# Patient Record
Sex: Male | Born: 1955 | Race: White | Hispanic: No | Marital: Married | Smoking: Never smoker
Health system: Southern US, Community
[De-identification: ages and names within clinical notes are randomized; demographics above are authoritative.]

## PROBLEM LIST (undated history)

## (undated) DIAGNOSIS — I77810 Thoracic aortic ectasia: Secondary | ICD-10-CM

## (undated) DIAGNOSIS — I7121 Aneurysm of the ascending aorta, without rupture: Secondary | ICD-10-CM

## (undated) DIAGNOSIS — H18519 Endothelial corneal dystrophy, unspecified eye: Secondary | ICD-10-CM

## (undated) DIAGNOSIS — F0390 Unspecified dementia without behavioral disturbance: Secondary | ICD-10-CM

## (undated) DIAGNOSIS — K759 Inflammatory liver disease, unspecified: Secondary | ICD-10-CM

## (undated) DIAGNOSIS — K219 Gastro-esophageal reflux disease without esophagitis: Secondary | ICD-10-CM

## (undated) DIAGNOSIS — Z8619 Personal history of other infectious and parasitic diseases: Secondary | ICD-10-CM

## (undated) DIAGNOSIS — B019 Varicella without complication: Secondary | ICD-10-CM

## (undated) DIAGNOSIS — H1851 Endothelial corneal dystrophy: Secondary | ICD-10-CM

## (undated) HISTORY — PX: NASAL SEPTUM SURGERY: SHX37

## (undated) HISTORY — DX: Thoracic aortic ectasia: I77.810

## (undated) HISTORY — DX: Gastro-esophageal reflux disease without esophagitis: K21.9

## (undated) HISTORY — DX: Personal history of other infectious and parasitic diseases: Z86.19

## (undated) HISTORY — DX: Inflammatory liver disease, unspecified: K75.9

## (undated) HISTORY — DX: Varicella without complication: B01.9

## (undated) HISTORY — PX: COLONOSCOPY: SHX174

## (undated) HISTORY — DX: Aneurysm of the ascending aorta, without rupture: I71.21

## (undated) HISTORY — DX: Endothelial corneal dystrophy, unspecified eye: H18.519

## (undated) HISTORY — DX: Endothelial corneal dystrophy: H18.51

---

## 1999-11-27 ENCOUNTER — Ambulatory Visit: Admission: RE | Admit: 1999-11-27 | Discharge: 1999-11-27 | Payer: Self-pay | Admitting: Otolaryngology

## 1999-11-27 ENCOUNTER — Encounter: Payer: Self-pay | Admitting: Pulmonary Disease

## 2000-07-02 ENCOUNTER — Encounter: Payer: Self-pay | Admitting: Pulmonary Disease

## 2004-12-10 ENCOUNTER — Ambulatory Visit: Payer: Self-pay | Admitting: Internal Medicine

## 2005-04-12 ENCOUNTER — Ambulatory Visit: Payer: Self-pay | Admitting: Internal Medicine

## 2005-04-24 ENCOUNTER — Ambulatory Visit: Payer: Self-pay | Admitting: Internal Medicine

## 2007-12-18 ENCOUNTER — Telehealth: Payer: Self-pay | Admitting: Internal Medicine

## 2008-01-09 ENCOUNTER — Encounter: Payer: Self-pay | Admitting: *Deleted

## 2008-01-09 DIAGNOSIS — G4733 Obstructive sleep apnea (adult) (pediatric): Secondary | ICD-10-CM | POA: Insufficient documentation

## 2008-01-09 DIAGNOSIS — K644 Residual hemorrhoidal skin tags: Secondary | ICD-10-CM | POA: Insufficient documentation

## 2008-01-09 DIAGNOSIS — J309 Allergic rhinitis, unspecified: Secondary | ICD-10-CM | POA: Insufficient documentation

## 2008-01-09 DIAGNOSIS — K219 Gastro-esophageal reflux disease without esophagitis: Secondary | ICD-10-CM | POA: Insufficient documentation

## 2009-02-09 ENCOUNTER — Ambulatory Visit: Payer: Self-pay | Admitting: Internal Medicine

## 2009-02-09 LAB — CONVERTED CEMR LAB
Basophils Absolute: 0 10*3/uL (ref 0.0–0.1)
CO2: 28 meq/L (ref 19–32)
Calcium: 9.4 mg/dL (ref 8.4–10.5)
Cholesterol: 185 mg/dL (ref 0–200)
Creatinine, Ser: 0.9 mg/dL (ref 0.4–1.5)
Eosinophils Absolute: 0.1 10*3/uL (ref 0.0–0.7)
HCT: 45.2 % (ref 39.0–52.0)
HDL: 53.1 mg/dL (ref >39.00)
Hemoglobin, Urine: NEGATIVE
Hemoglobin: 15.5 g/dL (ref 13.0–17.0)
Ketones, ur: NEGATIVE mg/dL
Lymphs Abs: 1.6 10*3/uL (ref 0.7–4.0)
MCHC: 34.3 g/dL (ref 30.0–36.0)
MCV: 91.6 fL (ref 78.0–100.0)
Monocytes Absolute: 0.6 10*3/uL (ref 0.1–1.0)
Neutro Abs: 5.1 10*3/uL (ref 1.4–7.7)
PSA: 0.35 ng/mL (ref 0.10–4.00)
Platelets: 252 10*3/uL (ref 150.0–400.0)
RDW: 12.5 % (ref 11.5–14.6)
Sodium: 140 meq/L (ref 135–145)
TSH: 0.64 microintl units/mL (ref 0.35–5.50)
Total CHOL/HDL Ratio: 3
Total Protein, Urine: NEGATIVE mg/dL
Triglycerides: 137 mg/dL (ref 0.0–149.0)
Urine Glucose: NEGATIVE mg/dL
Urobilinogen, UA: 0.2 (ref 0.0–1.0)

## 2009-02-11 ENCOUNTER — Encounter: Payer: Self-pay | Admitting: Internal Medicine

## 2010-06-04 ENCOUNTER — Telehealth (INDEPENDENT_AMBULATORY_CARE_PROVIDER_SITE_OTHER): Payer: Self-pay | Admitting: *Deleted

## 2010-07-10 ENCOUNTER — Ambulatory Visit: Payer: Self-pay | Admitting: Pulmonary Disease

## 2010-08-06 ENCOUNTER — Encounter: Payer: Self-pay | Admitting: Pulmonary Disease

## 2010-08-15 ENCOUNTER — Ambulatory Visit: Payer: Self-pay | Admitting: Pulmonary Disease

## 2010-08-21 ENCOUNTER — Telehealth: Payer: Self-pay | Admitting: Pulmonary Disease

## 2010-11-13 NOTE — Assessment & Plan Note (Signed)
Summary: home sleep study shows AHI of only 6/hr   Primary Provider/Referring Provider:  Illene Regulus MD    History of Present Illness: The pt underwent an unattended home sleep study with a type 3 device, including monitoring of airflow, effort, and pulse oximetry.  He had a flow and saturation evaluation period of at least 6hrs and .  His data and tracings have been reviewed with the folllowing findings:  1) the pt was found to have 8 obstructive apneas and 35 obstructive hypopneas, for an AHI of 6/hr 2) Low sat during the night of 85%, and only spent during the entire night less than or equal to 88%   Allergies: No Known Drug Allergies   Impression & Recommendations:  Problem # 1:  SLEEP APNEA, OBSTRUCTIVE (ICD-327.23)  the pt has minimal osa by his most recent study after a significant weight loss.  This does not represent a significant health risk to the pt, and would only treat aggressively if the pt is being impacted from a QOL standpoint.  Will call and discuss with him further.    Other Orders: Sleep Std Airflow/Heartrate and O2 SAT unattended (16109)  Appended Document: home sleep study shows AHI of only 6/hr discussed with wife (have been playing phone tag with pt).  She will discuss results with pt and let us know what he decides....Marland Kitchenno cpap, cpap, or dental appliance for snoring.  He has minimal sleep apnea since his weight loss.

## 2010-11-13 NOTE — Progress Notes (Signed)
Summary: dental apliance - rec to speak to [facility]  Phone Note Call from Patient Call back at [phone]   Caller: Patient Call For: [facility] Summary of Call: PT CALLING FOR SLEEP STUDY RESULTS. Initial call taken by: Rickard Patience,  August 21, 2010 9:55 AM  Follow-up for Phone Call        LMTCBx1, results given to pt wife by Dr. Shelle Iron. Carron Curie CMA  August 21, 2010 10:44 AM  Pt states that he is interested in trying dental appliance. I advised I will forward message to [facility] to make him aware and to advise on next step.Carron Curie CMA  August 21, 2010 11:03 AM    Additional Follow-up for Phone Call Additional follow up Details #1::        if patient wants to try dental appliance, can refer to Dr .Myrtis Ser here in town.  Just let me know if he wants to be referred. Additional Follow-up by: Barbaraann Share MD,  August 22, 2010 1:56 PM    Additional Follow-up for Phone Call Additional follow up Details #2::    Called, spoke with pt.  He states he is interested in pursuing the dental appliance but wouldl ike to learn more about it first.  He would like to speak to Dr. Shelle Iron personally re: questions about dental appliance and findings from sleep study.  Will forward message to [facility] .   Gweneth Dimitri RN  August 22, 2010 2:07 PM   Additional Follow-up for Phone Call Additional follow up Details #3:: Details for Additional Follow-up Action Taken: discussed with pt...would like to consider dental referral.  will send to dr Myrtis Ser for evaluation. Additional Follow-up by: Barbaraann Share MD,  August 23, 2010 7:15 PM

## 2010-11-13 NOTE — Progress Notes (Signed)
Summary: mask for cpap -needs ov  Phone Note Call from Patient Call back at [phone]   Caller: Patient Call For: Brandon Nielsen Summary of Call: Wants to get a mask for his cpap machine, pls advise. Initial call taken by: Darletta Moll,  June 04, 2010 2:48 PM  Follow-up for Phone Call        It has been greater than 3 yrs since pt has been seen by [facility]. LMOMTCB to inform pt he needs consult with [facility] to for supplies.  Gweneth Dimitri RN  June 04, 2010 3:15 PM  [facility] Gweneth Dimitri RN  June 04, 2010 4:28 PM  [facility] Gweneth Dimitri RN  June 05, 2010 3:52 PM   Additional Follow-up for Phone Call Additional follow up Details #1::        [facility] pt NA- LMOM advising pt that he needs to sched new consult with [facility] since we have not seen in over 3 yrs. Will sign msg since this is the 4th attempt. Additional Follow-up by: Vernie Murders,  June 06, 2010 8:59 AM

## 2010-11-13 NOTE — Assessment & Plan Note (Signed)
Summary: consult of management of osa   Primary Provider/Referring Provider:  Illene Regulus MD   CC:  pt was last seen 04/2000. Pt states ordered supplies offline. Pt states he needs a new mask. Pt states his machiine is doing okay. Marland Kitchen  History of Present Illness: the pt comes in today for evaluation of osa.  He was diagnosed with osa in 2001, and was started on cpap therapy.  He wore cpap compliantly, but really did not like the device.  Currently, he is using cpap about 40% of the time.  Over the last 2 years, he has lost about 30 pounds, and feels that he is sleeping much better.  He is still snoring when he doesn't wear his cpap, but his wife states that it is very much less than it has been since he has lost weight.  He typically goes to bed around 11pm, and arises at 7am to start his day.  He feels rested even when he doesn't wear cpap.  He does not feel that he is tired or sleepy during the day, and is satisfied with his alertness level.  He doesn't see a big difference whether he wears his machine or not.  He denies any sleepiness with driving.  His epworth score today is low at 3.  His current cpap machine is functioning well, but he is in need of a new mask.  Preventive Screening-Counseling & Management  Alcohol-Tobacco     Smoking Status: never  Current Medications (verified): 1)  None  Allergies (verified): No Known Drug Allergies  Past History:  Past Medical History: SLEEP APNEA, OBSTRUCTIVE (ICD-327.23)--AHI 40/hr in 2001 VIRAL MENINGITIS, HX OF 1989 (ICD-V12.49) ALLERGIC RHINITIS (ICD-477.9) HEMORRHOIDS, EXTERNAL (ICD-455.3) GASTROESOPHAGEAL REFLUX DISEASE (ICD-530.81)    Past Surgical History: Reviewed history from 01/09/2008 and no changes required. * Hx of UVULECTOMY AND SEPTOPLASTY  Family History: Reviewed history and no changes required. Mother--clotting disorders--anticardiolipid syndrome  Social History: Reviewed history and no changes required. married  loves with wife Patient never smoked.  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    Vital Signs:  Patient profile:   55 year old male Height:      74 inches Weight:      218.13 pounds BMI:     28.11 O2 Sat:      97 % on Room air Temp:     98.9 degrees F oral Pulse rate:   88 / minute BP sitting:   146 / 82  (left arm) Cuff size:   regular  Vitals Entered By: Carver Fila (July 10, 2010 9:27 AM)  O2 Flow:  Room air CC: pt was last seen 04/2000. Pt states ordered supplies offline. Pt states he needs a new mask. Pt states his machiine is doing okay.  Comments meds and allergies updated Phone number updated Carver Fila  July 10, 2010 9:27 AM    Physical Exam  General:  wd male in nad Eyes:  PERRLA and EOMI.   Nose:  deviated septum to left, but patent. Mouth:  mildly trimmed palate and uvula Neck:  no  jvd, tmg,LN Lungs:  clear to auscultation Heart:  rrr, no mrg Abdomen:  soft and nontender, bs+ Extremities:  no edema or cyanosis, pulses intact distally Neurologic:  alert and oriented, moves all 4.   Impression & Recommendations:  Problem # 1:  SLEEP APNEA, OBSTRUCTIVE (ICD-327.23)  the pt has a h/o moderate to severe osa dating back to 2001, but has lost significant weight since that time.  He is wearing cpap off and on, and does not like the device.  He feels that he sleeps well and has adequate daytime alertness even when he  doesn't wear the device.  He is still snoring, but that does not mean he still has sleep apnea.  At this point, I think he needs to have a repeat sleep study for 2 reasons.  One to make sure he still has sleep apnea since his weight loss, and second to get some idea as to its severity if still present.  If his AHI is low enough, he may just want to keep working on losing weight.  If it is still moderate or less, he may want to consider a dental appliance in light of his dislike of cpap.  Other Orders: New Patient Level IV (16109) Misc. Referral (Misc. Ref)  Patient Instructions: 1)  will do a home sleep study to see if you still have sleep apnea after your weight loss. 2)  consider dental appliance...do research at home. 3)  will call you with results of the repeat study.

## 2010-11-13 NOTE — Consult Note (Signed)
Summary: [facility]   Imported By: Sherian Rein 07/23/2010 13:11:30  _____________________________________________________________________  External Attachment:    Type:   Image     Comment:   External Document

## 2012-10-14 HISTORY — PX: CORNEAL TRANSPLANT: SHX108

## 2012-11-05 ENCOUNTER — Other Ambulatory Visit (INDEPENDENT_AMBULATORY_CARE_PROVIDER_SITE_OTHER): Payer: BC Managed Care – PPO

## 2012-11-05 ENCOUNTER — Encounter: Payer: Self-pay | Admitting: Internal Medicine

## 2012-11-05 ENCOUNTER — Ambulatory Visit (INDEPENDENT_AMBULATORY_CARE_PROVIDER_SITE_OTHER): Payer: BC Managed Care – PPO | Admitting: Internal Medicine

## 2012-11-05 VITALS — BP 118/78 | HR 82 | Temp 100.5°F | Resp 10 | Ht 75.0 in | Wt 232.0 lb

## 2012-11-05 DIAGNOSIS — Z125 Encounter for screening for malignant neoplasm of prostate: Secondary | ICD-10-CM

## 2012-11-05 DIAGNOSIS — Z23 Encounter for immunization: Secondary | ICD-10-CM

## 2012-11-05 DIAGNOSIS — G4733 Obstructive sleep apnea (adult) (pediatric): Secondary | ICD-10-CM

## 2012-11-05 DIAGNOSIS — Z0000 Encounter for general adult medical examination without abnormal findings: Secondary | ICD-10-CM

## 2012-11-05 DIAGNOSIS — K219 Gastro-esophageal reflux disease without esophagitis: Secondary | ICD-10-CM

## 2012-11-05 DIAGNOSIS — J111 Influenza due to unidentified influenza virus with other respiratory manifestations: Secondary | ICD-10-CM

## 2012-11-05 LAB — LIPID PANEL
HDL: 54.7 mg/dL (ref >39.00)
Total CHOL/HDL Ratio: 3
Triglycerides: 115 mg/dL (ref 0.0–149.0)
VLDL: 23 mg/dL (ref 0.0–40.0)

## 2012-11-05 LAB — COMPREHENSIVE METABOLIC PANEL
ALT: 49 U/L (ref 0–53)
AST: 35 U/L (ref 0–37)
Alkaline Phosphatase: 67 U/L (ref 39–117)
BUN: 10 mg/dL (ref 6–23)
Chloride: 104 mEq/L (ref 96–112)
Creatinine, Ser: 1 mg/dL (ref 0.4–1.5)
Total Bilirubin: 0.6 mg/dL (ref 0.3–1.2)

## 2012-11-05 MED ORDER — PROMETHAZINE-CODEINE 6.25-10 MG/5ML PO SYRP: 5.0000 mL | ORAL_SOLUTION | ORAL | Status: DC | PRN

## 2012-11-05 MED ORDER — OSELTAMIVIR PHOSPHATE 75 MG PO CAPS: 75.0000 mg | ORAL_CAPSULE | Freq: Two times a day (BID) | ORAL | Status: DC

## 2012-11-05 NOTE — Progress Notes (Signed)
Subjective:    Patient ID: Brandon Nielsen, male    DOB: Jun 08, 1956, 56 y.o.   MRN: [id number]  HPI Brandon Nielsen presents to reestablish for general medical care.  He reports the onset Tuesday of fever, aches, headache, cough - nonproductive. No N/V/D, mild stomach gurgling. No SOB. He did take a flu shot.  He has been well but had not seen for several years. He does report nocturia x 2, intermittent symptoms of hesitancy, decreased force of stream, post-void dribble.  Past Medical History  Diagnosis Date  . Chicken pox     as a child  . Hepatitis   . GERD (gastroesophageal reflux disease)     off meds - after loosing weight  . H/O infectious mononucleosis     in college   Past Surgical History  Procedure Date  . Nasal septum surgery     at 9 yrs of age   Family History  Problem Relation Age of Onset  . Cancer Mother     cervical cancer/non-hodgikins lymphoma  . Hypertension Mother   . Heart disease Father   . Hypertension Father    History   Social History  . Marital Status: Married    Spouse Name: N/A    Number of Children: 3  . Years of Education: 60   Occupational History  . lawyer   .     Social History Main Topics  . Smoking status: Never Smoker   . Smokeless tobacco: Never Used  . Alcohol Use: 2.0 oz/week    4 drink(s) per week  . Drug Use: No  . Sexually Active: Yes -- Male partner(s)   Other Topics Concern  . Not on file   Social History Narrative   HS[employer], 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 Married '77- 13/divorced. Married '95.  3 adult children - '82, '85, '86 (step daughter). Work - TEFL teacher.     No current outpatient prescriptions on file prior to visit.    Review of Systems Constitutional:  Negative for fever, chills, activity change and unexpected weight change. Except for current illness HEENT:  Negative for hearing loss, ear pain, congestion, neck stiffness and postnasal drip. Negative for sore  throat or swallowing problems. Negative for dental complaints.   Eyes: Negative for vision loss or change in visual acuity. Fuch's endothelial dystrophy Respiratory: Negative for chest tightness and wheezing. Negative for DOE.   Cardiovascular: Negative for chest pain or palpitations. No decreased exercise tolerance Gastrointestinal: No change in bowel habit. No bloating or gas. No reflux or indigestion Genitourinary: Negative for urgency, frequency, flank pain and difficulty urinating. prostatism - mild with nocturia and intermittent symptoms Musculoskeletal: Negative for myalgias, back pain, arthralgias and gait problem.  Neurological: Negative for dizziness, tremors, weakness and headaches.  Hematological: Negative for adenopathy.  Psychiatric/Behavioral: Negative for behavioral problems and dysphoric mood.       Objective:   Physical Exam Filed Vitals:   11/05/12 0950  BP: 118/78  Pulse: 82  Temp: 100.5 F (38.1 C)  Resp: 10   Wt Readings from Last 3 Encounters:  11/05/12 232 lb (105.235 kg)  07/10/10 218 lb 2.1 oz (98.944 kg)  04/12/05 232 lb (105.235 kg)   Gen'l- WNWD white man in no distress HEENT- no sinus tenderness, TMs normal Neck supple, no thyrogmegaly Nodes - no adenopathy Cor- 2+ radial pulse, RRR Pulm - CTAP Neuro - A&O x 3, CN II-XII normal, normal gait.  Lab Results  Component  Value Date   WBC 7.4 02/09/2009   HGB 15.5 02/09/2009   HCT 45.2 02/09/2009   PLT 252.0 02/09/2009   GLUCOSE 99 11/05/2012   CHOL 167 11/05/2012   TRIG 115.0 11/05/2012   HDL 54.70 11/05/2012   LDLCALC 89 11/05/2012   ALT 49 11/05/2012   AST 35 11/05/2012   NA 140 11/05/2012   K 4.7 11/05/2012   CL 104 11/05/2012   CREATININE 1.0 11/05/2012   BUN 10 11/05/2012   CO2 27 11/05/2012   TSH 0.64 02/09/2009   PSA 0.38 11/05/2012        Assessment & Plan:  Influenza-like illness - classic symptoms of fever, myalgia, mild cough, malaise  Plan Tamiflu 75 mg bid x 7. Tamiflu 75 mg daily x 10  for spouse  Supportive care.

## 2012-11-05 NOTE — Patient Instructions (Addendum)
Welcome Back.  Influenza like illness:  Tamiflu twice a day for 7 days  Phenergan with codeine for cough every 4 hours as needed  Tylenol 1,000 mg three times a day on schedule until feeling a lot better  Hydrate  Self-isolate - return to work when fever free for at least 24 hours//  Gen'l - Health:  Lab today - results will be in my report and may be accessed vi My Chart  Tdap today, may return for shingles when not ill.   Should have colonoscopy this year  Exercise please - at least 3 times a week.  Return as needed or in 18-24 months.   Influenza Facts Flu (influenza) is a contagious respiratory illness caused by the influenza viruses. It can cause mild to severe illness. While most healthy people recover from the flu without specific treatment and without complications, older people, young children, and people with certain health conditions are at higher risk for serious complications from the flu, including death. CAUSES    The flu virus is spread from person to person by respiratory droplets from coughing and sneezing.   A person can also become infected by touching an object or surface with a virus on it and then touching their mouth, eye or nose.   Adults may be able to infect others from 1 day before symptoms occur and up to 7 days after getting sick. So it is possible to give someone the flu even before you know you are sick and continue to infect others while you are sick.  SYMPTOMS    Fever (usually high).   Headache.   Tiredness (can be extreme).   Cough.   Sore throat.   Runny or stuffy nose.   Body aches.   Diarrhea and vomiting may also occur, particularly in children.   These symptoms are referred to as "flu-like symptoms". A lot of different illnesses, including the common cold, can have similar symptoms.  DIAGNOSIS    There are tests that can determine if you have the flu as long you are tested within the first 2 or 3 days of illness.   A doctor's  exam and additional tests may be needed to identify if you have a disease that is a complicating the flu.  RISKS AND COMPLICATIONS   Some of the complications caused by the flu include:  Bacterial pneumonia or progressive pneumonia caused by the flu virus.   Loss of body fluids (dehydration).   Worsening of chronic medical conditions, such as heart failure, asthma, or diabetes.   Sinus problems and ear infections.  HOME CARE INSTRUCTIONS    Seek medical care early on.   If you are at high risk from complications of the flu, consult your health-care provider as soon as you develop flu-like symptoms. Those at high risk for complications include:   People 65 years or older.   People with chronic medical conditions, including diabetes.   Pregnant women.   Young children.   Your caregiver may recommend use of an antiviral medication to help treat the flu.   If you get the flu, get plenty of rest, drink a lot of liquids, and avoid using alcohol and tobacco.   You can take over-the-counter medications to relieve the symptoms of the flu if your caregiver approves. (Never give aspirin to children or teenagers who have flu-like symptoms, particularly fever).  PREVENTION   The single best way to prevent the flu is to get a flu vaccine each fall. Other  measures that can help protect against the flu are:  Antiviral Medications   A number of antiviral drugs are approved for use in preventing the flu. These are prescription medications, and a doctor should be consulted before they are used.   Habits for Good Health   Cover your nose and mouth with a tissue when you cough or sneeze, throw the tissue away after you use it.   Wash your hands often with soap and water, especially after you cough or sneeze. If you are not near water, use an alcohol-based hand cleaner.   Avoid people who are sick.   If you get the flu, stay home from work or school. Avoid contact with other people so that you  do not make them sick, too.   Try not to touch your eyes, nose, or mouth as germs ore often spread this way.  IN CHILDREN, EMERGENCY WARNING SIGNS THAT NEED URGENT MEDICAL ATTENTION:  Fast breathing or trouble breathing.   Bluish skin color.   Not drinking enough fluids.   Not waking up or not interacting.   Being so irritable that the child does not want to be held.   Flu-like symptoms improve but then return with fever and worse cough.   Fever with a rash.  IN ADULTS, EMERGENCY WARNING SIGNS THAT NEED URGENT MEDICAL ATTENTION:  Difficulty breathing or shortness of breath.   Pain or pressure in the chest or abdomen.   Sudden dizziness.   Confusion.   Severe or persistent vomiting.  SEEK IMMEDIATE MEDICAL CARE IF:   You or someone you know is experiencing any of the symptoms above. When you arrive at the emergency center,report that you think you have the flu. You may be asked to wear a mask and/or sit in a secluded area to protect others from getting sick. MAKE SURE YOU:    Understand these instructions.   Monitor your condition.   Seek medical care if you are getting worse, or not improving.  Document Released: 10/03/2003 Document Revised: 12/23/2011 Document Reviewed: 06/29/2009 [facility] Patient Information 2013 [place], [place].

## 2012-11-08 DIAGNOSIS — Z0000 Encounter for general adult medical examination without abnormal findings: Secondary | ICD-10-CM | POA: Insufficient documentation

## 2012-11-08 NOTE — Assessment & Plan Note (Signed)
History of sleep apnea rated as moderate in '01. Use CPAP intermittently but in Nov '11 changed to dental appliance which is working well. He last saw Dr. Shelle Iron in Nov '11.  Plan Continue to use appliance. No indication for repeat sleep study.

## 2012-11-08 NOTE — Assessment & Plan Note (Signed)
Stable with no use of medication on a regular basis.

## 2012-11-08 NOTE — Assessment & Plan Note (Signed)
Interval history since last seen - OSA moderate-mild now treated with dental appliacnce otherwise no major illness, surgery or injury. Physical exam c/w ILI but otherwise normal. Lab results reviewed - normal lipids, normal glucose. He is due this spring for colonoscopy. Discussed pros and cons of prostate cancer screening (USPHCTF recommendations reviewed and ACU April '13 recommendations) and he requests evaluation at this time - PSA is low normal. Recommend repeat testing in 3 years. Immunizations - Tdap today; will return for Zostavax when recovered from ILI.  In summary - a very pleasant and generally healthy man. He is encouraged to develop a regular exercise program. He will return in 18-24 months, sooner as needed.

## 2012-11-28 ENCOUNTER — Other Ambulatory Visit: Payer: Self-pay

## 2013-08-19 ENCOUNTER — Other Ambulatory Visit: Payer: Self-pay

## 2014-07-01 ENCOUNTER — Telehealth: Payer: Self-pay | Admitting: Internal Medicine

## 2014-07-01 NOTE — Telephone Encounter (Signed)
Left message for patient to call back  

## 2014-07-04 NOTE — Telephone Encounter (Signed)
Patient is due for colonoscopy.  Last was 06/29/2003.  He will come in for a pre-visit on 08/16/14 4:30 and colon on 08/25/14 2:00

## 2014-07-29 ENCOUNTER — Other Ambulatory Visit: Payer: Self-pay

## 2014-08-16 ENCOUNTER — Ambulatory Visit (AMBULATORY_SURGERY_CENTER): Payer: Self-pay

## 2014-08-16 VITALS — Ht 75.0 in | Wt 235.8 lb

## 2014-08-16 DIAGNOSIS — Z83719 Family history of colon polyps, unspecified: Secondary | ICD-10-CM

## 2014-08-16 DIAGNOSIS — Z8371 Family history of colonic polyps: Secondary | ICD-10-CM

## 2014-08-16 NOTE — Progress Notes (Signed)
Per pt, no allergies to soy or egg products.Pt not taking any weight loss meds or using  O2 at home. 

## 2014-08-18 ENCOUNTER — Ambulatory Visit (INDEPENDENT_AMBULATORY_CARE_PROVIDER_SITE_OTHER): Payer: 59 | Admitting: Internal Medicine

## 2014-08-18 ENCOUNTER — Encounter: Payer: Self-pay | Admitting: Internal Medicine

## 2014-08-18 ENCOUNTER — Ambulatory Visit (INDEPENDENT_AMBULATORY_CARE_PROVIDER_SITE_OTHER): Payer: 59 | Admitting: Geriatric Medicine

## 2014-08-18 ENCOUNTER — Other Ambulatory Visit (INDEPENDENT_AMBULATORY_CARE_PROVIDER_SITE_OTHER): Payer: 59

## 2014-08-18 VITALS — BP 132/82 | HR 77 | Temp 99.2°F | Resp 14 | Ht 75.0 in | Wt 233.4 lb

## 2014-08-18 DIAGNOSIS — N400 Enlarged prostate without lower urinary tract symptoms: Secondary | ICD-10-CM

## 2014-08-18 DIAGNOSIS — Z23 Encounter for immunization: Secondary | ICD-10-CM

## 2014-08-18 DIAGNOSIS — G4733 Obstructive sleep apnea (adult) (pediatric): Secondary | ICD-10-CM

## 2014-08-18 DIAGNOSIS — K219 Gastro-esophageal reflux disease without esophagitis: Secondary | ICD-10-CM

## 2014-08-18 DIAGNOSIS — Z0000 Encounter for general adult medical examination without abnormal findings: Secondary | ICD-10-CM

## 2014-08-18 LAB — BASIC METABOLIC PANEL
BUN: 14 mg/dL (ref 6–23)
CALCIUM: 9.3 mg/dL (ref 8.4–10.5)
CHLORIDE: 105 meq/L (ref 96–112)
CO2: 24 mEq/L (ref 19–32)
CREATININE: 0.9 mg/dL (ref 0.4–1.5)
GFR: 88.13 mL/min (ref >60.00)
GLUCOSE: 91 mg/dL (ref 70–99)
Potassium: 4.8 mEq/L (ref 3.5–5.1)
Sodium: 139 mEq/L (ref 135–145)

## 2014-08-18 LAB — PSA: PSA: 0.41 ng/mL (ref 0.10–4.00)

## 2014-08-18 NOTE — Progress Notes (Signed)
Pre visit review using our clinic review tool, if applicable. No additional management support is needed unless otherwise documented below in the visit note. 

## 2014-08-18 NOTE — Patient Instructions (Signed)
We will watch for the results of your colonoscopy and we will check your blood work today. We will call you back with those results.  If you notice that your urinary symptoms are getting worse you can either come in for a visit or call our office and there are medications we can use to make the symptoms better.  Come back in about a year if everything is going well and you don't have any problems, please feel free to call our office sooner.   Benign Prostatic Hypertrophy The prostate gland is part of the reproductive system of men. A normal prostate is about the size and shape of a walnut. The prostate gland produces a fluid that is mixed with sperm to make semen. This gland surrounds the urethra and is located in front of the rectum and just below the bladder. The bladder is where urine is stored. The urethra is the tube through which urine passes from the bladder to get out of the body. The prostate grows as a man ages. An enlarged prostate not caused by cancer is called benign prostatic hypertrophy (BPH). An enlarged prostate can press on the urethra. This can make it harder to pass urine. In the early stages of enlargement, the bladder can get by with a narrowed urethra by forcing the urine through. If the problem gets worse, medical or surgical treatment may be required.  This condition should be followed by your health care provider. The accumulation of urine in the bladder can cause infection. Back pressure and infection can progress to bladder damage and kidney (renal) failure. If needed, your health care provider may refer you to a specialist in kidney and prostate disease (urologist). CAUSES  BPH is a common health problem in men older than 50 years. This condition is a normal part of aging. However, not all men will develop problems from this condition. If the enlargement grows away from the urethra, then there will not be any compression of the urethra and resistance to urine flow.If the  growth is toward the urethra and compresses it, you will experience difficulty urinating.  SYMPTOMS   Not able to completely empty your bladder.  Getting up often during the night to urinate.  Need to urinate frequently during the day.  Difficultly starting urine flow.  Decrease in size and strength of your urine stream.  Dribbling after urination.  Pain on urination (more common with infection).  Inability to pass urine. This needs immediate treatment.  The development of a urinary tract infection. DIAGNOSIS  These tests will help your health care provider understand your problem:  A thorough history and physical examination.  A urination history, with the number of times you urinate, the amounts of urine, the strength of the urine stream, and the feeling of emptiness or fullness after urinating.  A postvoid bladder scan that measures any amount of urine that may remain in your bladder after you finish urinating.  Digital rectal exam. In a rectal exam, your health care provider checks your prostate by putting a gloved, lubricated finger into your rectum to feel the back of your prostate gland. This exam detects the size of your gland and abnormal lumps or growths.  Exam of your urine (urinalysis).  Prostate specific antigen (PSA) screening. This is a blood test used to screen for prostate cancer.  Rectal ultrasonography. This test uses sound waves to electronically produce a picture of your prostate gland. TREATMENT  Once symptoms begin, your health care provider will monitor  your condition. Of the men with this condition, one third will have symptoms that stabilize, one third will have symptoms that improve, and one third will have symptoms that progress in the first year. Mild symptoms may not need treatment. Simple observation and yearly exams may be all that is required. Medicines and surgery are options for more severe problems. Your health care provider can help you make  an informed decision for what is best. Two classes of medicines are available for relief of prostate symptoms:  Medicines that shrink the prostate. This helps relieve symptoms. These medicines take time to work, and it may be months before any improvement is seen.  Uncommon side effects include problems with sexual function.  Medicines to relax the muscle of the prostate. This also relieves the obstruction by reducing any compression on the urethra.This group of medicines work much faster than those that reduce the size of the prostate gland. Usually, one can experience improvement in days to weeks..  Side effects can include dizziness, fatigue, lightheadedness, and retrograde ejaculation (diminished volume of ejaculate). Several types of surgical treatments are available for relief of prostate symptoms:  Transurethral resection of the prostate (TURP)--In this treatment, an instrument is inserted through opening at the tip of the penis. It is used to cut away pieces of the inner core of the prostate. The pieces are removed through the same opening of the penis. This removes the obstruction and helps get rid of the symptoms.  Transurethral incision (TUIP)--In this procedure, small cuts are made in the prostate. This lessens the prostates pressure on the urethra.  Transurethral microwave thermotherapy (TUMT)--This procedure uses microwaves to create heat. The heat destroys and removes a small amount of prostate tissue.  Transurethral needle ablation (TUNA)--This is a procedure that uses radio frequencies to do the same as TUMT.  Interstitial laser coagulation (ILC)--This is a procedure that uses a laser to do the same as TUMT and TUNA.  Transurethral electrovaporization (TUVP)--This is a procedure that uses electrodes to do the same as the procedures listed above. SEEK MEDICAL CARE IF:   You develop a fever.  There is unexplained back pain.  Symptoms are not helped by medicines  prescribed.  You develop side effects from the medicine you are taking.  Your urine becomes very dark or has a bad smell.  Your lower abdomen becomes distended and you have difficulty passing your urine. SEEK IMMEDIATE MEDICAL CARE IF:   You are suddenly unable to urinate. This is an emergency. You should be seen immediately.  There are large amounts of blood or clots in the urine.  Your urinary problems become unmanageable.  You develop lightheadedness, severe dizziness, or you feel faint.  You develop moderate to severe low back or flank pain.  You develop chills or fever. Document Released: 09/30/2005 Document Revised: 10/05/2013 Document Reviewed: 04/15/2013 [facility] Patient Information 2015 [place] [place]. This information is not intended to replace advice given to you by your health care provider. Make sure you discuss any questions you have with your health care provider.

## 2014-08-18 NOTE — Assessment & Plan Note (Signed)
Getting repeat colonoscopy next week, flu shot given today. He'll return for the shingles shot in 2-3 weeks. Checking PSA, BMP at today's visit. Encouraged exercise and a little bit of weight loss.

## 2014-08-18 NOTE — Assessment & Plan Note (Signed)
Still using dental appliance, advised that weight loss may help with his sleep apnea.

## 2014-08-18 NOTE — Assessment & Plan Note (Signed)
Takes Zantac when necessary.

## 2014-08-18 NOTE — Progress Notes (Signed)
   Subjective:    Patient ID: Brandon Nielsen, male    DOB: [birth year], 58 y.o.   MRN: [id number]  HPI The patient is a 58 year old man who comes in today to establish care. He has past medical history of mild GERD, moderate OSA, obesity. He does have repeat screening colonoscopy scheduled for next week. He feels like he is in fairly good health but just wants to make sure there is nothing else he could be doing. He does want his PSA checked. He denies chest pains, shortness of breath, abdominal pain, constipation, diarrhea, joint pain or swelling. He does occasionally have urinary symptoms of nocturia, decreased flow. These do not bother him to the point that he wishes to take medication for them.he is not exercising much lately and has gained about 5 pounds per year due to inactivity.  Review of Systems  Constitutional: Negative for fever, activity change, appetite change, fatigue and unexpected weight change.  HENT: Negative.   Eyes: Negative.   Respiratory: Negative for cough, chest tightness, shortness of breath and wheezing.   Cardiovascular: Negative for chest pain, palpitations and leg swelling.  Gastrointestinal: Negative for abdominal pain, diarrhea, constipation, blood in stool and abdominal distention.  Endocrine: Negative.   Genitourinary: Positive for enuresis.  Musculoskeletal: Negative.   Skin: Negative.   Neurological: Negative.   Psychiatric/Behavioral: Negative.       Objective:   Physical Exam  Constitutional: He is oriented to person, place, and time. He appears well-developed and well-nourished.  HENT:  Head: Normocephalic and atraumatic.  Eyes: EOM are normal.  Neck: Normal range of motion.  Cardiovascular: Normal rate and regular rhythm.   Pulmonary/Chest: Effort normal and breath sounds normal. No respiratory distress. He has no wheezes. He has no rales.  Abdominal: Soft. Bowel sounds are normal. He exhibits no distension. There is no tenderness. There is no  rebound.  Musculoskeletal: He exhibits no edema.  Neurological: He is alert and oriented to person, place, and time. Coordination normal.  Skin: Skin is warm and dry.   Filed Vitals:   08/18/14 1104  BP: 132/82  Pulse: 77  Temp: 99.2 F (37.3 C)  TempSrc: Oral  Resp: 14  Height: 6\' 3"  (1.905 m)  Weight: 233 lb 6.4 oz (105.87 kg)  SpO2: 97%      Assessment & Plan:  Patient got flu shot at today's visit.

## 2014-08-25 ENCOUNTER — Encounter: Payer: Self-pay | Admitting: Internal Medicine

## 2014-08-25 ENCOUNTER — Ambulatory Visit (AMBULATORY_SURGERY_CENTER): Payer: 59 | Admitting: Internal Medicine

## 2014-08-25 VITALS — BP 132/96 | HR 65 | Temp 96.7°F | Resp 14 | Ht 75.0 in | Wt 235.0 lb

## 2014-08-25 DIAGNOSIS — Z1211 Encounter for screening for malignant neoplasm of colon: Secondary | ICD-10-CM

## 2014-08-25 MED ORDER — SODIUM CHLORIDE 0.9 % IV SOLN: 500.0000 mL | INTRAVENOUS | Status: DC

## 2014-08-25 NOTE — Patient Instructions (Addendum)
The colonoscopy was normal!  Next routine colonoscopy in 10 years - 2025  I appreciate the opportunity to care for you. Carl E. Gessner, MD, FACG   YOU HAD AN ENDOSCOPIC PROCEDURE TODAY AT THE [facility]: Refer to the procedure report that was given to you for any specific questions about what was found during the examination.  If the procedure report does not answer your questions, please call your gastroenterologist to clarify.  If you requested that your care partner not be given the details of your procedure findings, then the procedure report has been included in a sealed envelope for you to review at your convenience later.  YOU SHOULD EXPECT: Some feelings of bloating in the abdomen. Passage of more gas than usual.  Walking can help get rid of the air that was put into your GI tract during the procedure and reduce the bloating. If you had a lower endoscopy (such as a colonoscopy or flexible sigmoidoscopy) you may notice spotting of blood in your stool or on the toilet paper. If you underwent a bowel prep for your procedure, then you may not have a normal bowel movement for a few days.  DIET: Your first meal following the procedure should be a light meal and then it is ok to progress to your normal diet.  A half-sandwich or bowl of soup is an example of a good first meal.  Heavy or fried foods are harder to digest and may make you feel nauseous or bloated.  Likewise meals heavy in dairy and vegetables can cause extra gas to form and this can also increase the bloating.  Drink plenty of fluids but you should avoid alcoholic beverages for 24 hours.  ACTIVITY: Your care partner should take you home directly after the procedure.  You should plan to take it easy, moving slowly for the rest of the day.  You can resume normal activity the day after the procedure however you should NOT DRIVE or use heavy machinery for 24 hours (because of the sedation medicines used during the test).     SYMPTOMS TO REPORT IMMEDIATELY: A gastroenterologist can be reached at any hour.  During normal business hours, 8:30 AM to 5:00 PM Monday through Friday, call [phone].  After hours and on weekends, please call the GI answering service at [phone] who will take a message and have the physician on call contact you.   Following lower endoscopy (colonoscopy or flexible sigmoidoscopy):  Excessive amounts of blood in the stool  Significant tenderness or worsening of abdominal pains  Swelling of the abdomen that is new, acute  Fever of 100F or higher  FOLLOW UP: If any biopsies were taken you will be contacted by phone or by letter within the next 1-3 weeks.  Call your gastroenterologist if you have not heard about the biopsies in 3 weeks.  Our staff will call the home number listed on your records the next business day following your procedure to check on you and address any questions or concerns that you may have at that time regarding the information given to you following your procedure. This is a courtesy call and so if there is no answer at the home number and we have not heard from you through the emergency physician on call, we will assume that you have returned to your regular daily activities without incident.  SIGNATURES/CONFIDENTIALITY: You and/or your care partner have signed paperwork which will be entered into your electronic medical record.  These   signatures attest to the fact that that the information above on your After Visit Summary has been reviewed and is understood.  Full responsibility of the confidentiality of this discharge information lies with you and/or your care-partner. 

## 2014-08-25 NOTE — Op Note (Signed)
[facility] [address] [place], [id number]   COLONOSCOPY PROCEDURE REPORT  PATIENT: Brandon CombesBorum, Hartwell L  MR#: [id number] BIRTHDATE: [birth year] , 57  yrs. old GENDER: male ENDOSCOPIST: Iva Booparl E Sascha Baugher, MD, [facility]AC[facility] PROCEDURE DATE:  08/25/2014 PROCEDURE:   Colonoscopy, screening First Screening Colonoscopy - Avg.  risk and is 50 yrs.  old or older - No.  Prior Negative Screening - Now for repeat screening. 10 or more years since last screening  History of Adenoma - Now for follow-up colonoscopy & has been > or = to 3 yrs.  N/A  Polyps Removed Today? No.  Polyps Removed Today? No.  Recommend repeat exam, <10 yrs? Polyps Removed Today? No.  Recommend repeat exam, <10 yrs? No. ASA CLASS:   Class II INDICATIONS:average risk for colorectal cancer. MEDICATIONS: Propofol 240 mg IV and Monitored anesthesia care  DESCRIPTION OF PROCEDURE:   After the risks benefits and alternatives of the procedure were thoroughly explained, informed consent was obtained.  The digital rectal exam revealed no abnormalities of the rectum, revealed the prostate was not enlarged, and revealed no prostatic nodules.   The LB [id number]  endoscope was introduced through the anus and advanced to the cecum, which was identified by both the appendix and ileocecal valve. No adverse events experienced.   The quality of the prep was good, using MiraLax  The instrument was then slowly withdrawn as the colon was fully examined.      COLON FINDINGS: A normal appearing cecum, ileocecal valve, and appendiceal orifice were identified.  The ascending, transverse, descending, sigmoid colon, and rectum appeared unremarkable. Retroflexed right colon and rectal views revealed no abnormalities. The time to cecum=1 minutes 48 seconds.  Withdrawal time=7 minutes 34 seconds.  The scope was withdrawn and the procedure completed. COMPLICATIONS: There were no immediate complications.  ENDOSCOPIC  IMPRESSION: Normal colonoscopy - good prep  RECOMMENDATIONS: Repeat colonoscopy 10 years - 2025  eSigned:  Iva Booparl E Voncille Simm, MD, [facility]ACG 08/25/2014 2:16 PM   cc: The Patient

## 2014-08-25 NOTE — Progress Notes (Signed)
Report to PACU, RN, vss, BBS= Clear.  

## 2014-08-26 ENCOUNTER — Telehealth: Payer: Self-pay

## 2014-08-26 NOTE — Telephone Encounter (Signed)
No answer, left vm 

## 2015-10-10 ENCOUNTER — Ambulatory Visit (INDEPENDENT_AMBULATORY_CARE_PROVIDER_SITE_OTHER): Payer: 59

## 2015-10-10 DIAGNOSIS — Z23 Encounter for immunization: Secondary | ICD-10-CM | POA: Diagnosis not present

## 2015-11-03 ENCOUNTER — Encounter: Payer: Self-pay | Admitting: Nurse Practitioner

## 2015-11-03 ENCOUNTER — Ambulatory Visit (INDEPENDENT_AMBULATORY_CARE_PROVIDER_SITE_OTHER): Payer: 59 | Admitting: Nurse Practitioner

## 2015-11-03 VITALS — BP 168/84 | HR 68 | Temp 99.1°F | Resp 16 | Ht 75.0 in | Wt 236.0 lb

## 2015-11-03 DIAGNOSIS — J069 Acute upper respiratory infection, unspecified: Secondary | ICD-10-CM

## 2015-11-03 MED ORDER — HYDROCOD POLST-CPM POLST ER 10-8 MG/5ML PO SUER: 5.0000 mL | Freq: Every evening | ORAL | Status: DC | PRN

## 2015-11-03 MED ORDER — AZITHROMYCIN 250 MG PO TABS: ORAL_TABLET | ORAL | Status: DC

## 2015-11-03 NOTE — Progress Notes (Signed)
Patient ID: Brandon Nielsen, male    DOB: 1956-06-22  Age: 59 y.o. MRN: [id number]  CC: Cough   HPI Brandon Nielsen presents for Cough, sore throat, congestion x 1 week.   1) More productive in the morning Maxillary sinus pressure   ST is intermittent  Sick contacts- Wife, mother has pneumonia   Treatment to date:  Saline nasal spray Delsym  Tylenol  Cough Drops  Humidifier   History Brandon Nielsen has a past medical history of Chicken pox; Hepatitis; GERD (gastroesophageal reflux disease); H/O infectious mononucleosis; and Fuch's endothelial dystrophy.   He has past surgical history that includes Nasal septum surgery; Corneal transplant (2014); and Colonoscopy.   His family history includes Cancer in his mother; Colon polyps in his mother; Heart disease in his father; Hypertension in his father and mother.He reports that he has never smoked. He has never used smokeless tobacco. He reports that he drinks about 4.8 - 6.0 oz of alcohol per week. He reports that he does not use illicit drugs.  Outpatient Prescriptions Prior to Visit  Medication Sig Dispense Refill  . prednisoLONE acetate (PRED MILD) 0.12 % ophthalmic suspension Place 1 drop into both eyes daily.    . ranitidine (ZANTAC) 150 MG capsule Take 150 mg by mouth as needed for heartburn.     No facility-administered medications prior to visit.    ROS Review of Systems  Constitutional: Negative for fever, chills, diaphoresis and fatigue.  HENT: Positive for congestion, postnasal drip, rhinorrhea, sinus pressure, sneezing and sore throat. Negative for ear pain.   Eyes: Negative for pain, discharge, itching and visual disturbance.  Respiratory: Positive for cough. Negative for chest tightness, shortness of breath and wheezing.   Cardiovascular: Negative for chest pain, palpitations and leg swelling.  Gastrointestinal: Negative for nausea, vomiting and diarrhea.  Skin: Negative for rash.  Neurological: Negative for dizziness and  headaches.    Objective:  BP 168/84 mmHg  Pulse 68  Temp(Src) 99.1 F (37.3 C) (Oral)  Resp 16  Ht  (1.905 m)  Wt 236 lb (107.049 kg)  BMI 29.50 kg/m2  SpO2 93%  Physical Exam  Constitutional: He is oriented to person, place, and time. He appears well-developed and well-nourished. No distress.  HENT:  Head: Normocephalic and atraumatic.  Right Ear: External ear normal.  Left Ear: External ear normal.  Mouth/Throat: Oropharynx is clear and moist. No oropharyngeal exudate.  TM's clear bilaterally  Eyes: EOM are normal. Pupils are equal, round, and reactive to light. Right eye exhibits no discharge. Left eye exhibits no discharge. No scleral icterus.  Neck: Normal range of motion. Neck supple.  Cardiovascular: Normal rate, regular rhythm and normal heart sounds.  Exam reveals no gallop and no friction rub.   No murmur heard. Pulmonary/Chest: Effort normal and breath sounds normal. No respiratory distress. He has no wheezes. He has no rales. He exhibits no tenderness.  Lymphadenopathy:    He has no cervical adenopathy.  Neurological: He is alert and oriented to person, place, and time.  Skin: Skin is warm and dry. No rash noted. He is not diaphoretic.  Psychiatric: He has a normal mood and affect. His behavior is normal. Judgment and thought content normal.   Assessment & Plan:   Brandon Nielsen was seen today for cough.  Diagnoses and all orders for this visit:  Acute URI  Other orders -     azithromycin (ZITHROMAX) 250 MG tablet; Take 2 tablets by mouth on day 1, take 1 tablet by  mouth each day after for 4 days. -     chlorpheniramine-HYDROcodone (TUSSIONEX PENNKINETIC ER) 10-8 MG/5ML SUER; Take 5 mLs by mouth at bedtime as needed for cough.   I am having Brandon Nielsen start on azithromycin and chlorpheniramine-HYDROcodone. I am also having him maintain his ranitidine and prednisoLONE acetate.  Meds ordered this encounter  Medications  . azithromycin (ZITHROMAX) 250 MG tablet     Sig: Take 2 tablets by mouth on day 1, take 1 tablet by mouth each day after for 4 days.    Dispense:  6 each    Refill:  0    Order Specific Question:  Supervising Provider    Answer:  Duncan Dull L [[id number]]  . chlorpheniramine-HYDROcodone (TUSSIONEX PENNKINETIC ER) 10-8 MG/5ML SUER    Sig: Take 5 mLs by mouth at bedtime as needed for cough.    Dispense:  115 mL    Refill:  0    Order Specific Question:  Supervising Provider    Answer:  Sherlene Shams [[id number]]     Follow-up: Return if symptoms worsen or fail to improve.

## 2015-11-03 NOTE — Progress Notes (Signed)
Pre visit review using our clinic review tool, if applicable. No additional management support is needed unless otherwise documented below in the visit note. 

## 2015-11-03 NOTE — Patient Instructions (Signed)
5 mL (1 teaspoon) of cough syrup. Don't drive or make important decisions while taking this....just sleep and rest.   Z-pack as needed

## 2015-11-03 NOTE — Assessment & Plan Note (Addendum)
New onset Will treat conservatively due to probable viral nature Tussionex Printed and signed, handed to pt to take to eRx Instructions for taking done verbally and on AVS- caution for drowsy effects Mucinex plain and benadryl at night encouraged  Antibiotic prescription sent to pharmacy in case of no improvement or fever within 48 hours. Pt verbalized understanding of need to hold.  FU prn worsening/failure to improve.

## 2016-04-09 ENCOUNTER — Telehealth: Payer: Self-pay | Admitting: Internal Medicine

## 2016-04-09 NOTE — Telephone Encounter (Signed)
Patient is requesting shingles vac.  Please advise if ok to give?  Patient will verify with insurance on the coverage part.

## 2016-04-10 NOTE — Telephone Encounter (Signed)
Has not been seen since 2015, would recommend he can get it at his physical

## 2016-04-10 NOTE — Telephone Encounter (Signed)
Got scheduled  °

## 2016-04-22 ENCOUNTER — Other Ambulatory Visit (INDEPENDENT_AMBULATORY_CARE_PROVIDER_SITE_OTHER): Payer: BLUE CROSS/BLUE SHIELD

## 2016-04-22 ENCOUNTER — Encounter: Payer: Self-pay | Admitting: Internal Medicine

## 2016-04-22 ENCOUNTER — Ambulatory Visit (INDEPENDENT_AMBULATORY_CARE_PROVIDER_SITE_OTHER): Payer: BLUE CROSS/BLUE SHIELD | Admitting: Internal Medicine

## 2016-04-22 VITALS — BP 152/104 | HR 80 | Temp 98.8°F | Resp 12 | Ht 75.0 in | Wt 233.0 lb

## 2016-04-22 DIAGNOSIS — Z23 Encounter for immunization: Secondary | ICD-10-CM | POA: Diagnosis not present

## 2016-04-22 DIAGNOSIS — K219 Gastro-esophageal reflux disease without esophagitis: Secondary | ICD-10-CM | POA: Diagnosis not present

## 2016-04-22 DIAGNOSIS — Z0000 Encounter for general adult medical examination without abnormal findings: Secondary | ICD-10-CM

## 2016-04-22 DIAGNOSIS — Z418 Encounter for other procedures for purposes other than remedying health state: Secondary | ICD-10-CM | POA: Diagnosis not present

## 2016-04-22 DIAGNOSIS — Z299 Encounter for prophylactic measures, unspecified: Secondary | ICD-10-CM

## 2016-04-22 LAB — COMPREHENSIVE METABOLIC PANEL
ALK PHOS: 73 U/L (ref 39–117)
ALT: 18 U/L (ref 0–53)
AST: 18 U/L (ref 0–37)
Albumin: 4.1 g/dL (ref 3.5–5.2)
BILIRUBIN TOTAL: 0.6 mg/dL (ref 0.2–1.2)
BUN: 21 mg/dL (ref 6–23)
CALCIUM: 9.8 mg/dL (ref 8.4–10.5)
CO2: 29 meq/L (ref 19–32)
CREATININE: 0.96 mg/dL (ref 0.40–1.50)
Chloride: 104 mEq/L (ref 96–112)
GFR: 84.48 mL/min (ref >60.00)
GLUCOSE: 107 mg/dL — AB (ref 70–99)
Potassium: 5.1 mEq/L (ref 3.5–5.1)
Sodium: 137 mEq/L (ref 135–145)
TOTAL PROTEIN: 6.6 g/dL (ref 6.0–8.3)

## 2016-04-22 LAB — CBC
HCT: 43.8 % (ref 39.0–52.0)
Hemoglobin: 15.1 g/dL (ref 13.0–17.0)
MCHC: 34.6 g/dL (ref 30.0–36.0)
MCV: 88.5 fl (ref 78.0–100.0)
Platelets: 266 10*3/uL (ref 150.0–400.0)
RBC: 4.95 Mil/uL (ref 4.22–5.81)
RDW: 13.8 % (ref 11.5–15.5)
WBC: 8.3 10*3/uL (ref 4.0–10.5)

## 2016-04-22 LAB — LIPID PANEL
Cholesterol: 194 mg/dL (ref 0–200)
HDL: 60.8 mg/dL (ref >39.00)
LDL Cholesterol: 108 mg/dL — ABNORMAL HIGH (ref 0–99)
NONHDL: 133.68
Total CHOL/HDL Ratio: 3
Triglycerides: 126 mg/dL (ref 0.0–149.0)
VLDL: 25.2 mg/dL (ref 0.0–40.0)

## 2016-04-22 LAB — PSA: PSA: 0.58 ng/mL (ref 0.10–4.00)

## 2016-04-22 LAB — HEPATITIS C ANTIBODY: HCV AB: NEGATIVE

## 2016-04-22 NOTE — Assessment & Plan Note (Signed)
Uses zantac prn and avoids trigger foods mostly.

## 2016-04-22 NOTE — Progress Notes (Signed)
   Subjective:    Patient ID: Brandon Nielsen, male    DOB: July 21, 1956, 60 y.o.   MRN: [id number]  HPI The patient is a 60 YO man coming in for wellness. No new concerns. Walks his dog but not exercising. 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 Non-smoker.   PMH, [facility], social history reviewed and updated.   Review of Systems  Constitutional: Negative for fever, activity change, appetite change, fatigue and unexpected weight change.  HENT: Negative.   Eyes: Negative.   Respiratory: Negative for cough, chest tightness, shortness of breath and wheezing.   Cardiovascular: Negative for chest pain, palpitations and leg swelling.  Gastrointestinal: Negative for abdominal pain, diarrhea, constipation, blood in stool and abdominal distention.  Endocrine: Negative.   Musculoskeletal: Positive for arthralgias. Negative for myalgias, back pain, joint swelling and gait problem.  Skin: Negative.   Neurological: Negative.   Psychiatric/Behavioral: Negative.       Objective:   Physical Exam  Constitutional: He is oriented to person, place, and time. He appears well-developed and well-nourished.  HENT:  Head: Normocephalic and atraumatic.  Eyes: EOM are normal.  Neck: Normal range of motion.  Cardiovascular: Normal rate and regular rhythm.   Pulmonary/Chest: Effort normal and breath sounds normal. No respiratory distress. He has no wheezes. He has no rales.  Abdominal: Soft. Bowel sounds are normal. He exhibits no distension. There is no tenderness. There is no rebound.  Musculoskeletal: He exhibits no edema.  Neurological: He is alert and oriented to person, place, and time. Coordination normal.  Skin: Skin is warm and dry.  Psychiatric: He has a normal mood and affect.   Filed Vitals:   04/22/16 1052 04/22/16 1132  BP: 158/100 152/104  Pulse: 80   Temp: 98.8 F (37.1 C)   TempSrc: Oral   Resp: 12   Height: 6\' 3"  (1.905 m)   Weight: 233 lb  (105.688 kg)   SpO2: 97%    EKG: Rate 82, sinus, axis normal, intervals normal, no st or t wave changes, no old to compare    Assessment & Plan:  Shingles vaccine given at visit.

## 2016-04-22 NOTE — Assessment & Plan Note (Signed)
BP elevated mildly and increased NSAID usage for knee pain which he will stop. Needs recheck at visit and needs to increase exercise. Checking labs. EKG normal. Colonoscopy up to date. Shingles given at visit. Immunizations up to date. Counseled on not smoking and skin safety as well as the dangers of distracted driving. Given screening recommendations.

## 2016-04-22 NOTE — Patient Instructions (Signed)
We have given you the shingles shot today.  The EKG looks normal as expected.   We will check the blood work and send the [facility].   Remember to get active. Exercising 30 minutes a day 4 times per week is enough to keep your heart and lungs healthy.   Health Maintenance, Male A healthy lifestyle and preventative care can promote health and wellness.  Maintain regular health, dental, and eye exams.  Eat a healthy diet. Foods like vegetables, fruits, whole grains, low-fat dairy products, and lean protein foods contain the nutrients you need and are low in calories. Decrease your intake of foods high in solid fats, added sugars, and salt. Get information about a proper diet from your health care provider, if necessary.  Regular physical exercise is one of the most important things you can do for your health. Most adults should get at least 150 minutes of moderate-intensity exercise (any activity that increases your heart rate and causes you to sweat) each week. In addition, most adults need muscle-strengthening exercises on 2 or more days a week.   Maintain a healthy weight. The body mass index (BMI) is a screening tool to identify possible weight problems. It provides an estimate of body fat based on height and weight. Your health care provider can find your BMI and can help you achieve or maintain a healthy weight. For males 20 years and older:  A BMI below 18.5 is considered underweight.  A BMI of 18.5 to 24.9 is normal.  A BMI of 25 to 29.9 is considered overweight.  A BMI of 30 and above is considered obese.  Maintain normal blood lipids and cholesterol by exercising and minimizing your intake of saturated fat. Eat a balanced diet with plenty of fruits and vegetables. Blood tests for lipids and cholesterol should begin at age 62 and be repeated every 5 years. If your lipid or cholesterol levels are high, you are over age 62, or you are at high risk for heart disease, you may  need your cholesterol levels checked more frequently.Ongoing high lipid and cholesterol levels should be treated with medicines if diet and exercise are not working.  If you smoke, find out from your health care provider how to quit. If you do not use tobacco, do not start.  Lung cancer screening is recommended for adults aged 55-80 years who are at high risk for developing lung cancer because of a history of smoking. A yearly low-dose CT scan of the lungs is recommended for people who have at least a 30-pack-year history of smoking and are current smokers or have quit within the past 15 years. A pack year of smoking is smoking an average of 1 pack of cigarettes a day for 1 year (for example, a 30-pack-year history of smoking could mean smoking 1 pack a day for 30 years or 2 packs a day for 15 years). Yearly screening should continue until the smoker has stopped smoking for at least 15 years. Yearly screening should be stopped for people who develop a health problem that would prevent them from having lung cancer treatment.  If you choose to drink alcohol, do not have more than 2 drinks per day. One drink is considered to be 12 oz (360 mL) of beer, 5 oz (150 mL) of wine, or 1.5 oz (45 mL) of liquor.  Avoid the use of street drugs. Do not share needles with anyone. Ask for help if you need support or instructions about stopping the  use of drugs.  High blood pressure causes heart disease and increases the risk of stroke. High blood pressure is more likely to develop in:  People who have blood pressure in the end of the normal range (100-139/85-89 mm Hg).  People who are overweight or obese.  People who are African American.  If you are 26-46 years of age, have your blood pressure checked every 3-5 years. If you are 54 years of age or older, have your blood pressure checked every year. You should have your blood pressure measured twice--once when you are at a hospital or clinic, and once when you are  not at a hospital or clinic. Record the average of the two measurements. To check your blood pressure when you are not at a hospital or clinic, you can use:  An automated blood pressure machine at a pharmacy.  A home blood pressure monitor.  If you are 63-93 years old, ask your health care provider if you should take aspirin to prevent heart disease.  Diabetes screening involves taking a blood sample to check your fasting blood sugar level. This should be done once every 3 years after age 3 if you are at a normal weight and without risk factors for diabetes. Testing should be considered at a younger age or be carried out more frequently if you are overweight and have at least 1 risk factor for diabetes.  Colorectal cancer can be detected and often prevented. Most routine colorectal cancer screening begins at the age of 77 and continues through age 34. However, your health care provider may recommend screening at an earlier age if you have risk factors for colon cancer. On a yearly basis, your health care provider may provide home test kits to check for hidden blood in the stool. A small camera at the end of a tube may be used to directly examine the colon (sigmoidoscopy or colonoscopy) to detect the earliest forms of colorectal cancer. Talk to your health care provider about this at age 5 when routine screening begins. A direct exam of the colon should be repeated every 5-10 years through age 21, unless early forms of precancerous polyps or small growths are found.  People who are at an increased risk for hepatitis B should be screened for this virus. You are considered at high risk for hepatitis B if:  You were born in a country where hepatitis B occurs often. Talk with your health care provider about which countries are considered high risk.  Your parents were born in a high-risk country and you have not received a shot to protect against hepatitis B (hepatitis B vaccine).  You have HIV or  AIDS.  You use needles to inject street drugs.  You live with, or have sex with, someone who has hepatitis B.  You are a man who has sex with other men (MSM).  You get hemodialysis treatment.  You take certain medicines for conditions like cancer, organ transplantation, and autoimmune conditions.  Hepatitis C blood testing is recommended for all people born from [birth year] through 1965 and any individual with known risk factors for hepatitis C.  Healthy men should no longer receive prostate-specific antigen (PSA) blood tests as part of routine cancer screening. Talk to your health care provider about prostate cancer screening.  Testicular cancer screening is not recommended for adolescents or adult males who have no symptoms. Screening includes self-exam, a health care provider exam, and other screening tests. Consult with your health care provider about any symptoms  you have or any concerns you have about testicular cancer.  Practice safe sex. Use condoms and avoid high-risk sexual practices to reduce the spread of sexually transmitted infections (STIs).  You should be screened for STIs, including gonorrhea and chlamydia if:  You are sexually active and are younger than 24 years.  You are older than 24 years, and your health care provider tells you that you are at risk for this type of infection.  Your sexual activity has changed since you were last screened, and you are at an increased risk for chlamydia or gonorrhea. Ask your health care provider if you are at risk.  If you are at risk of being infected with HIV, it is recommended that you take a prescription medicine daily to prevent HIV infection. This is called pre-exposure prophylaxis (PrEP). You are considered at risk if:  You are a man who has sex with other men (MSM).  You are a heterosexual man who is sexually active with multiple partners.  You take drugs by injection.  You are sexually active with a partner who has  HIV.  Talk with your health care provider about whether you are at high risk of being infected with HIV. If you choose to begin PrEP, you should first be tested for HIV. You should then be tested every 3 months for as long as you are taking PrEP.  Use sunscreen. Apply sunscreen liberally and repeatedly throughout the day. You should seek shade when your shadow is shorter than you. Protect yourself by wearing long sleeves, pants, a wide-brimmed hat, and sunglasses year round whenever you are outdoors.  Tell your health care provider of new moles or changes in moles, especially if there is a change in shape or color. Also, tell your health care provider if a mole is larger than the size of a pencil eraser.  A one-time screening for abdominal aortic aneurysm (AAA) and surgical repair of large AAAs by ultrasound is recommended for men aged 61-75 years who are current or former smokers.  Stay current with your vaccines (immunizations).   This information is not intended to replace advice given to you by your health care provider. Make sure you discuss any questions you have with your health care provider.   Document Released: 03/28/2008 Document Revised: 10/21/2014 Document Reviewed: 02/25/2011 Elsevier Interactive Patient Education [employer].

## 2016-04-22 NOTE — Progress Notes (Signed)
Pre visit review using our clinic review tool, if applicable. No additional management support is needed unless otherwise documented below in the visit note. 

## 2016-04-22 NOTE — Addendum Note (Signed)
Addended by: Conception ChancyOBERSON, Terran Klinke R on: 04/22/2016 01:46 PM   Modules accepted: Orders

## 2016-08-01 DIAGNOSIS — S83241A Other tear of medial meniscus, current injury, right knee, initial encounter: Secondary | ICD-10-CM | POA: Diagnosis not present

## 2016-08-03 DIAGNOSIS — M25561 Pain in right knee: Secondary | ICD-10-CM | POA: Diagnosis not present

## 2016-09-04 ENCOUNTER — Ambulatory Visit (INDEPENDENT_AMBULATORY_CARE_PROVIDER_SITE_OTHER): Payer: BLUE CROSS/BLUE SHIELD

## 2016-09-04 DIAGNOSIS — Z23 Encounter for immunization: Secondary | ICD-10-CM

## 2016-09-04 DIAGNOSIS — M25561 Pain in right knee: Secondary | ICD-10-CM | POA: Diagnosis not present

## 2016-10-16 DIAGNOSIS — M2241 Chondromalacia patellae, right knee: Secondary | ICD-10-CM | POA: Diagnosis not present

## 2016-10-16 DIAGNOSIS — G8918 Other acute postprocedural pain: Secondary | ICD-10-CM | POA: Diagnosis not present

## 2016-10-16 DIAGNOSIS — S83281A Other tear of lateral meniscus, current injury, right knee, initial encounter: Secondary | ICD-10-CM | POA: Diagnosis not present

## 2016-10-16 DIAGNOSIS — Y999 Unspecified external cause status: Secondary | ICD-10-CM | POA: Diagnosis not present

## 2016-10-16 DIAGNOSIS — S83241A Other tear of medial meniscus, current injury, right knee, initial encounter: Secondary | ICD-10-CM | POA: Diagnosis not present

## 2016-10-22 DIAGNOSIS — S83241D Other tear of medial meniscus, current injury, right knee, subsequent encounter: Secondary | ICD-10-CM | POA: Diagnosis not present

## 2016-10-22 DIAGNOSIS — S83281D Other tear of lateral meniscus, current injury, right knee, subsequent encounter: Secondary | ICD-10-CM | POA: Diagnosis not present

## 2016-12-12 DIAGNOSIS — S83281D Other tear of lateral meniscus, current injury, right knee, subsequent encounter: Secondary | ICD-10-CM | POA: Diagnosis not present

## 2016-12-12 DIAGNOSIS — M6281 Muscle weakness (generalized): Secondary | ICD-10-CM | POA: Diagnosis not present

## 2016-12-12 DIAGNOSIS — S83241D Other tear of medial meniscus, current injury, right knee, subsequent encounter: Secondary | ICD-10-CM | POA: Diagnosis not present

## 2016-12-12 DIAGNOSIS — M25611 Stiffness of right shoulder, not elsewhere classified: Secondary | ICD-10-CM | POA: Diagnosis not present

## 2016-12-25 DIAGNOSIS — M25661 Stiffness of right knee, not elsewhere classified: Secondary | ICD-10-CM | POA: Diagnosis not present

## 2016-12-25 DIAGNOSIS — M6281 Muscle weakness (generalized): Secondary | ICD-10-CM | POA: Diagnosis not present

## 2016-12-25 DIAGNOSIS — S83241D Other tear of medial meniscus, current injury, right knee, subsequent encounter: Secondary | ICD-10-CM | POA: Diagnosis not present

## 2016-12-25 DIAGNOSIS — S83281D Other tear of lateral meniscus, current injury, right knee, subsequent encounter: Secondary | ICD-10-CM | POA: Diagnosis not present

## 2017-01-15 DIAGNOSIS — H1013 Acute atopic conjunctivitis, bilateral: Secondary | ICD-10-CM | POA: Diagnosis not present

## 2017-05-14 DIAGNOSIS — D225 Melanocytic nevi of trunk: Secondary | ICD-10-CM | POA: Diagnosis not present

## 2017-05-14 DIAGNOSIS — B880 Other acariasis: Secondary | ICD-10-CM | POA: Diagnosis not present

## 2017-05-14 DIAGNOSIS — D485 Neoplasm of uncertain behavior of skin: Secondary | ICD-10-CM | POA: Diagnosis not present

## 2017-05-14 DIAGNOSIS — Z1283 Encounter for screening for malignant neoplasm of skin: Secondary | ICD-10-CM | POA: Diagnosis not present

## 2017-05-14 DIAGNOSIS — L821 Other seborrheic keratosis: Secondary | ICD-10-CM | POA: Diagnosis not present

## 2017-07-08 ENCOUNTER — Encounter: Payer: Self-pay | Admitting: Internal Medicine

## 2017-07-08 ENCOUNTER — Ambulatory Visit (INDEPENDENT_AMBULATORY_CARE_PROVIDER_SITE_OTHER): Payer: BLUE CROSS/BLUE SHIELD | Admitting: Internal Medicine

## 2017-07-08 ENCOUNTER — Other Ambulatory Visit (INDEPENDENT_AMBULATORY_CARE_PROVIDER_SITE_OTHER): Payer: BLUE CROSS/BLUE SHIELD

## 2017-07-08 VITALS — BP 122/80 | HR 90 | Temp 99.3°F | Ht 75.0 in | Wt 230.0 lb

## 2017-07-08 DIAGNOSIS — K219 Gastro-esophageal reflux disease without esophagitis: Secondary | ICD-10-CM

## 2017-07-08 DIAGNOSIS — Z23 Encounter for immunization: Secondary | ICD-10-CM | POA: Diagnosis not present

## 2017-07-08 MED ORDER — IPRATROPIUM BROMIDE 0.06 % NA SOLN: 2.0000 | Freq: Four times a day (QID) | NASAL | 12 refills | Status: DC

## 2017-07-08 MED ORDER — RABEPRAZOLE SODIUM 20 MG PO TBEC: 20.0000 mg | DELAYED_RELEASE_TABLET | Freq: Every day | ORAL | 3 refills | Status: DC

## 2017-07-08 NOTE — Progress Notes (Signed)
   Subjective:    Patient ID: Brandon Nielsen, male    DOB: August 17, 1956, 61 y.o.   MRN: [id number]  HPI The patient is a 61 YO man coming in for worsening GERD, he has had in the past before. Has been off medications for some time now. He is taking zantac BID over the counter which is not helping well. He has gained some weight since he stopped having symptoms. Denies significant change in diet. He denies fevers or chills. No diarrhea or constipation. No blood in stool. Long ago took aciphex which helped.   Review of Systems  Constitutional: Negative.   Respiratory: Negative for cough, chest tightness and shortness of breath.   Cardiovascular: Negative for chest pain, palpitations and leg swelling.  Gastrointestinal: Negative for abdominal distention, abdominal pain, constipation, diarrhea, nausea and vomiting.       GERD  Musculoskeletal: Negative.   Skin: Negative.   Neurological: Negative.       Objective:   Physical Exam  Constitutional: He is oriented to person, place, and time. He appears well-developed and well-nourished.  HENT:  Head: Normocephalic and atraumatic.  Eyes: EOM are normal.  Neck: Normal range of motion.  Cardiovascular: Normal rate and regular rhythm.   Pulmonary/Chest: Effort normal and breath sounds normal. No respiratory distress. He has no wheezes. He has no rales.  Abdominal: Soft. Bowel sounds are normal. He exhibits no distension. There is no tenderness. There is no rebound.  Musculoskeletal: He exhibits no edema.  Neurological: He is alert and oriented to person, place, and time. Coordination normal.  Skin: Skin is warm and dry.  Psychiatric: He has a normal mood and affect.   Vitals:   07/08/17 1621  BP: 122/80  Pulse: 90  Temp: 99.3 F (37.4 C)  TempSrc: Oral  SpO2: 98%  Weight: 230 lb (104.3 kg)  Height:  (1.905 m)      Assessment & Plan:  Flu shot given at visit.

## 2017-07-08 NOTE — Patient Instructions (Signed)
We have sent in the aciphex to take before breakfast daily. Give it two weeks or so and then call us back if you are not better.   We have also sent in the nose spray to use as needed for dripping nose.

## 2017-07-09 LAB — H. PYLORI ANTIBODY, IGG: H Pylori IgG: NEGATIVE

## 2017-07-10 NOTE — Assessment & Plan Note (Signed)
Rx for aciphex for short term. Advised that the flare could be related to recent weight gain. Checking for H pylori and treat as appropriate.

## 2017-09-15 ENCOUNTER — Other Ambulatory Visit: Payer: Self-pay | Admitting: Emergency Medicine

## 2017-09-15 MED ORDER — ZOSTER VAC RECOMB ADJUVANTED 50 MCG/0.5ML IM SUSR: 0.5000 mL | Freq: Once | INTRAMUSCULAR | 1 refills | Status: AC

## 2017-09-15 MED FILL — SHINGRIX VIAL KIT: 50 | 30 days supply | Qty: 1 | Fill #0

## 2017-10-16 DIAGNOSIS — H0011 Chalazion right upper eyelid: Secondary | ICD-10-CM | POA: Diagnosis not present

## 2017-10-17 DIAGNOSIS — L719 Rosacea, unspecified: Secondary | ICD-10-CM | POA: Diagnosis not present

## 2017-10-21 DIAGNOSIS — H0011 Chalazion right upper eyelid: Secondary | ICD-10-CM | POA: Diagnosis not present

## 2017-10-22 DIAGNOSIS — Z1283 Encounter for screening for malignant neoplasm of skin: Secondary | ICD-10-CM | POA: Diagnosis not present

## 2017-10-22 DIAGNOSIS — D225 Melanocytic nevi of trunk: Secondary | ICD-10-CM | POA: Diagnosis not present

## 2017-12-03 MED FILL — SHINGRIX VIAL KIT: 50 | 30 days supply | Qty: 1 | Fill #1

## 2018-05-05 DIAGNOSIS — M25661 Stiffness of right knee, not elsewhere classified: Secondary | ICD-10-CM | POA: Diagnosis not present

## 2018-08-05 ENCOUNTER — Other Ambulatory Visit: Payer: Self-pay | Admitting: Internal Medicine

## 2018-08-05 DIAGNOSIS — H1013 Acute atopic conjunctivitis, bilateral: Secondary | ICD-10-CM | POA: Diagnosis not present

## 2018-08-06 ENCOUNTER — Ambulatory Visit (INDEPENDENT_AMBULATORY_CARE_PROVIDER_SITE_OTHER): Payer: BLUE CROSS/BLUE SHIELD | Admitting: Internal Medicine

## 2018-08-06 VITALS — BP 128/80 | Temp 98.3°F

## 2018-08-06 DIAGNOSIS — Z23 Encounter for immunization: Secondary | ICD-10-CM

## 2018-08-06 NOTE — Patient Instructions (Signed)
Flu vaccine given.

## 2018-08-06 NOTE — Progress Notes (Signed)
Flu vaccine given by CMA 

## 2018-08-07 ENCOUNTER — Other Ambulatory Visit: Payer: Self-pay | Admitting: Internal Medicine

## 2018-08-07 MED ORDER — RABEPRAZOLE SODIUM 20 MG PO TBEC: 20.0000 mg | DELAYED_RELEASE_TABLET | Freq: Every day | ORAL | 0 refills | Status: DC

## 2018-08-07 NOTE — Telephone Encounter (Signed)
Requested medication (s) are due for refill today: yes  Requested medication (s) are on the active medication list: yes  EXPIRED  Last refill: 07/08/17   #90 3refills  Future visit scheduled: 08/19/18  Notes to clinic:  Expired    Requested Prescriptions  Pending Prescriptions Disp Refills   RABEprazole (ACIPHEX) 20 MG tablet 90 tablet 3    Sig: Take 1 tablet (20 mg total) by mouth daily.     Gastroenterology: Proton Pump Inhibitors Failed - 08/07/2018 12:09 PM      Failed - Valid encounter within last 12 months    Recent Outpatient Visits          1 year ago Gastroesophageal reflux disease, esophagitis presence not specified   [facility] -Willis Modena, MD   2 years ago Routine general medical examination at a health care facility   [facility] -Willis Modena, MD   2 years ago Acute URI   [facility] -Doneta Public, RN   3 years ago BPH (benign prostatic hyperplasia)   [facility] -Willis Modena, MD   5 years ago Routine general medical examination at a health care facility   [facility] -Lynnell Grain, Rosalyn Gess, MD      Future Appointments            In 1 week Okey Dupre Austin Miles, MD [facility], [facility]

## 2018-08-07 NOTE — Telephone Encounter (Signed)
Per office policy sent 30 day to local pharmacy until appt.../lmb  

## 2018-08-07 NOTE — Telephone Encounter (Signed)
Copied from CRM [id number]. Topic: Quick Communication - Rx Refill/Question >> Aug 07, 2018 11:00 AM Herby Abraham C wrote: Medication: RABEprazole (ACIPHEX) 20 MG tablet -- pt is scheduled to come in on 08/19/18 for a medication follow up. Pt says that he is going out of the [place] for a week and is scheduled to follow up with PCP when he return. Pt is completely out of his medication and would like to know if PCP could provide him with a Rx until his apt? Pt would like to be advised once Rx is at pharmacy if able.   Has the patient contacted their pharmacy? Yes  (Agent: If no, request that the patient contact the pharmacy for the refill.) (Agent: If yes, when and what did the pharmacy advise?)  Preferred Pharmacy (with phone number or street name): [facility] - [place], [place] - [address] [phone] (Phone) [phone] (Fax)    Agent: Please be advised that RX refills may take up to 3 business days. We ask that you follow-up with your pharmacy.

## 2018-08-19 ENCOUNTER — Ambulatory Visit: Payer: BLUE CROSS/BLUE SHIELD | Admitting: Internal Medicine

## 2018-08-19 ENCOUNTER — Encounter: Payer: Self-pay | Admitting: Internal Medicine

## 2018-08-19 ENCOUNTER — Other Ambulatory Visit (INDEPENDENT_AMBULATORY_CARE_PROVIDER_SITE_OTHER): Payer: BLUE CROSS/BLUE SHIELD

## 2018-08-19 VITALS — BP 130/82 | HR 87 | Temp 98.2°F | Ht 75.0 in | Wt 227.0 lb

## 2018-08-19 DIAGNOSIS — Z0000 Encounter for general adult medical examination without abnormal findings: Secondary | ICD-10-CM

## 2018-08-19 DIAGNOSIS — G4733 Obstructive sleep apnea (adult) (pediatric): Secondary | ICD-10-CM | POA: Diagnosis not present

## 2018-08-19 DIAGNOSIS — K219 Gastro-esophageal reflux disease without esophagitis: Secondary | ICD-10-CM

## 2018-08-19 LAB — CBC
HEMATOCRIT: 46.7 % (ref 39.0–52.0)
Hemoglobin: 16.1 g/dL (ref 13.0–17.0)
MCHC: 34.5 g/dL (ref 30.0–36.0)
MCV: 89.9 fl (ref 78.0–100.0)
PLATELETS: 236 10*3/uL (ref 150.0–400.0)
RBC: 5.2 Mil/uL (ref 4.22–5.81)
RDW: 13.8 % (ref 11.5–15.5)
WBC: 7.8 10*3/uL (ref 4.0–10.5)

## 2018-08-19 LAB — COMPREHENSIVE METABOLIC PANEL
ALBUMIN: 4.2 g/dL (ref 3.5–5.2)
ALT: 73 U/L — ABNORMAL HIGH (ref 0–53)
AST: 39 U/L — AB (ref 0–37)
Alkaline Phosphatase: 82 U/L (ref 39–117)
BUN: 18 mg/dL (ref 6–23)
CALCIUM: 10.1 mg/dL (ref 8.4–10.5)
CHLORIDE: 103 meq/L (ref 96–112)
CO2: 28 mEq/L (ref 19–32)
CREATININE: 0.96 mg/dL (ref 0.40–1.50)
GFR: 83.85 mL/min (ref >60.00)
Glucose, Bld: 103 mg/dL — ABNORMAL HIGH (ref 70–99)
POTASSIUM: 4.9 meq/L (ref 3.5–5.1)
Sodium: 139 mEq/L (ref 135–145)
Total Bilirubin: 0.9 mg/dL (ref 0.2–1.2)
Total Protein: 6.8 g/dL (ref 6.0–8.3)

## 2018-08-19 LAB — PSA: PSA: 0.65 ng/mL (ref 0.10–4.00)

## 2018-08-19 LAB — LIPID PANEL
CHOLESTEROL: 194 mg/dL (ref 0–200)
HDL: 60.7 mg/dL (ref >39.00)
LDL CALC: 114 mg/dL — AB (ref 0–99)
NonHDL: 133.62
TRIGLYCERIDES: 96 mg/dL (ref 0.0–149.0)
Total CHOL/HDL Ratio: 3
VLDL: 19.2 mg/dL (ref 0.0–40.0)

## 2018-08-19 MED ORDER — RABEPRAZOLE SODIUM 20 MG PO TBEC: 20.0000 mg | DELAYED_RELEASE_TABLET | Freq: Every day | ORAL | 3 refills | Status: DC

## 2018-08-19 MED ORDER — IPRATROPIUM BROMIDE 0.06 % NA SOLN: 2.0000 | Freq: Four times a day (QID) | NASAL | 12 refills | Status: DC

## 2018-08-19 NOTE — Patient Instructions (Addendum)
We are checking the labs today. We have sent in the refills.   Health Maintenance, Male A healthy lifestyle and preventive care is important for your health and wellness. Ask your health care provider about what schedule of regular examinations is right for you. What should I know about weight and diet? Eat a Healthy Diet  Eat plenty of vegetables, fruits, whole grains, low-fat dairy products, and lean protein.  Do not eat a lot of foods high in solid fats, added sugars, or salt.  Maintain a Healthy Weight Regular exercise can help you achieve or maintain a healthy weight. You should:  Do at least 150 minutes of exercise each week. The exercise should increase your heart rate and make you sweat (moderate-intensity exercise).  Do strength-training exercises at least twice a week.  Watch Your Levels of Cholesterol and Blood Lipids  Have your blood tested for lipids and cholesterol every 5 years starting at 64 years of age. If you are at high risk for heart disease, you should start having your blood tested when you are 64 years old. You may need to have your cholesterol levels checked more often if: ? Your lipid or cholesterol levels are high. ? You are older than 64 years of age. ? You are at high risk for heart disease.  What should I know about cancer screening? Many types of cancers can be detected early and may often be prevented. Lung Cancer  You should be screened every year for lung cancer if: ? You are a current smoker who has smoked for at least 30 years. ? You are a former smoker who has quit within the past 15 years.  Talk to your health care provider about your screening options, when you should start screening, and how often you should be screened.  Colorectal Cancer  Routine colorectal cancer screening usually begins at 64 years of age and should be repeated every 5-10 years until you are 64 years old. You may need to be screened more often if early forms of  precancerous polyps or small growths are found. Your health care provider may recommend screening at an earlier age if you have risk factors for colon cancer.  Your health care provider may recommend using home test kits to check for hidden blood in the stool.  A small camera at the end of a tube can be used to examine your colon (sigmoidoscopy or colonoscopy). This checks for the earliest forms of colorectal cancer.  Prostate and Testicular Cancer  Depending on your age and overall health, your health care provider may do certain tests to screen for prostate and testicular cancer.  Talk to your health care provider about any symptoms or concerns you have about testicular or prostate cancer.  Skin Cancer  Check your skin from head to toe regularly.  Tell your health care provider about any new moles or changes in moles, especially if: ? There is a change in a mole's size, shape, or color. ? You have a mole that is larger than a pencil eraser.  Always use sunscreen. Apply sunscreen liberally and repeat throughout the day.  Protect yourself by wearing long sleeves, pants, a wide-brimmed hat, and sunglasses when outside.  What should I know about heart disease, diabetes, and high blood pressure?  If you are 46-43 years of age, have your blood pressure checked every 3-5 years. If you are 48 years of age or older, have your blood pressure checked every year. You should have your blood  pressure measured twice-once when you are at a hospital or clinic, and once when you are not at a hospital or clinic. Record the average of the two measurements. To check your blood pressure when you are not at a hospital or clinic, you can use: ? An automated blood pressure machine at a pharmacy. ? A home blood pressure monitor.  Talk to your health care provider about your target blood pressure.  If you are between 18-40 years old, ask your health care provider if you should take aspirin to prevent heart  disease.  Have regular diabetes screenings by checking your fasting blood sugar level. ? If you are at a normal weight and have a low risk for diabetes, have this test once every three years after the age of 40. ? If you are overweight and have a high risk for diabetes, consider being tested at a younger age or more often.  A one-time screening for abdominal aortic aneurysm (AAA) by ultrasound is recommended for men aged 19-75 years who are current or former smokers. What should I know about preventing infection? Hepatitis B If you have a higher risk for hepatitis B, you should be screened for this virus. Talk with your health care provider to find out if you are at risk for hepatitis B infection. Hepatitis C Blood testing is recommended for:  Everyone born from [birth year] through 1965.  Anyone with known risk factors for hepatitis C.  Sexually Transmitted Diseases (STDs)  You should be screened each year for STDs including gonorrhea and chlamydia if: ? You are sexually active and are younger than 64 years of age. ? You are older than 64 years of age and your health care provider tells you that you are at risk for this type of infection. ? Your sexual activity has changed since you were last screened and you are at an increased risk for chlamydia or gonorrhea. Ask your health care provider if you are at risk.  Talk with your health care provider about whether you are at high risk of being infected with HIV. Your health care provider may recommend a prescription medicine to help prevent HIV infection.  What else can I do?  Schedule regular health, dental, and eye exams.  Stay current with your vaccines (immunizations).  Do not use any tobacco products, such as cigarettes, chewing tobacco, and e-cigarettes. If you need help quitting, ask your health care provider.  Limit alcohol intake to no more than 2 drinks per day. One drink equals 12 ounces of beer, 5 ounces of wine, or 1 ounces of  hard liquor.  Do not use street drugs.  Do not share needles.  Ask your health care provider for help if you need support or information about quitting drugs.  Tell your health care provider if you often feel depressed.  Tell your health care provider if you have ever been abused or do not feel safe at home. This information is not intended to replace advice given to you by your health care provider. Make sure you discuss any questions you have with your health care provider. Document Released: 03/28/2008 Document Revised: 05/29/2016 Document Reviewed: 07/04/2015 Elsevier Interactive Patient Education  [employer].

## 2018-08-19 NOTE — Progress Notes (Signed)
   Subjective:    Patient ID: Brandon Nielsen, male    DOB: 1956/05/02, 62 y.o.   MRN: [id number]  HPI The patient is a 62 YO man coming in for physical.   PMH, FMH, social history reviewed and updated.   Review of Systems  Constitutional: Negative.   HENT: Negative.   Eyes: Negative.   Respiratory: Negative for cough, chest tightness and shortness of breath.   Cardiovascular: Negative for chest pain, palpitations and leg swelling.  Gastrointestinal: Negative for abdominal distention, abdominal pain, constipation, diarrhea, nausea and vomiting.  Musculoskeletal: Negative.   Skin: Negative.   Neurological: Negative.   Psychiatric/Behavioral: Negative.       Objective:   Physical Exam  Constitutional: He is oriented to person, place, and time. He appears well-developed and well-nourished.  HENT:  Head: Normocephalic and atraumatic.  Eyes: EOM are normal.  Neck: Normal range of motion.  Cardiovascular: Normal rate and regular rhythm.  Pulmonary/Chest: Effort normal and breath sounds normal. No respiratory distress. He has no wheezes. He has no rales.  Abdominal: Soft. Bowel sounds are normal. He exhibits no distension. There is no tenderness. There is no rebound.  Musculoskeletal: He exhibits no edema.  Neurological: He is alert and oriented to person, place, and time. Coordination normal.  Skin: Skin is warm and dry.  Psychiatric: He has a normal mood and affect.   Vitals:   08/19/18 0908  BP: 130/82  Pulse: 87  Temp: 98.2 F (36.8 C)  TempSrc: Oral  SpO2: 97%  Weight: 227 lb (103 kg)  Height: 6\' 3"  (1.905 m)      Assessment & Plan:

## 2018-08-21 ENCOUNTER — Other Ambulatory Visit: Payer: Self-pay | Admitting: Internal Medicine

## 2018-08-21 ENCOUNTER — Telehealth: Payer: Self-pay | Admitting: Internal Medicine

## 2018-08-21 DIAGNOSIS — R945 Abnormal results of liver function studies: Principal | ICD-10-CM

## 2018-08-21 DIAGNOSIS — R7989 Other specified abnormal findings of blood chemistry: Secondary | ICD-10-CM

## 2018-08-21 NOTE — Assessment & Plan Note (Signed)
Still using dental appliance.

## 2018-08-21 NOTE — Assessment & Plan Note (Signed)
Refilled aciphex, has been out several weeks and getting symptoms back again. Good control on the medicine.

## 2018-08-21 NOTE — Assessment & Plan Note (Signed)
Flu shot up to date. Shingrix complete. Tetanus up to date. Colonoscopy up to date. Counseled about sun safety and mole surveillance. Counseled about the dangers of distracted driving. Given 10 year screening recommendations.  

## 2018-08-21 NOTE — Telephone Encounter (Signed)
No, this medication does not change liver numbers.

## 2018-08-21 NOTE — Telephone Encounter (Signed)
LVM with MD response  

## 2018-08-21 NOTE — Telephone Encounter (Signed)
Patient called and asked if Diclofenac can cause his liver enzymes to go up. He says he has been taking this medication. I advised I will send this to Dr. Okey Dupre and someone from the office will call with the answer to his question.

## 2018-09-22 ENCOUNTER — Other Ambulatory Visit (INDEPENDENT_AMBULATORY_CARE_PROVIDER_SITE_OTHER): Payer: BLUE CROSS/BLUE SHIELD

## 2018-09-22 DIAGNOSIS — R945 Abnormal results of liver function studies: Secondary | ICD-10-CM | POA: Diagnosis not present

## 2018-09-22 DIAGNOSIS — R7989 Other specified abnormal findings of blood chemistry: Secondary | ICD-10-CM

## 2018-09-22 LAB — HEPATIC FUNCTION PANEL
ALT: 26 U/L (ref 0–53)
AST: 19 U/L (ref 0–37)
Albumin: 4.2 g/dL (ref 3.5–5.2)
Alkaline Phosphatase: 82 U/L (ref 39–117)
BILIRUBIN DIRECT: 0.2 mg/dL (ref 0.0–0.3)
Total Bilirubin: 0.9 mg/dL (ref 0.2–1.2)
Total Protein: 7 g/dL (ref 6.0–8.3)

## 2018-10-21 DIAGNOSIS — Z1283 Encounter for screening for malignant neoplasm of skin: Secondary | ICD-10-CM | POA: Diagnosis not present

## 2018-10-21 DIAGNOSIS — L821 Other seborrheic keratosis: Secondary | ICD-10-CM | POA: Diagnosis not present

## 2019-08-19 ENCOUNTER — Ambulatory Visit (INDEPENDENT_AMBULATORY_CARE_PROVIDER_SITE_OTHER): Payer: BC Managed Care – PPO

## 2019-08-19 ENCOUNTER — Other Ambulatory Visit: Payer: Self-pay

## 2019-08-19 DIAGNOSIS — Z23 Encounter for immunization: Secondary | ICD-10-CM

## 2019-08-31 ENCOUNTER — Other Ambulatory Visit: Payer: Self-pay | Admitting: Internal Medicine

## 2019-09-02 ENCOUNTER — Other Ambulatory Visit: Payer: Self-pay | Admitting: Internal Medicine

## 2019-10-14 ENCOUNTER — Telehealth: Payer: Self-pay | Admitting: Internal Medicine

## 2019-10-19 MED ORDER — RABEPRAZOLE SODIUM 20 MG PO TBEC: 20.0000 mg | DELAYED_RELEASE_TABLET | Freq: Every day | ORAL | 0 refills | Status: DC

## 2019-10-19 MED ORDER — IPRATROPIUM BROMIDE 0.06 % NA SOLN: NASAL | 0 refills | Status: DC

## 2019-10-19 NOTE — Addendum Note (Signed)
Addended by: Roney Mans on: 10/19/2019 11:21 AM   Modules accepted: Orders

## 2019-10-19 NOTE — Telephone Encounter (Signed)
Done

## 2019-10-19 NOTE — Telephone Encounter (Signed)
Pt called in to schedule medication follow up visit. Pt says that he is completely out of his medication. Pt would like to know if provider could fill until his apt?   Please advise.

## 2019-10-21 ENCOUNTER — Encounter: Payer: Self-pay | Admitting: Internal Medicine

## 2019-10-21 ENCOUNTER — Ambulatory Visit (INDEPENDENT_AMBULATORY_CARE_PROVIDER_SITE_OTHER): Payer: Medicare Other | Admitting: Internal Medicine

## 2019-10-21 ENCOUNTER — Other Ambulatory Visit: Payer: Self-pay

## 2019-10-21 DIAGNOSIS — K219 Gastro-esophageal reflux disease without esophagitis: Secondary | ICD-10-CM | POA: Diagnosis not present

## 2019-10-21 MED ORDER — RABEPRAZOLE SODIUM 20 MG PO TBEC: 20.0000 mg | DELAYED_RELEASE_TABLET | Freq: Every day | ORAL | 3 refills | Status: DC

## 2019-10-21 MED ORDER — IPRATROPIUM BROMIDE 0.06 % NA SOLN: NASAL | 11 refills | Status: DC

## 2019-10-21 NOTE — Assessment & Plan Note (Signed)
Doing well on aciphex and will continue.

## 2019-10-21 NOTE — Progress Notes (Signed)
Virtual Visit via Audio Note  I connected with Brandon Nielsen on 10/21/19 at  3:00 PM EST by an audio-only enabled telemedicine application and verified that I am speaking with the correct person using two identifiers.  The patient and the provider were at separate locations throughout the entire encounter.   I discussed the limitations of evaluation and management by telemedicine and the availability of in person appointments. The patient expressed understanding and agreed to proceed. The patient and the provider were the only parties present for the visit unless noted in HPI below.  History of Present Illness: The patient is a 64 y.o. man with visit for follow up general health. Doing well overall, being very cautious about covid-19. Denies illness. Denies chest pains or SOB. Denies blood in stool, diarrhea, constipation. Overall it is stable.   Observations/Objective: Voice strong, no coughing or dyspnea during visit, A and O times 3  Assessment and Plan: See problem oriented charting  Follow Up Instructions: refill aciphex and ipratropium nose spray  Visit time 11 minutes in non-face to face communication with patient and coordination of care.  I discussed the assessment and treatment plan with the patient. The patient was provided an opportunity to ask questions and all were answered. The patient agreed with the plan and demonstrated an understanding of the instructions.   The patient was advised to call back or seek an in-person evaluation if the symptoms worsen or if the condition fails to improve as anticipated.  Myrlene Broker, MD

## 2019-10-26 ENCOUNTER — Other Ambulatory Visit: Payer: Self-pay

## 2019-10-26 ENCOUNTER — Encounter: Payer: Self-pay | Admitting: Internal Medicine

## 2019-10-26 ENCOUNTER — Ambulatory Visit (INDEPENDENT_AMBULATORY_CARE_PROVIDER_SITE_OTHER): Payer: Medicare Other | Admitting: Internal Medicine

## 2019-10-26 DIAGNOSIS — Z20822 Contact with and (suspected) exposure to covid-19: Secondary | ICD-10-CM

## 2019-10-26 NOTE — Progress Notes (Signed)
Mr. Luna was exposed to Covid-19 through a housekeeper at his home. He has no symptoms currently. Testing accomplished outdoors in his car.

## 2019-10-26 NOTE — Addendum Note (Signed)
Addended by: Gregery Na on: 10/26/2019 10:13 AM   Modules accepted: Orders

## 2019-10-29 ENCOUNTER — Telehealth: Payer: Self-pay | Admitting: Internal Medicine

## 2019-10-29 LAB — SARS-COV-2 RNA,(COVID-19) QUALITATIVE NAAT: SARS CoV2 RNA: NOT DETECTED

## 2019-10-29 NOTE — Telephone Encounter (Signed)
Spoke with patient. Covid-19 test is negative.

## 2019-11-12 ENCOUNTER — Ambulatory Visit: Payer: Self-pay

## 2019-11-17 ENCOUNTER — Ambulatory Visit: Payer: Self-pay

## 2019-11-17 ENCOUNTER — Ambulatory Visit (INDEPENDENT_AMBULATORY_CARE_PROVIDER_SITE_OTHER): Payer: Medicare Other | Admitting: Internal Medicine

## 2019-11-17 ENCOUNTER — Encounter: Payer: Self-pay | Admitting: Internal Medicine

## 2019-11-17 ENCOUNTER — Other Ambulatory Visit: Payer: Self-pay

## 2019-11-17 VITALS — BP 124/78 | HR 100 | Temp 98.7°F | Ht 75.0 in | Wt 213.0 lb

## 2019-11-17 DIAGNOSIS — E559 Vitamin D deficiency, unspecified: Secondary | ICD-10-CM | POA: Diagnosis not present

## 2019-11-17 DIAGNOSIS — R413 Other amnesia: Secondary | ICD-10-CM | POA: Diagnosis not present

## 2019-11-17 DIAGNOSIS — Z13220 Encounter for screening for lipoid disorders: Secondary | ICD-10-CM | POA: Diagnosis not present

## 2019-11-17 LAB — TSH: TSH: 0.83 u[IU]/mL (ref 0.35–4.50)

## 2019-11-17 LAB — LIPID PANEL
Cholesterol: 177 mg/dL (ref 0–200)
HDL: 73.3 mg/dL (ref >39.00)
LDL Cholesterol: 92 mg/dL (ref 0–99)
NonHDL: 103.56
Total CHOL/HDL Ratio: 2
Triglycerides: 58 mg/dL (ref 0.0–149.0)
VLDL: 11.6 mg/dL (ref 0.0–40.0)

## 2019-11-17 LAB — COMPREHENSIVE METABOLIC PANEL
ALT: 21 U/L (ref 0–53)
AST: 23 U/L (ref 0–37)
Albumin: 4.2 g/dL (ref 3.5–5.2)
Alkaline Phosphatase: 83 U/L (ref 39–117)
BUN: 15 mg/dL (ref 6–23)
CO2: 29 mEq/L (ref 19–32)
Calcium: 9.8 mg/dL (ref 8.4–10.5)
Chloride: 102 mEq/L (ref 96–112)
Creatinine, Ser: 1.1 mg/dL (ref 0.40–1.50)
GFR: 67.16 mL/min (ref >60.00)
Glucose, Bld: 108 mg/dL — ABNORMAL HIGH (ref 70–99)
Potassium: 4.7 mEq/L (ref 3.5–5.1)
Sodium: 139 mEq/L (ref 135–145)
Total Bilirubin: 0.9 mg/dL (ref 0.2–1.2)
Total Protein: 7.2 g/dL (ref 6.0–8.3)

## 2019-11-17 LAB — VITAMIN B12: Vitamin B-12: 228 pg/mL (ref 211–911)

## 2019-11-17 LAB — VITAMIN D 25 HYDROXY (VIT D DEFICIENCY, FRACTURES): VITD: 16.1 ng/mL — ABNORMAL LOW (ref 30.00–100.00)

## 2019-11-17 LAB — CBC
HCT: 47.6 % (ref 39.0–52.0)
Hemoglobin: 16.2 g/dL (ref 13.0–17.0)
MCHC: 34.1 g/dL (ref 30.0–36.0)
MCV: 92 fl (ref 78.0–100.0)
Platelets: 252 10*3/uL (ref 150.0–400.0)
RBC: 5.17 Mil/uL (ref 4.22–5.81)
RDW: 13.9 % (ref 11.5–15.5)
WBC: 9.5 10*3/uL (ref 4.0–10.5)

## 2019-11-17 LAB — T4, FREE: Free T4: 1.05 ng/dL (ref 0.60–1.60)

## 2019-11-17 NOTE — Patient Instructions (Signed)
We will do the labs and the MRI.

## 2019-11-17 NOTE — Progress Notes (Signed)
   Subjective:   Patient ID: Brandon Nielsen, male    DOB: 1956/05/20, 64 y.o.   MRN: [id number]  HPI The patient is a 64 YO man coming in with his wife to discuss some concerns about memory. They have noticed some changes in the last several years. Retired from Social worker firm 2016 and around that time he did feel as though he was losing his edge a bit. 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 They have noticed some distraction more. He does feel like he is having a hard time focusing and at times getting overwhelmed while working on paperwork. His wife is handling bills and finances at this time. He denies any falls or injuries to the head in past or recent years. Has also been losing some weight with some decrease in appetite in the last year or so. Down about 20 pounds. Some losing of household items. Has not been leaving appliances on. No problems with getting lost driving but wife is concerned he may be running a few more stop signs or lights in recent years than previous. No accident or traffic tickets.   PMH, [facility], social history reviewed and updated.   Review of Systems  Constitutional: Negative.   HENT: Negative.   Eyes: Negative.   Respiratory: Negative for cough, chest tightness and shortness of breath.   Cardiovascular: Negative for chest pain, palpitations and leg swelling.  Gastrointestinal: Negative for abdominal distention, abdominal pain, constipation, diarrhea, nausea and vomiting.  Musculoskeletal: Negative.   Skin: Negative.   Neurological: Negative.        Memory changes  Psychiatric/Behavioral: Negative.     Objective:  Physical Exam Constitutional:      Appearance: He is well-developed.  HENT:     Head: Normocephalic and atraumatic.  Cardiovascular:     Rate and Rhythm: Normal rate and regular rhythm.  Pulmonary:     Effort: Pulmonary effort is normal. No respiratory distress.     Breath  sounds: Normal breath sounds. No wheezing or rales.  Abdominal:     General: Bowel sounds are normal. There is no distension.     Palpations: Abdomen is soft.     Tenderness: There is no abdominal tenderness. There is no rebound.  Musculoskeletal:     Cervical back: Normal range of motion.  Skin:    General: Skin is warm and dry.  Neurological:     Mental Status: He is alert and oriented to person, place, and time.     Coordination: Coordination normal.     Vitals:   11/17/19 1019  BP: 124/78  Pulse: 100  Temp: 98.7 F (37.1 C)  TempSrc: Oral  SpO2: 98%  Weight: 213 lb (96.6 kg)  Height: 6\' 3"  (1.905 m)    This visit occurred during the SARS-CoV-2 public health emergency.  Safety protocols were in place, including screening questions prior to the visit, additional usage of staff PPE, and extensive cleaning of exam room while observing appropriate contact time as indicated for disinfecting solutions.   Assessment & Plan:  Visit time 20 minutes in face to face communication with patient and coordination of care, additional 10 minutes spent in record review, coordination or care, ordering tests, communicating/referring to other healthcare professionals, documenting in medical records all on the same day of the visit for total time 30 minutes spent on the visit.

## 2019-11-18 DIAGNOSIS — R413 Other amnesia: Secondary | ICD-10-CM | POA: Insufficient documentation

## 2019-11-18 NOTE — Assessment & Plan Note (Signed)
Checking MRI, labs. Refer to neuropsych for evaluation and assessment. If significant delay can see neurology first. There is some component of depression/anxiety with change in job which could be causing some component of symptoms.

## 2019-11-20 ENCOUNTER — Ambulatory Visit: Payer: Self-pay

## 2019-11-22 ENCOUNTER — Ambulatory Visit: Payer: Medicare Other | Attending: Internal Medicine

## 2019-11-22 DIAGNOSIS — Z23 Encounter for immunization: Secondary | ICD-10-CM | POA: Insufficient documentation

## 2019-11-22 NOTE — Progress Notes (Signed)
   Covid-19 Vaccination Clinic  Name:  Brandon Nielsen    MRN: [id number] DOB: [birth year]  11/22/2019  Brandon Nielsen was observed post Covid-19 immunization for 15 minutes without incidence. He was provided with Vaccine Information Sheet and instruction to access the V-Safe system.   Brandon Nielsen was instructed to call 911 with any severe reactions post vaccine: Marland Kitchen Difficulty breathing  . Swelling of your face and throat  . A fast heartbeat  . A bad rash all over your body  . Dizziness and weakness    Immunizations Administered    Name Date Dose VIS Date Route   Pfizer COVID-19 Vaccine 11/22/2019  9:24 AM 0.3 mL 09/24/2019 Intramuscular   Manufacturer: [employer], [employer]   Lot: [id number]   NDC: [address]-3

## 2019-11-24 ENCOUNTER — Other Ambulatory Visit: Payer: Self-pay | Admitting: Internal Medicine

## 2019-11-24 MED ORDER — VITAMIN D (ERGOCALCIFEROL) 1.25 MG (50000 UNIT) PO CAPS: 50000.0000 [IU] | ORAL_CAPSULE | ORAL | 0 refills | Status: DC

## 2019-12-03 ENCOUNTER — Encounter: Payer: Self-pay | Admitting: Internal Medicine

## 2019-12-07 ENCOUNTER — Encounter: Payer: Self-pay | Admitting: Internal Medicine

## 2019-12-08 ENCOUNTER — Encounter: Payer: Self-pay | Admitting: Counselor

## 2019-12-16 ENCOUNTER — Ambulatory Visit
Admission: RE | Admit: 2019-12-16 | Discharge: 2019-12-16 | Disposition: A | Discharge status: Home / Self Care | Payer: Medicare Other | Source: Ambulatory Visit | Attending: Internal Medicine | Admitting: Internal Medicine

## 2019-12-16 ENCOUNTER — Ambulatory Visit: Payer: Medicare Other | Attending: Internal Medicine

## 2019-12-16 ENCOUNTER — Other Ambulatory Visit: Payer: Self-pay

## 2019-12-16 DIAGNOSIS — R413 Other amnesia: Secondary | ICD-10-CM

## 2019-12-16 DIAGNOSIS — Z23 Encounter for immunization: Secondary | ICD-10-CM | POA: Insufficient documentation

## 2019-12-16 NOTE — Progress Notes (Signed)
   Covid-19 Vaccination Clinic  Name:  Brandon Nielsen    MRN: [id number] DOB: 1956-09-07  12/16/2019  Brandon Nielsen was observed post Covid-19 immunization for 15 minutes without incident. He was provided with Vaccine Information Sheet and instruction to access the V-Safe system.   Brandon Nielsen was instructed to call 911 with any severe reactions post vaccine: Marland Kitchen Difficulty breathing  . Swelling of face and throat  . A fast heartbeat  . A bad rash all over body  . Dizziness and weakness   Immunizations Administered    Name Date Dose VIS Date Route   Pfizer COVID-19 Vaccine 12/16/2019  4:22 PM 0.3 mL 09/24/2019 Intramuscular   Manufacturer: [employer], [employer]   Lot: [id number]   NDC: [address]-7

## 2019-12-17 ENCOUNTER — Encounter: Payer: Self-pay | Admitting: Internal Medicine

## 2019-12-27 ENCOUNTER — Ambulatory Visit: Payer: Medicare Other

## 2019-12-27 ENCOUNTER — Encounter: Payer: Self-pay | Admitting: Counselor

## 2019-12-27 ENCOUNTER — Other Ambulatory Visit: Payer: Self-pay

## 2019-12-27 ENCOUNTER — Ambulatory Visit (INDEPENDENT_AMBULATORY_CARE_PROVIDER_SITE_OTHER): Payer: Medicare Other | Admitting: Counselor

## 2019-12-27 DIAGNOSIS — F09 Unspecified mental disorder due to known physiological condition: Secondary | ICD-10-CM

## 2019-12-27 NOTE — Progress Notes (Signed)
[facility]  Patient Name: Brandon Nielsen MRN: [id number] Date of Birth: 1956/03/05 Age: 64 y.o. Education: 19 years  Referral Circumstances and Background Information  Brandon Nielsen is a 64 y.o., left-hand dominant, married man with a history of mild OSA and thinking and memory problems over the past several years who was referred by his PCP for evaluation. 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   On interview, the patient stated that he is aware of his problems. The first change he noticed was difficulties assembling documents and difficulties handling money. His wife started noticing changes about 2 years ago, he was having a hard time "processing information," for instance he started taking a long time to assemble plates when he was making dinner and he would sometimes get the wrong things (he would get a plate of chicken only when there were many dishes and he didn't seem to notice). He noticed he was having a hard time counting out money at the ATM and started to make errors. About two years ago, he was assembling documents for taxes, his wife saw how difficult it was for him and took over the finances at that time. He has also has had changes in handwriting. His handwriting has "always been terrible," although it got to the point that his wife couldn't read the checkbook and he cannot read his notes. Interestingly, they stated that his memory is fairly good, they haven't noticed him repeating himself,  rapidly forgetting things, or forgetting to relay calls. He has a hard time with sequencing multi step tasks and with organizational things. His wife thinks that he over compensates, so he will photo copy things 3-4 times and then that just makes it more confusing. They haven't noticed issues with visuospatial functioning, such as problems shaving, getting disoriented driving, or problems reaching for things. They haven't noticed problems with language. They denied any personality changes, hallucinations, or delusions.   With respect to mood, the patient stated that he is "not bad" although he is personally very concerned about where he is right now and the cognitive changes. His wife stated that he is an "everything is fine" kind of person. He has diminished appetite, and has lost about 28 lbs the past several months. He stated that his sleep is good, and he usually gets about 8 or 9 hours a day. His energy is normal for him.   With respect to functioning, the patient is still working full time and reported that he is doing adequately, and said that no one has noticed his problems. He does feel like he is somewhat less efficient and it is more challenging. His wife, however, stated that some of the changes have been noticed (his sister has asked if he is ok, because he seems very confused at work and she has to spend a lot of time correcting things and helping him find things). He does have a harder time using the computer. The patient's wife stated that he is still driving and is getting to  the point where there is some concern on her part, although the patient thinks he is doing ok. He tends to drive in a local area. He occasionally runs red lights and goes through stop signs. He hasn't noticed any major changes in his hobbies, they play golf and he has been getting better. He used to cook a fair amount and no longer does that, his wife thinks that is due to a combination of difficulties with the cooking  process and lack of interest. He doesn't have any frank difficulties with orientation although he does have a hard time keeping track of their schedule.   Past Medical History and Review of Relevant Studies   Patient Active Problem List   Diagnosis Date Noted  . Memory loss 11/18/2019  . Routine health maintenance 11/08/2012  . Obstructive sleep apnea 01/09/2008  . GASTROESOPHAGEAL REFLUX DISEASE 01/09/2008    Review of Neuroimaging and Relevant Studies:  Brandon Nielsen has a recent MRI from 12/16/2019, which I was able to personally review. 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 His posterior atrophy appears somewhat more pronounced on the right than the left, whereas his frontal atrophy assumes the opposite laterality (L>R). 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 Likely enough to contribute to but not to cause a dementia level of function in my opinion.   Current Outpatient Medications  Medication Sig Dispense Refill  . ipratropium (ATROVENT) 0.06 % nasal spray PLACE 2 SPRAYS INTO BOTH NOSTRILS 4 TIMES A DAY. 15 mL 11  . prednisoLONE acetate (PRED FORTE) 1 % ophthalmic suspension     . prednisoLONE acetate (PRED MILD) 0.12 % ophthalmic suspension Place 1 drop into both eyes daily.    . RABEprazole (ACIPHEX) 20 MG tablet Take 1 tablet (20 mg total) by mouth daily. 90 tablet 3  . ranitidine (ZANTAC) 150 MG capsule Take 150 mg by mouth as needed for heartburn.    . Vitamin D, Ergocalciferol, (DRISDOL) 1.25 MG (50000 UNIT) CAPS capsule Take 1 capsule (50,000 Units total) by mouth every 7 (seven) days. 12 capsule 0   No current facility-administered medications for this visit.    Family History  Problem Relation Age of Onset  . Cancer Mother        cervical cancer/non-hodgikins lymphoma  . Hypertension Mother   . Colon polyps Mother   . Heart disease Father    . Hypertension Father     There is a family history of dementia. The patient's father developed the condition in his 76s and died around 90 years of age. His mother also developed the condition although she developed the condition much later in life. There is no  family history of psychiatric illness.  Psychosocial History  Developmental, Educational and Employment History: The patient grew up in [place]. He stated that he did very well in school, was never held back, and earned mainly A's. He did his undergrad in Radio broadcast assistant at [employer] and then went to [employer] where he earned his [employer]. 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   Psychiatric History: The patient denied any significant history of mental health issues, although he did take antidepressants for a time after a divorce, which he  took for only a couple of months.   Substance Use History: It sounds like the patient did start drinking a bit heavily after he retired, and it sounds like he still drinks quite a bit, sometimes 2 or 3 glasses a night other times a bottle of wine.   Relationship History and Living Cimcumstances: The patient and his wife have been married for 25 years, they have three children between them from previous marriages. It sounds like his children have noticed his changes and have asked if he is ok (two live in [place] one lives in town).   Mental Status and Behavioral Observations  Sensorium/Arousal: The patient's level of arousal was awake and alert.  Orientation: The patient was oriented to person, place, time, and date. He was well grounded in situation.  Appearance: Dressed in appropriate, casual clothing with good grooming and hygiene.  Behavior: Brandon Nielsen was pleasant and appropriate and participated actively in  the clinical encounter.  Speech/language: Normal  Gait/Posture: The patient's gait appeared normal on observation of ambulation in the clinic Movement: No tremors or other concerning movement symptoms evident on observation.  Social Comportment: The patient was appropriate and pleasant, presented as somewhat anxious about the testing and it was clearly difficult for him.  Mood: "Good, I am worried about what's going on."  Affect: The patient's affect was mainly neutral albeit with some anxiety in response to test procedures.  Thought process/content: Thought process was logical, linear, and goal-oriented although he often seemed to be at a loss as to certain history and deferred to his wife at times. Thought content was appropriate to the topics discussed.  Safety: No safety concerns (e.g., thoughts of harming self or others) were identified at today's encounter.  Insight: [facility] Cognitive Assessment  12/27/2019  Visuospatial/ Executive (0/5) 0  Naming (0/3) 3  Attention: Read list of digits (0/2) 2  Attention: Read list of letters (0/1) 1  Attention: Serial 7 subtraction starting at 100 (0/3) 1  Language: Repeat phrase (0/2) 2  Language : Fluency (0/1) 0  Abstraction (0/2) 1  Delayed Recall (0/5) 2  Orientation (0/6) 6  Total 18  Adjusted Score (based on education) 18   Test Procedures  Wide Range Achievement Test - 4             Word Reading Neuropsychological Assessment Battery  List Learning  Story Learning  Daily Living Memory  Naming  Digit Span Repeatable Battery for the Assessment of Neuropsychological Status (Form A)  Figure Copy  Judgment of Line Orientation  Coding  Figure Recall The Dot Counting Test A Random Letter Test Controlled Oral Word Association (F-A-S) Semantic Fluency (Animals) Trail Making Test A & B Complex Ideational Material Geriatric Depression Scale - Short Form Quick Dementia Rating System (completed by wife, Izora Gala)  Plan  Brandon Nielsen was seen for a psychiatric diagnostic evaluation and neuropsychological testing. Testing was completed. Full and complete note with impressions, recommendations, and interpretation of test data to follow.   Viviano Simas Nicole Kindred, PsyD, [place] Clinical Neuropsychologist  Informed Consent and Coding/Compliance  Risks and benefits of the evaluation were discussed with the patient as were the limits of confidentiality. I conducted a clinical interview and neuropsychological testing (more than two tests) with Brandon Nielsen and Lamar Benes, B.S. (Technician) assisted me in administering additional test procedures. The patient was able to tolerate the testing procedures and the patient (and/or family if applicable) is likely to benefit from further follow  up to receive the diagnosis and treatment recommendations, which will be rendered at the next encounter. Billing below reflects technician time, my direct face-to-face time with the patient, time spent in test administration, and time spent in professional activities including but not limited to: neuropsychological test interpretation, integration of neuropsychological test data with clinical history, report preparation, treatment planning, care coordination, and review of diagnostically pertinent medical history or studies.   Services associated with this encounter: Clinical Interview [phone]) plus 60 minutes (15183; Neuropsychological Evaluation by Professional)  155 minutes (43735; Neuropsychological Evaluation by Professional, Adl.) 30 minutes (78978; Test Administration by Professional) 30 minutes (47841; Neuropsychological Testing by Technician) 100 minutes (28208; Neuropsychological Testing by Technician, Adl.)

## 2019-12-27 NOTE — Progress Notes (Signed)
   Psychometrist Note   Cognitive testing was administered to Brandon Nielsen by Brandon Nielsen, B.S. (Technician) (psychometrist) under the supervision of Brandon Nielsen, Psy.D., ABN. Brandon Nielsen was able to tolerate all test procedures. Brandon Nielsen met with the patient as needed to manage any emotional reactions to the testing procedures (if applicable). Rest breaks were offered.    The battery of tests administered was selected by Brandon Nielsen with consideration to the patient's current level of functioning, the nature of his symptoms, emotional and behavioral responses during the interview, level of literacy, observed level of motivation/effort, and the nature of the referral question. This battery was communicated to the psychometrist. Communication between Brandon Nielsen and the psychometrist was ongoing throughout the evaluation and Brandon Nielsen was immediately accessible at all times. Brandon Nielsen provided supervision to the technician on the date of this service, to the extent necessary to assure the quality of all services provided.    Brandon Nielsen will return in approximately one week for an interactive feedback session with Brandon Nielsen, at which time male test performance, clinical impressions, and treatment recommendations will be reviewed in detail. The patient understands he can contact our office should he require our assistance before this time.   A total of 130 minutes of billable time were spent with Brandon Nielsen by the technician, including test administration and scoring time. Billing for these services is reflected in Brandon Nielsen note.   This note reflects time spent with the psychometrician and does not include test scores, clinical history, or any interpretations made by Brandon Nielsen. The full report will follow in a separate note.

## 2019-12-31 ENCOUNTER — Encounter: Payer: Self-pay | Admitting: Counselor

## 2019-12-31 NOTE — Progress Notes (Signed)
NEUROPSYCHOLOGICAL TEST SCORES [facility]  Patient Name: Brandon Nielsen MRN: [id number] Date of Birth: Dec 24, 1955 Age: 64 y.o. Education: 19 years  Measurement properties of test scores: IQ, Index, and Standard Scores (SS): Mean = 100; Standard Deviation = 15 Scaled Scores (Ss): Mean = 10; Standard Deviation = 3 Z scores (Z): Mean = 0; Standard Deviation = 1 T scores (T); Mean = 50; Standard Deviation = 10  TEST SCORES:    Note: This summary of test scores accompanies the interpretive report and should not be considered in isolation without reference to the appropriate sections in the text. Test scores are relative to age, gender, and educational history as available and appropriate.   Performance Validity     Raw Score Descriptor  A Random Letter Test 0 Within Expectation  The Dot Counting Test: 52 Below Expectation      Mental Status Screening     Total Score Descriptor  MoCA 18 Mild Dementia      Expected Functioning        Wide Range Achievement Test: Standard Score Percentile      Word Reading 104 61      Attention/Processing Speed        Neuropsychological Assessment Battery (Attention Module, Form 1): T-score Percentile      Digits Forward 58 79      Digits Backwards 25 1      Repeatable Battery for the Assessment of Neuropsychological Status (Form A): Scaled Score Percentile      Coding 1 <1      Language        Neuropsychological Assessment Battery (Language Module, Form 1): T-score Percentile      Naming 41 18      Verbal Fluency: T-score Percentile      Controlled Oral Word Association (F-A-S) 28 2      Semantic Fluency (Animals) 31 3      Memory:        Neuropsychological Assessment Battery (Memory Module, Form 1): T-score Percentile      List Learning           List A Immediate Recall   (3 - 3 - 5) 26 1         List B Immediate Recall   (4) 47 38         List A Short Delayed Recall   (4) 23 <1         List A Long Delayed Recall    (3) 29 2         List A Long Delayed Yes/No Recognition Hits   (7) 24 <1         List A Long Delayed Yes/No Recognition False Alarms   (4) 48 42         List A Recognition Discriminability Index 37 9     Story Learning           Immediate Recall   (18, 26) 34 5         Delayed Recall   (28) 41 18      Daily Living Memory            Immediate Recall   (16, 11) 29 2          Delayed Recall   (5, 1) 19 <1          Recognition Hits 36 8      Repeatable Battery for the Assessment of Neuropsychological Status (Form A): Scaled  Score Percentile         Figure Recall   (1) 1 <1      Visuospatial/Constructional Functioning        Repeatable Battery for the Assessment of Neuropsychological Status (Form A): Standard/Scaled Score Percentile     Visuospatial/Constructional Index 53 <1         Figure Copy 1 <1         Judgment of Line Orientation --- <2      [place]-Russ[place] Neuropsychological Battery T-score Percentile      [place] Drawing 12 1      Executive Functioning        Modified [place] Card Sorting Test (MWCST): Standard/T-Score Percentile      Number of Categories Correct 19 <1      Number of Perseverative Errors 31 3      Number of Total Errors 24 <1      Percent Perseverative Errors 44 27  Executive Function Composite 59 <1      Trail Making Test: T-Score Percentile      Part A 13 <1      Part B 10 <1      [place] Diagnostic Aphasia Exam: Raw Score Scaled Score      Complex Ideational Material 12 12      Clock Drawing Raw Score Descriptor      Command 4 Moderate Impairment      Rating Scales        Quick Dementia Rating System Raw Score Descriptor      Sum of Boxes 4 Very Mild Dementia      Total Score 6 Mild Dementia  Geriatric Depression Scale - Short Form 3 Within Normal Limits    Peter V. Nicole Kindred PsyD, [place] Clinical Neuropsychologist

## 2020-01-03 ENCOUNTER — Ambulatory Visit (INDEPENDENT_AMBULATORY_CARE_PROVIDER_SITE_OTHER): Payer: Medicare Other | Admitting: Counselor

## 2020-01-03 ENCOUNTER — Encounter: Payer: Self-pay | Admitting: Counselor

## 2020-01-03 ENCOUNTER — Other Ambulatory Visit: Payer: Self-pay

## 2020-01-03 DIAGNOSIS — F0390 Unspecified dementia without behavioral disturbance: Secondary | ICD-10-CM | POA: Diagnosis not present

## 2020-01-03 DIAGNOSIS — F03A0 Unspecified dementia, mild, without behavioral disturbance, psychotic disturbance, mood disturbance, and anxiety: Secondary | ICD-10-CM

## 2020-01-03 NOTE — Progress Notes (Signed)
NEUROPSYCHOLOGICAL EVALUATION [facility]  Patient Name: Brandon Nielsen MRN: [id number] Date of Birth: 1956/09/16 Age: 64 y.o. Education: 19 years  Clinical Impressions  Brandon Nielsen is a 64 y.o., right-hand dominant, married man with a history of memory and thinking problems over the past two years that have been worsening over time. His wife notices that he has a hard time "processing information," making decisions (such as when assembling plates for dinner), with numbers (e.g. counting money), and he has some changes in handwriting. His is not forgetting entire events or demonstrating rapid forgetting. There is some functional decline with minor changes in driving, difficulties at work, having a hard time using the computer, and she took over finances when he had a hard time doing taxes. His MRI shows a moderate and likely pathological degree of atrophy in a patchy distribution (R > L in the posterior portions of the brain and L > R in the frontal portions) with additional findings suggestive of CAA. He has not seen neurology.   On neuropsychological testing, Brandon Nielsen demonstrated profound and striking visuospatial impairment including extremely low scores on measures of perceptual functioning and marked constructional apraxia. He also had some difficulties on measures of executive functioning. Fundamental language abilities were good but he generated low scores on indicators of verbal fluency. Speed of processing could not be assessed related to extreme difficulty interacting with test forms visually. Interestingly, his memory is relatively spared. He did have encoding problems but is retaining information across time and did very well with structured verbal information, which may suggest an executive contribution. His wife rated him as functioning at a mild dementia level and he screened negative for the presence of depression.   The findings are highly suggestive of an  underlying dementia syndrome resembling posterior cortical atrophy, currently at a mild dementia level of progression. Corticobasal syndrome is within the broad differential and he would benefit from a visit with neurology to check for any signs of Parkinsonism or other movement issues. I would also like to see him for additional neurobehavioral testing to clarify any cortical motor/sensory problems. With findings suggestive of CAA in conjunction with his behavioral presentation, I am most worried his issues are due to Alzheimer's disease but a tauopathy is also possible. Alpha synucleinpathies felt to be less likely related to lack of supportive signs/symptoms (no prominent Parkinsonism, no visual hallucinations, no RBD). Given his level of progression the condition is considered early onset and early onset presentations of AD can often have atypical/posterior cortical features.   Diagnostic Impressions: Mild dementia  Possible Alzheimer's disease  Recommendations to be discussed with patient  Your performance and presentation on assessment were consistent with significant and notable impairment in visuospatial/constructional abilities, some degree of executive difficulty, and relatively better performance on measures of language and memory. I do think that you have reached a dementia level of function, which is simply a term to say that there are significant cognitive difficulties and that these difficulties are significantly severe so as to compromise your day-to-day functioning.   Dementia refers to a group of syndromes where multiple areas of ability are damaged in the brain, such as memory, thinking, judgment, and behavior, and most commonly refer to age related causes of dementia that cause worsening in these abilities over time. Alzheimer's disease is the most common form of dementia in people over the age of 33. Not all dementias are Alzheimer's disease, but all Alzheimer's disease is dementia.  When dementia  is due to an underlying condition affecting the brain, such as Alzheimer's disease, there is progression over time, which typically procedes gradually over many years.   This is an uncommon pattern of difficulties given that Alzheimer's disease, the most common cause of dementia in individuals over the age of 39, is mainly associated with language and memory problems. With that said, Alzheimer's disease and dementia can take many different forms. One of these forms is a so-called "visual variant," often called posterior cortical atrophy. Your clinical presentation most resembles posterior cortical atrophy but there are other dementia syndromes that can appear similar and I think it would be a good idea for you to be examined by neurology to check for signs/symptoms of movement problems that can sometimes go along with these other syndromes. We could also have another visit where I could test you for signs of cortical sensory loss and additional signs/symptoms that would allow me to further subtype your dementia.   It would be reasonable to consider a trial of a first-line antidementia medication, such as a cholinesterase inhibitor, which you could discuss with your PCP or with neurology pending your upcoming visit.   We can discuss ways to promote longevity, maximize cognitive functioning day-to-day, and information resources at your next appointment.   Test Findings  Test scores are summarized in additional documentation associated with this encounter. Test scores are relative to age, gender, and educational history as available and appropriate. There were no concerns about performance validity, as the patient appeared to be putting forth good effort and scored within normal limits on one validity measure. He performed markedly below expectation on another such measure although it is almost certainly due to his visuospatial difficulties as opposed to a validity problem, when considered in the  context of his other test data.    General Intellectual Functioning/Achievement:  Performance on single word reading was average, with average to high average presenting as a reasonable standard of comparison for Mr. Germond cognitive test data given his level of occupational and educational achievement.   Attention and Processing Efficiency: Performance on measures of attention and processing efficiency was mixed with the latter likely being undermined by Mr. Krinke difficulties interacting visually with test materials. Digit repetition forward was average whereas digit repetition backward was extremely low, suggesting problems with working memory. He also had notable difficulties with serial subtractions of 7 from 100, with 1/5 correct.   With respect to processing efficiency, there was no frank bradyphrenia qualitatively. Performance was extremely low on all standard measures, including simple numeric sequencing and timed number-symbol coding (though it was really nearly impossible for him to engage appropriately in those tasks related to the extent of visuospatial dysfunction).   Language: Performance was qualitatively better preserved on language measures, including intact performance on the fundamental ability of visual object confrontation naming. Performance on timed indicators was lower, however, and he generated an unusually low score for animal fluency and an extremely low score for phonemic fluency.   Visuospatial Function: Profound and significant impairment was demonstrated on all visuospatial measures with an index score at the lower most extreme possible on the visuospatial/constructional index of the RBANS. Judgment of angular line orientations was extremely low. On figure copy, his drawing had simultanagnostic qualities, with poor gestalt formation, repeated details, and very significant spatial distortions. [place] cross drawing was also distorted and fell at an extremely low level.     Learning and Memory: Performance on measures of learning and memory was relatively preserved compared  to other areas although he did demonstrate difficulties with encoding of unstructured verbal information and with delayed recall of visual information (the latter of which is likely due to perceptual and constructional problems).   In the verbal realm, immediate recall of a 12-item word list was 3, 3, and 5 words across three trials, which is extremely low. He did recall 4 of those words on short delayed recall and 3 words on long delayed recall. While still generating extremely low scores, that performance is a demonstration of some residual storage capacity. He also achieved a low average score on delayed recognition of the words contained amongst false choices. He did much better with immediate and delayed recall of a short story, with unusually low immediate recall and low average delayed recall. Daily living memory was extremely low for immediate recall and delayed recall but his recognition was unusually low and thus just a bit better.  In the visual realm, initial perceptual difficulties and constructional difficulties likely undermined his performance, which was extremely low with respect to delayed recall of a modestly complex figure stimulus.    Executive Functions: Performance on executive measures was below expectation for the most part although many of these measures do have a visual component and thus I question if they are not artificially reduced. There was no frank dysexecutive behavior such as stimulus bound behavior, stickiness, or perseverative behavior. Alternating sequencing of numbers and letters of the alphabet was extremely low. Performance was extremely low on the Modified [employer], a visually mediated rule-based categorization procedure involving cognitive flexibility and abstract thinking. Clock drawing was suggestive of "moderate impairment" with distortion  of the clock face, distortion of the numbers and missing numbers, and hands markedly out of course. Generation of words in response to the letters F-A-S was extremely low. He did better on a measure involving attending to and then reasoning with verbal information on which he achieved an errorless score.   Rating Scale(s): Mr. Shipes wife rated him as functioning somewhere from a very mild to mild dementia level on severity status staging. He screened negative for the presence of depression.   Viviano Simas Nicole Kindred PsyD, [place] Clinical Neuropsychologist

## 2020-01-03 NOTE — Patient Instructions (Addendum)
Your performance and presentation on assessment were consistent with significant and notable impairment in visuospatial/constructional abilities, some degree of executive difficulty, and relatively better performance on measures of language and memory. I do think that you have reached a dementia level of function, which is simply a term to say that there are significant cognitive difficulties and that these difficulties are significantly severe so as to compromise your day-to-day functioning.   Dementia refers to a group of syndromes where multiple areas of ability are damaged in the brain, such as memory, thinking, judgment, and behavior, and most commonly refer to age related causes of dementia that cause worsening in these abilities over time. Alzheimer's disease is the most common form of dementia in people over the age of 6. Not all dementias are Alzheimer's disease, but all Alzheimer's disease is dementia. When dementia is due to an underlying condition affecting the brain, such as Alzheimer's disease, there is progression over time, which typically procedes gradually over many years.   This is an uncommon pattern of difficulties given that Alzheimer's disease, the most common cause of dementia in individuals over the age of 23, is mainly associated with language and memory problems. With that said, Alzheimer's disease and dementia can take many different forms. One of these forms is a so-called "visual variant," often called posterior cortical atrophy. Your clinical presentation most resembles posterior cortical atrophy but there are other dementia syndromes that can appear similar and I think it would be a good idea for you to be examined by neurology to check for signs/symptoms of movement problems that can sometimes go along with these other syndromes. I will discuss a referral with Dr. Okey Dupre.   Our next visit will be April 1st at 2:30pm, at which point we can do additional neurobehavioral  status testing that will help clarify your diagnosis and I can provide further counseling, diagnostic, and prognostic information at that appointment.   We discussed ways of maintaining cognitive longevity, including diet and exercise.   Exercise is one of the best medicines for promoting health and maintaining cognitive fitness at all stages in life. Exercise probably has the largest documented effect on brain health and performance of any intervention. Studies have shown that even previously sedentary individuals who start exercising as late as age 25 show a significant survival benefit as compared to their non-exercising peers. In the [place], the current guidelines are for 30 minutes of moderate exercise per day, but increasing your activity level less than that may also be helpful. You do not have to get your 30 minutes of exercise in one shot and exercising for short periods of time spread throughout the day can be helpful. Go for several walks, learn to dance, or do something else you enjoy that gets your body moving. Of course, if you have an underlying medical condition or there is any question about whether it is safe for you to exercise, you should consult a medical treatment provider prior to beginning exercise.   There is now good quality evidence from at least one large scale study that a modified mediterranean diet may help slow cognitive decline. This is known as the "MIND" diet. The Mind diet is not so much a specific diet as it is a set of recommendations for things that you should and should not eat.   Foods that are ENCOURAGED on the MIND Diet:  Green, leafy vegetables: Aim for six or more servings per week. This includes kale, spinach, cooked greens and salads.  All other vegetables: Try to eat another vegetable in addition to the green leafy vegetables at least once a day. It is best to choose non-starchy vegetables because they have a lot of nutrients with a low number of  calories.  Berries: Eat berries at least twice a week. There is a plethora of research on strawberries, and other berries such as blueberries, raspberries and blackberries have also been found to have antioxidant and brain health benefits.  Nuts: Try to get five servings of nuts or more each week. The creators of the [place] don't specify what kind of nuts to consume, but it is probably best to vary the type of nuts you eat to obtain a variety of nutrients. Peanuts are a legume and do not fall into this category.  Olive oil: Use olive oil as your main cooking oil. There may be other heart-healthy alternatives such as algae oil, though there is not yet sufficient research upon which to base a formal recommendation.  Whole grains: Aim for at least three servings daily. Choose minimally processed grains like oatmeal, quinoa, brown rice, whole-wheat pasta and 100% whole-wheat bread.  Fish: Eat fish at least once a week. It is best to choose fatty fish like salmon, sardines, trout, tuna and mackerel for their high amounts of omega-3 fatty acids.  Beans: Include beans in at least four meals every week. This includes all beans, lentils and soybeans.  Poultry: Try to eat chicken or [place] at least twice a week. Note that fried chicken is not encouraged on the MIND diet.  Wine: Aim for no more than one glass of alcohol daily. Both red and white wine may benefit the brain. However, much research has focused on the red wine compound resveratrol, which may help protect against Alzheimer's disease.  Foods that are DISCOURAGED on the MIND Diet: Butter and margarine: Try to eat less than 1 tablespoon (about 14 grams) daily. Instead, try using olive oil as your primary cooking fat, and dipping your bread in olive oil with herbs.  Cheese: The MIND diet recommends limiting your cheese consumption to less than once per week.  Red meat: Aim for no more than three servings each week. This includes all beef, pork, lamb and  products made from these meats.  [place] food: The MIND diet highly discourages fried food, especially the kind from fast-food restaurants. Limit your consumption to less than once per week.  Pastries and sweets: This includes most of the processed junk food and desserts you can think of. Ice cream, cookies, brownies, snack cakes, donuts, candy and more. Try to limit these to no more than four times a week.

## 2020-01-03 NOTE — Progress Notes (Signed)
   [facility]  I met with Bernie Covey Conwell to review the findings resulting from his neuropsychological evaluation. Since the last appointment, things have been about the same. Mr. Schubert shared his experience of the testing, which he said was somewhat emotional for him and very challenging, and we processed that. Time was spent reviewing the impressions and recommendations that are detailed in the evaluation report. Areas of emphasis included impression of very mild to mild level dementia with posterior cortical features, albeit with a differential at this point. I would like to see him back for further testing of cortical motor/sensory functioning and think that he will benefit with a visit from neurology to establish care and rule out any movement issues. We discussed diet, exercise, and medications as best-practice treatment at this point. Interventions provided during this encounter included psychoeducation, and other topics as reflected in the patient instructions. I took time to explain the findings and answer all the patient's questions. I encouraged Mr. Mundt to contact me should he have any further questions or if further follow up is desired.   Current Medications and Medical History   Current Outpatient Medications  Medication Sig Dispense Refill  . ipratropium (ATROVENT) 0.06 % nasal spray PLACE 2 SPRAYS INTO BOTH NOSTRILS 4 TIMES A DAY. 15 mL 11  . prednisoLONE acetate (PRED FORTE) 1 % ophthalmic suspension     . prednisoLONE acetate (PRED MILD) 0.12 % ophthalmic suspension Place 1 drop into both eyes daily.    . RABEprazole (ACIPHEX) 20 MG tablet Take 1 tablet (20 mg total) by mouth daily. 90 tablet 3  . ranitidine (ZANTAC) 150 MG capsule Take 150 mg by mouth as needed for heartburn.    . Vitamin D, Ergocalciferol, (DRISDOL) 1.25 MG (50000 UNIT) CAPS capsule Take 1 capsule (50,000 Units total) by mouth every 7 (seven) days. 12 capsule 0   No  current facility-administered medications for this visit.    Patient Active Problem List   Diagnosis Date Noted  . Memory loss 11/18/2019  . Routine health maintenance 11/08/2012  . Obstructive sleep apnea 01/09/2008  . GASTROESOPHAGEAL REFLUX DISEASE 01/09/2008    Mental Status and Behavioral Observations  Mr. Mellinger was available on time for this telephonic appointment and was attended by his [name]. He was alert and generally oriented (orientation not formally assessed). His speech was normal in rate, rhythm, and volume. Mood was "life has been pretty good" and affect was mainly neutral. Mr. Adriano thought process was largely logical, linear, and goal-oriented. His thought content was appropriate to the topics discussed. No safety concerns were identified at today's visit.   Plan  Feedback provided regarding the patient's neuropsychological evaluation. I would like to see him back for further neurobehavioral testing to clarify his diagnostic status. I will also liaison with Dr. Sharlet Salina regarding a referral to neurology and medications. Bernie Covey Estill was encouraged to contact me if any questions arise or if further follow up is desired.   Viviano Simas Nicole Kindred, PsyD, [employer] Clinical Neuropsychologist  Service(s) Provided at This Encounter: 50 minutes [phone]; Conjoint therapy with patient present)

## 2020-01-13 ENCOUNTER — Ambulatory Visit (INDEPENDENT_AMBULATORY_CARE_PROVIDER_SITE_OTHER): Payer: Medicare Other | Admitting: Counselor

## 2020-01-13 ENCOUNTER — Encounter: Payer: Self-pay | Admitting: Internal Medicine

## 2020-01-13 ENCOUNTER — Encounter: Payer: Self-pay | Admitting: Counselor

## 2020-01-13 ENCOUNTER — Other Ambulatory Visit: Payer: Self-pay

## 2020-01-13 DIAGNOSIS — R481 Agnosia: Secondary | ICD-10-CM

## 2020-01-13 DIAGNOSIS — F0280 Dementia in other diseases classified elsewhere without behavioral disturbance: Secondary | ICD-10-CM | POA: Diagnosis not present

## 2020-01-13 DIAGNOSIS — R41842 Visuospatial deficit: Secondary | ICD-10-CM

## 2020-01-13 DIAGNOSIS — G300 Alzheimer's disease with early onset: Secondary | ICD-10-CM | POA: Diagnosis not present

## 2020-01-13 NOTE — Progress Notes (Signed)
[facility]  I met with Brandon Nielsen to conduct additional neurobehavioral status examination and provide further counseling regarding his diagnosis of mild dementia. On exam, he demonstrated difficulties with finger gnosis, profound dyscalculia, and borderline reading of degraded numbers. He is borderline for meeting formal Crutch criteria for PCA, with  acalculia, finger agnosia, and constructional dyspraxia. 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 He did well on tasks involving left/right discrimination. His atrophy pattern is also not classic for PCA with more frontal involvement than would be expected.   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 We re-reviewed brain health and longevity preservation strategies including diet, exercise, and the possible roll of medications. I provided him with handouts on PCA and local resources. He will consult with Dr. Delice Lesch for a neurological opinion and to discuss medications. I recommended that he begin to transition away from work. I also recommended that he monitor driving very carefully and consider stopping, although at this point I do not think that it warrants reporting, as he and his wife wish to monitor the situation closely on their own. Interventions included solution-focused therapy, psychoeducation, and care coordination.   Current Medications and Medical History   Current Outpatient Medications  Medication Sig Dispense Refill  . ipratropium (ATROVENT) 0.06 % nasal spray PLACE 2 SPRAYS INTO BOTH NOSTRILS 4 TIMES A DAY. 15 mL 11  . prednisoLONE acetate (PRED FORTE) 1 % ophthalmic suspension     . prednisoLONE acetate (PRED MILD) 0.12 % ophthalmic suspension Place 1 drop into both eyes daily.    . RABEprazole (ACIPHEX) 20 MG tablet Take 1 tablet (20 mg total) by mouth daily. 90 tablet 3  .  ranitidine (ZANTAC) 150 MG capsule Take 150 mg by mouth as needed for heartburn.    . Vitamin D, Ergocalciferol, (DRISDOL) 1.25 MG (50000 UNIT) CAPS capsule Take 1 capsule (50,000 Units total) by mouth every 7 (seven) days. 12 capsule 0   No current facility-administered medications for this visit.    Patient Active Problem List   Diagnosis Date Noted  . Memory loss 11/18/2019  . Routine health maintenance 11/08/2012  . Obstructive sleep apnea 01/09/2008  . GASTROESOPHAGEAL REFLUX DISEASE 01/09/2008    Neurobehavioral Status Examination and Mental Status  Brandon Nielsen arrived to the appointment on time and attended by his wife Brandon Nielsen. He was alert and generally oriented. His self-reported mood was "worried" and his affect was congruent, clearly dealing with the diagnosis is particularly challenging for him given his preserved insight. His thought process was logical, linear, and goal-directed and his thought content was appropriate.      Right Left Graphesthesia:  1/4 2/4 Praxis:   4/4 4/4 *Some minor limb position errors noted L>R but no frank ideomotor apraxia Finger Gnosis:  1/4 1/4  Brandon Nielsen had notable difficulties on the intermanual inbetween test for fine-grained finger gnosis on both hands. No difficulties with identifying left/right with interlocking fingers.   Mental Calculation: Mental Addition: 0/3  <10%ile Mental Subtraction: 0/3 <10%ile  Degraded Numbers (Addenbrooke's): 3/4 (Impaired)  He was able to describe the cookie theft picture in a reasonable amount of detail and did not demonstrate difficulties perceiving the scene as a whole.  Paragraph reading was borderline, he did seem to have some difficulties with visual scanning and made minor errors but was fairly fluent.    Plan & Impression  This is a very  pleasant 64 year old gentleman with a history of cognitive decline over the past two years that have been gradually worsening over time. He has problems with  numbers, counting money, some changes in hand writing, and relatively preserved memory function. His wife took over finances after he had a hard time doing the taxes, changes have been noticed at work, and he has a hard time using a computer but nonetheless is still working. He has an MRI that shows a moderate and likely pathological degree of atrophy in a patchy distribution (R>L posterio, L>R frontal) and some additional findings of microhemorrhage. 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 He is currently at a mild dementia level of progression. He will follow up with Dr. Delice Lesch for medications and a neurological opinion, which will also be helpful to see if there are any movement features that haven't been noticed.   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 Brandon Kindred, PsyD, ABN Clinical Neuropsychologist  Service(s) Provided at This Encounter: >30 minutes (48185; Neurobehavioral status examination) 35 minutes (63149; Conjoint therapy with patient present)

## 2020-01-17 ENCOUNTER — Encounter: Payer: Self-pay | Admitting: Internal Medicine

## 2020-01-17 NOTE — Telephone Encounter (Signed)
Notes from neuro suggested that they have gone over the results with patient. It sounded like neuro wanted pt to follow up with them if he had any questions.

## 2020-01-18 NOTE — Telephone Encounter (Signed)
Already a mychart message about this.

## 2020-02-18 ENCOUNTER — Ambulatory Visit: Payer: Medicare Other | Admitting: Neurology

## 2020-03-28 ENCOUNTER — Telehealth: Payer: Self-pay

## 2020-03-28 NOTE — Telephone Encounter (Signed)
Phone call to pt. In follow-up to fax received on 03/27/20, requesting documentation of COVID 19 vaccinations from Feb/ March 2021.  Left vm. For pt. To return call to [phone] to discuss options for pt. To receive records.

## 2020-03-29 NOTE — Telephone Encounter (Signed)
Pt. Returned call 03/28/20.  Stated he needed copy of COVID vaccines, as he will be traveling in about 2 mos., and will need proof of having rec'd the vaccines.  Stated he misplaced his card.  Confirmed that pt. was given Pfizer COVID vaccine on 11/22/19 and 12/16/19.  Pt requested to email this information to him.  Advised that it is [facility] policy, not to email any medical information, due to HIPPA guidelines.  Offered to pt. that this information can be mailed to him, since he will not be traveling for approx. 2 mos.  Pt. In agreement.    Per pt. request, mailed the documentation supporting pt's. COVID vaccines, given on 11/22/19 and 12/16/19, to his home address.

## 2020-04-18 ENCOUNTER — Ambulatory Visit: Payer: Medicare Other | Admitting: Neurology

## 2020-08-01 ENCOUNTER — Ambulatory Visit (INDEPENDENT_AMBULATORY_CARE_PROVIDER_SITE_OTHER): Payer: Medicare Other | Admitting: Internal Medicine

## 2020-08-01 ENCOUNTER — Encounter: Payer: Self-pay | Admitting: Internal Medicine

## 2020-08-01 VITALS — Temp 98.1°F

## 2020-08-01 DIAGNOSIS — Z20822 Contact with and (suspected) exposure to covid-19: Secondary | ICD-10-CM | POA: Diagnosis not present

## 2020-08-01 NOTE — Patient Instructions (Signed)
It was a pleasure to see you today.  COVID-19 PCR test obtained and results pending.  We will notify you as soon as we receive the results

## 2020-08-01 NOTE — Progress Notes (Signed)
   Subjective:    Patient ID: Brandon Nielsen, male    DOB: 1956-10-03, 64 y.o.   MRN: [id number]  HPI 64 year old Male with and has received third exposure on Friday October 15 during a meeting with a renal Therapist, occupational.  He and his sister were both exposed at the same time.  Patient has been vaccinated for COVID-19 in February and March of this year and has recently received third vaccine dose as well.  No nasal congestion or sore throat.  Patient is planning to travel to [place] on Friday, October 22 and would like to be tested for COVID-19 prior to his departure to [place].  Review of Systems no symptoms of dysgeusia, fever, chills, headache, nausea or vomiting.  No myalgias.    Objective:   Physical Exam Temperature 98.1 degrees.  He is seen today in no acute distress.  COVID-19 PCR test obtained by nasal swab.       Assessment & Plan:  COVID-19 exposure  Plan: COVID-19 PCR test obtained today by nasal swab.  Results are pending.  Patient is currently asymptomatic.

## 2020-08-02 LAB — SARS-COV-2 RNA,(COVID-19) QUALITATIVE NAAT: SARS CoV2 RNA: NOT DETECTED

## 2020-11-06 ENCOUNTER — Other Ambulatory Visit: Payer: Self-pay | Admitting: Internal Medicine

## 2020-11-21 ENCOUNTER — Other Ambulatory Visit: Payer: Self-pay | Admitting: Internal Medicine

## 2020-11-27 ENCOUNTER — Telehealth: Payer: Self-pay | Admitting: Internal Medicine

## 2020-11-27 DIAGNOSIS — F0280 Dementia in other diseases classified elsewhere without behavioral disturbance: Secondary | ICD-10-CM

## 2020-11-27 DIAGNOSIS — G300 Alzheimer's disease with early onset: Secondary | ICD-10-CM

## 2020-11-27 NOTE — Telephone Encounter (Signed)
Referral placed.

## 2020-11-27 NOTE — Telephone Encounter (Signed)
   Request new referral to Dr Patrcia Dolly Patient did not schedule last referral, new referral needed per spouse. She called office for appointment and referral was closed

## 2020-11-27 NOTE — Telephone Encounter (Signed)
See below

## 2020-12-04 ENCOUNTER — Telehealth: Payer: Self-pay | Admitting: Internal Medicine

## 2020-12-04 ENCOUNTER — Encounter: Payer: Self-pay | Admitting: Neurology

## 2020-12-04 NOTE — Telephone Encounter (Signed)
Patients wife wondering if we can order the patient an MRI for his alzheimers because she has seen a progression and she thinks it will help before they see Dr. Karel Jarvis in may

## 2020-12-05 ENCOUNTER — Encounter: Payer: Self-pay | Admitting: Internal Medicine

## 2020-12-05 DIAGNOSIS — F0280 Dementia in other diseases classified elsewhere without behavioral disturbance: Secondary | ICD-10-CM

## 2020-12-05 DIAGNOSIS — G300 Alzheimer's disease with early onset: Secondary | ICD-10-CM

## 2020-12-06 NOTE — Telephone Encounter (Signed)
Patient's wife sent a mychart message. Mychart message was forwarded to Dr. Okey Dupre.

## 2020-12-12 ENCOUNTER — Other Ambulatory Visit: Payer: Self-pay | Admitting: Internal Medicine

## 2020-12-26 ENCOUNTER — Ambulatory Visit
Admission: RE | Admit: 2020-12-26 | Discharge: 2020-12-26 | Disposition: A | Discharge status: Home / Self Care | Payer: Medicare Other | Source: Ambulatory Visit | Attending: Internal Medicine | Admitting: Internal Medicine

## 2020-12-26 ENCOUNTER — Other Ambulatory Visit: Payer: Self-pay

## 2020-12-26 DIAGNOSIS — F0280 Dementia in other diseases classified elsewhere without behavioral disturbance: Secondary | ICD-10-CM

## 2020-12-28 ENCOUNTER — Other Ambulatory Visit: Payer: Self-pay | Admitting: Internal Medicine

## 2021-01-09 ENCOUNTER — Other Ambulatory Visit: Payer: Self-pay | Admitting: Internal Medicine

## 2021-01-31 ENCOUNTER — Other Ambulatory Visit: Payer: Self-pay | Admitting: Internal Medicine

## 2021-02-14 ENCOUNTER — Other Ambulatory Visit: Payer: Self-pay | Admitting: Internal Medicine

## 2021-03-02 ENCOUNTER — Encounter: Payer: Self-pay | Admitting: Neurology

## 2021-03-02 ENCOUNTER — Other Ambulatory Visit: Payer: Self-pay

## 2021-03-02 ENCOUNTER — Ambulatory Visit (INDEPENDENT_AMBULATORY_CARE_PROVIDER_SITE_OTHER): Payer: Medicare Other | Admitting: Neurology

## 2021-03-02 VITALS — BP 148/99 | HR 62 | Ht 76.0 in | Wt 211.0 lb

## 2021-03-02 DIAGNOSIS — G300 Alzheimer's disease with early onset: Secondary | ICD-10-CM | POA: Diagnosis not present

## 2021-03-02 DIAGNOSIS — F419 Anxiety disorder, unspecified: Secondary | ICD-10-CM

## 2021-03-02 DIAGNOSIS — F0281 Dementia in other diseases classified elsewhere with behavioral disturbance: Secondary | ICD-10-CM

## 2021-03-02 DIAGNOSIS — F32A Depression, unspecified: Secondary | ICD-10-CM | POA: Diagnosis not present

## 2021-03-02 MED ORDER — ESCITALOPRAM OXALATE 10 MG PO TABS: 10.0000 mg | ORAL_TABLET | Freq: Every day | ORAL | 11 refills | Status: DC

## 2021-03-02 NOTE — Progress Notes (Signed)
NEUROLOGY CONSULTATION NOTE  Brandon Nielsen MRN: [id number] DOB: 07-27-1956  Referring provider: Dr. Hillard Nielsen Primary care provider: Dr. Hillard Nielsen  Reason for consult:  Early onset dementia  Dear Dr Brandon Nielsen:  Thank you for your kind referral of Brandon Nielsen [name] for consultation of the above symptoms. Although his history is well known to you, please allow me to reiterate it for the purpose of our medical record. The patient was accompanied to the clinic by his wife Brandon Nielsen who also provides collateral information. Records and images were personally reviewed where available.   HISTORY OF PRESENT ILLNESS: This is a pleasant 65 year old left-handed man with a history of GERD, presenting to establish care for early onset Alzheimer's disease. His wife Brandon Nielsen is present to provide additional information. She started noticing confusion around 7-8 years ago, worse in the past year. She reports he has changed so much in his ability to function. She took over finances 2-3 years ago because he was having a harder time. She helps with medications and he takes them by himself, rarely making a mistake. He stopped driving a year ago. He is able to bathe independently but would put on clothes inside out. He underwent Neuropsychological testing with Dr. Roseanne Nielsen in March 2021 with note of profound and striking visuospatial impairment including extremely low scores on measures of perceptual functioning and marked constructional apraxia. He also had difficulties with executive functioning. Speed of processing could not be assessed related to extreme difficulty interacting with test forms visually. It was also noted that memory was relatively spared. 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  I personally reviewed brain MRI without contrast done in 12/2019 and  12/2019 did not show interval change, there was moderate diffuse volume loss, slightly more pronounced in the bilateral parietal lobes, right greater than left. It was noted that atrophy pattern is not classic for PCA. He was evaluated by [facility] dementia specialist Dr. Alois Nielsen in 01/2020 and it was felt that most likely underlying pathological process is Alzheimer's versus frontotemporal lobar degeneration. He was started on Donepezil which caused nausea and diarrhea, switched to Rivastigmine. This caused worse vomiting, weakness, so he resumed Donepezil 10mg  daily. He was last seen at [facility] in 09/2020. They have decided to stay locally for now and wanted to establish local care.  His wife reports that cognitive issues are not noticeable with brief conversations. He is more confused at night. They have an aide coming once a week. His son has noticed he has not idea what time of the day it is without cues. She drives him to the office where his sister also works, he sits at his desk and does not do anything. 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 He says he works a 5-day work week. He notes mild depression but he is able to function and does not feel the need to take medication. His wife however states that he is pretty depressed and gets emotional. She shows a video where he has difficulty following instructions, trying to eat with his knife. He starts crying saying he is tired of pushing himself. Sleep is pretty good, no wandering behaviors. No paranoia or hallucinations.   Laboratory Data: Lab Results  Component Value Date  TSH 0.83 11/17/2019   Lab Results  Component Value Date   VITAMINB12 228 11/17/2019     PAST MEDICAL HISTORY: Past Medical History:  Diagnosis Date  . Chicken pox    as a child  . Fuch's endothelial dystrophy   . GERD (gastroesophageal reflux disease)    off meds -  after loosing weight  . H/O infectious mononucleosis    in college  . Hepatitis     PAST SURGICAL HISTORY: Past Surgical History:  Procedure Laterality Date  . COLONOSCOPY    . 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 NASAL SEPTUM SURGERY     at 1 yrs of age    MEDICATIONS: Current Outpatient Medications on File Prior to Visit  Medication Sig Dispense Refill  . ipratropium (ATROVENT) 0.06 % nasal spray PLACE TWO SPRAYS INTO BOTH NOSTRILS FOUR TIMES DAILY. 15 mL 0  . prednisoLONE acetate (PRED FORTE) 1 % ophthalmic suspension     . prednisoLONE acetate (PRED MILD) 0.12 % ophthalmic suspension Place 1 drop into both eyes daily.    . RABEprazole (ACIPHEX) 20 MG tablet TAKE 1 TABLET ONCE DAILY. 30 tablet 0  . ranitidine (ZANTAC) 150 MG capsule Take 150 mg by mouth as needed for heartburn.    . Vitamin D, Ergocalciferol, (DRISDOL) 1.25 MG (50000 UNIT) CAPS capsule Take 1 capsule (50,000 Units total) by mouth every 7 (seven) days. 12 capsule 0   No current facility-administered medications on file prior to visit.    ALLERGIES: No Known Allergies  FAMILY HISTORY: Family History  Problem Relation Age of Onset  . Cancer Mother        cervical cancer/non-hodgikins lymphoma  . Hypertension Mother   . Colon polyps Mother   . Heart disease Father   . Hypertension Father     SOCIAL HISTORY: Social History   Socioeconomic History  . Marital status: Married    Spouse name: Not on file  . Number of children: 3  . Years of education: 14  . Highest education level: Not on file  Occupational History  . Occupation: Scientist, research (medical): [employer] Brandon Nielsen  Tobacco Use  . Smoking status: Never Smoker  . Smokeless tobacco: Never Used  Substance and Sexual Activity  . Alcohol use: Yes    Alcohol/week: 8.0 - 10.0 standard drinks    Types: 8 - 10 Standard drinks or equivalent per week  . Drug use: No  . Sexual activity: Yes    Partners: Female  Other Topics Concern  . Not on file   Social History Narrative   HS[employer], 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 Married '77- 13/divorced. Married '95.  3 adult children - '82, '85, '86 (step daughter). Work - TEFL teacher.                   Social Determinants of Health   Financial Resource Strain: Not on file  Food Insecurity: Not on file  Transportation Needs: Not on file  Physical Activity: Not on file  Stress: Not on file  Social Connections: Not on file  Intimate Partner Violence: Not on file     PHYSICAL EXAM: Vitals:   03/02/21 1049  BP: (!) 148/99  Pulse: 62  SpO2: 97%   General: No acute distress Head:  Normocephalic/atraumatic Skin/Extremities: No rash, no edema Neurological Exam: Mental status: alert and oriented to person, place, and time, no dysarthria or aphasia, Fund of knowledge is appropriate.  Recent and remote memory are impaired. Attention and concentration are reduced. Able to name objects and repeat phrases. Difficulty with executive function/visuospatial tasks. MOCA 16/30 Montreal Cognitive Assessment  03/02/2021 12/27/2019  Visuospatial/ Executive (0/5) 0 0  Naming (0/3) 3 3  Attention: Read list of digits (0/2) 2 2  Attention: Read list of letters (0/1) 0 1  Attention: Serial 7 subtraction starting at 100 (0/3) 1 1  Language: Repeat phrase (0/2) 2 2  Language : Fluency (0/1) 0 0  Abstraction (0/2) 2 1  Delayed Recall (0/5) 0 2  Orientation (0/6) 6 6  Total 16 18  Adjusted Score (based on education) 16 18    Cranial nerves: CN I: not tested CN II: pupils equal, round and reactive to light, visual fields intact CN III, IV, VI:  full range of motion, no nystagmus, no ptosis CN V: facial sensation intact CN VII: upper and lower face symmetric CN VIII: hearing intact to conversation CN XI: sternocleidomastoid and trapezius muscles intact CN XII: tongue midline Bulk & Tone: normal, no fasciculations. Motor: 5/5 throughout with no pronator  drift. Sensation: intact to light touch, cold, pin, vibration sense.  No extinction to double simultaneous stimulation.  Romberg test negative Deep Tendon Reflexes: +2 throughout, no ankle clonus Plantar responses: downgoing bilaterally Cerebellar: no incoordination on finger to nose testing Gait: narrow-based and steady, able to tandem walk adequately. Tremor: none   IMPRESSION: This is a pleasant 65 year old left-handed man with a history of GERD, presenting to establish care for early onset Alzheimer's disease, possibly posterior cortical atrophy. MOCA score today 16/30. We discussed diagnosis, prognosis, management. Continue Donepezil 10mg  daily. His wife is concerned about depression, increased emotionality, he is agreeable to start Lexapro 10mg  daily. Side effects discussed, they will update our office in 3-4 weeks, we can uptitrate if needed. He is having more difficulties with visuospatial tasks and will be referred for Occupational Therapy, as well as Speech therapy for cognitive therapy. We discussed the importance of control of vascular risk factors, physical exercise, brain stimulation exercises, MIND diet for brain health. Follow-up in 6 months, they know to call for any changes.   Thank you for allowing me to participate in the care of this patient. Please do not hesitate to call for any questions or concerns.   , M.D.  CC: Dr. 

## 2021-03-02 NOTE — Patient Instructions (Addendum)
Good to meet you!  1. Continue Donepezil 10mg  daily  2. Start Lexapro (Escitalopram) 10mg  daily. Send me an update on how you are feeling in 3-4 weeks  3. Refer for Occupational therapy and speech therapy (cognitive therapy)  4. Increase physical activity, look into seeing a personal trainer/[employer]  5. There are some activities which have therapeutic value and can be useful in keeping you cognitively stimulated. You can try this website: https://www.barrowneuro.org/get-to-know-barrow/centers-programs/neurorehabilitation-center/neuro-rehab-apps-and-games/ which has options, categorized by level of difficulty.  6. Follow-up in 6 months, call for any changes    RECOMMENDATIONS FOR ALL PATIENTS WITH MEMORY PROBLEMS: 1. Continue to exercise (Recommend 30 minutes of walking everyday, or 3 hours every week) 2. Increase social interactions - continue going to [place] and enjoy social gatherings with friends and family 3. Eat healthy, avoid fried foods and eat more fruits and vegetables 4. Maintain adequate blood pressure, blood sugar, and blood cholesterol level. Reducing the risk of stroke and cardiovascular disease also helps promoting better memory. 5. Avoid stressful situations. Live a simple life and avoid aggravations. Organize your time and prepare for the next day in anticipation. 6. Sleep well, avoid any interruptions of sleep and avoid any distractions in the bedroom that may interfere with adequate sleep quality 7. Avoid sugar, avoid sweets as there is a strong link between excessive sugar intake, diabetes, and cognitive impairment We discussed the Mediterranean diet, which has been shown to help patients reduce the risk of progressive memory disorders and reduces cardiovascular risk. This includes eating fish, eat fruits and green leafy vegetables, nuts like almonds and hazelnuts, walnuts, and also use olive oil. Avoid fast foods and fried foods as much as possible. Avoid sweets and sugar as  sugar use has been linked to worsening of memory function.  There is always a concern of gradual progression of memory problems. If this is the case, then we may need to adjust level of care according to patient needs. Support, both to the patient and caregiver, should then be put into place.

## 2021-03-06 ENCOUNTER — Other Ambulatory Visit: Payer: Self-pay

## 2021-03-06 MED ORDER — DONEPEZIL HCL 10 MG PO TABS: 10.0000 mg | ORAL_TABLET | Freq: Every day | ORAL | 1 refills | Status: DC

## 2021-03-13 ENCOUNTER — Ambulatory Visit: Payer: Medicare Other | Admitting: Neurology

## 2021-03-14 ENCOUNTER — Other Ambulatory Visit: Payer: Self-pay

## 2021-03-14 DIAGNOSIS — F0390 Unspecified dementia without behavioral disturbance: Secondary | ICD-10-CM

## 2021-03-14 DIAGNOSIS — F03A0 Unspecified dementia, mild, without behavioral disturbance, psychotic disturbance, mood disturbance, and anxiety: Secondary | ICD-10-CM

## 2021-03-14 DIAGNOSIS — F0280 Dementia in other diseases classified elsewhere without behavioral disturbance: Secondary | ICD-10-CM

## 2021-03-14 DIAGNOSIS — F09 Unspecified mental disorder due to known physiological condition: Secondary | ICD-10-CM

## 2021-03-16 ENCOUNTER — Other Ambulatory Visit: Payer: Self-pay

## 2021-03-16 DIAGNOSIS — F03A0 Unspecified dementia, mild, without behavioral disturbance, psychotic disturbance, mood disturbance, and anxiety: Secondary | ICD-10-CM

## 2021-03-16 DIAGNOSIS — F0390 Unspecified dementia without behavioral disturbance: Secondary | ICD-10-CM

## 2021-03-16 DIAGNOSIS — R41842 Visuospatial deficit: Secondary | ICD-10-CM

## 2021-04-04 ENCOUNTER — Ambulatory Visit: Payer: Medicare Other | Attending: Neurology | Admitting: Occupational Therapy

## 2021-04-04 ENCOUNTER — Other Ambulatory Visit: Payer: Self-pay | Admitting: Internal Medicine

## 2021-04-04 ENCOUNTER — Encounter: Payer: Self-pay | Admitting: Speech Pathology

## 2021-04-04 ENCOUNTER — Encounter: Payer: Self-pay | Admitting: Occupational Therapy

## 2021-04-04 ENCOUNTER — Ambulatory Visit: Payer: Medicare Other | Admitting: Speech Pathology

## 2021-04-04 ENCOUNTER — Other Ambulatory Visit: Payer: Self-pay

## 2021-04-04 DIAGNOSIS — R41844 Frontal lobe and executive function deficit: Secondary | ICD-10-CM | POA: Diagnosis not present

## 2021-04-04 DIAGNOSIS — R41842 Visuospatial deficit: Secondary | ICD-10-CM

## 2021-04-04 DIAGNOSIS — R41841 Cognitive communication deficit: Secondary | ICD-10-CM

## 2021-04-04 DIAGNOSIS — R29818 Other symptoms and signs involving the nervous system: Secondary | ICD-10-CM | POA: Diagnosis present

## 2021-04-04 NOTE — Therapy (Signed)
[facility] [address] [place], [place], [address] Phone: [phone]   Fax:  [phone]  Speech Language Pathology Evaluation  Patient Details  Name: Brandon Nielsen MRN: [id number] Date of Birth: November 02, 1955 Referring Provider (SLP): Van Clines, MD   Encounter Date: 04/04/2021   End of Session - 04/04/21 1515     Visit Number 1    Number of Visits 8    Date for SLP Re-Evaluation 07/05/21    SLP Start Time 0930    SLP Stop Time  1020    SLP Time Calculation (min) 50 min    Activity Tolerance Patient tolerated treatment well             Past Medical History:  Diagnosis Date   Chicken pox    as a child   Fuch's endothelial dystrophy    GERD (gastroesophageal reflux disease)    off meds - after loosing weight   H/O infectious mononucleosis    in college   Hepatitis     Past Surgical History:  Procedure Laterality Date   COLONOSCOPY     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   Subjective Assessment - 04/04/21 1513     Subjective Pt was alert and cooperative throughout today's session. Accompanied by wife, [name].    Patient is accompained by: Family member    Currently in Pain? No/denies                SLP Evaluation [facility] - 04/04/21 0001       SLP Visit Information   SLP Received On 04/04/21    Referring Provider (SLP) Van Clines, MD    Onset Date dx 01/13/20    Medical Diagnosis Alzheimer's Dementia      Subjective   Patient/Family Stated Goal Communication Strategies      Pain Assessment   Currently in Pain? No/denies      General Information   HPI pt is a 65 yo male w/ dx of alz dementia.      Balance Screen   Has the patient fallen in the past 6 months No      Prior Functional Status   Type of Home House     Lives With Spouse    Available Support Family;Friend(s)    Vocation  Other (Comment)   Working     Cognition   Overall Cognitive Status Impaired/Different from baseline    Area of Impairment Attention;Memory;Following commands;Safety/judgement;Awareness;Problem solving      Auditory Comprehension   Overall Auditory Comprehension Impaired    Commands Impaired    Conversation Complex    Interfering Components Processing speed;Working Press photographer of Expression Verbal      Verbal Expression   Overall Verbal Expression Impaired    Other Verbal Expression Comments rare anomia noted in conversation; related to decreased processing      Oral Motor/Sensory Function   Overall Oral Motor/Sensory Function Appears within functional limits for tasks assessed    Overall Oral Motor/Sensory Function WFL      Standardized Assessments   Standardized Assessments  Other Assessment    Other Assessment SLUMS, CADL, Clinical Swallow Exam (CSE)   Swallowing noted to be [facility]  SLU Mental Status (SLUMS Examination)  Orientation: 3/3  Delayed Recall w/ Interference: 1/5  Numeric Calculation and Registration: 0/3  Immediate Recall w/ Interference (Generative naming): 2/3  Registration and Digit Span: 0/2  Visual Spatial/Exec Functioning: 2/6  Executive Functioning/Extrapolation:  2/8  TOTAL: 10/30                 SLP Education - 04/04/21 1513     Education Details Edu re: Alz Dementia stages/progression; areas that SLP manage (speech, language, cognition, swallowing)    Person(s) Educated Patient;Spouse;Child(ren)   Spoke with daughter, Marliss Czar, on phone after session.   Methods Explanation;Demonstration;Handout    Comprehension Verbalized understanding;Need further instruction              SLP Short Term Goals - 04/04/21 1603       SLP SHORT TERM GOAL #1   Title Caregiver/patient will recall 3 memory strategies  from list that they can begin to implement to increase independence and reduce anxiety.    Time 4    Period Weeks    Status New      SLP SHORT TERM GOAL #2   Title Pt will collaborate with SLP on current safety precautions being implemented at home and generate a list of other safety measures that may be beneficial in the future.    Time 4    Period Weeks    Status New      SLP SHORT TERM GOAL #3   Title Caregiver/patient will demonstrate use of communication strategies to assist in preventing/repairing communication breakdowns.    Time 4    Period Weeks    Status New              SLP Long Term Goals - 04/04/21 1536       SLP LONG TERM GOAL #1   Title Caregiver/patient will report use of external memory strategies to assist with maintenance and recall of information.    Time 8    Period Weeks    Status New      SLP LONG TERM GOAL #2   Title Caregiver will report carry over of increased safety measures at home.    Time 8    Period Weeks    Status New      SLP LONG TERM GOAL #3   Title Caregiver will report use of communication strategies in preventing/repairing communication breakdowns at home.    Time 8    Period Weeks    Status New              Plan - 04/04/21 1516     Clinical Impression Statement Pt is a 65 yo male dx of Alz Dementia on 01/13/20. Pt was accompanied by wife, [name], to today's session. Informal observation of patient demonstrated strengths in conversational exchanges with others; however, noted need of increased processing time for expression/word finding. SLP noted some difficulty w/ comprehension of more complex information/following directions while completing SLUMS screen. Pt scored an 8/30 indicating global deficits. Pt was oriented x3 and had a relative strength in generative naming. Impairments noted in working and short term memory, cognitive flexibility, executive functioning and problem solving. SLP assessed benefits of cueing during SLUMS  - pt benefited from multiple choice cues and scaffolding of information. SLP began CADL; unable to complete due to time constraints - to complete next session. Clinical Swallow Exam (CSE) completed; WFL at this time.  Family goals for speech services include: phone navigation (problem solving), caregiver edu re: communication  strategies, environmental modifications, safety awareness, routine-based strategies. SLP rec skilled speech services for cognitive-communication impairement to increase pt's QOL and independence level as well as increase caregiver confidence.    Speech Therapy Frequency 2x / week    Duration 8 weeks    Treatment/Interventions Functional tasks;Patient/family education;Cueing hierarchy;Environmental controls;Cognitive reorganization;Multimodal communcation approach;Language facilitation;Compensatory techniques;Internal/external aids;SLP instruction and feedback    Potential to Achieve Goals Fair    Potential Considerations Ability to learn/carryover information    Consulted and Agree with Plan of Care Patient;Family member/caregiver    Family Member Consulted [name], Marliss Czar             Patient will benefit from skilled therapeutic intervention in order to improve the following deficits and impairments:   Cognitive communication deficit    Problem List Patient Active Problem List   Diagnosis Date Noted   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 [place], [place], [address] Phone: [phone]   Fax:  [phone]  Name: Brandon Nielsen MRN: [id number] Date of Birth: 1956-03-08

## 2021-04-04 NOTE — Addendum Note (Signed)
Addended by: Dorena Bodo on: 04/04/2021 04:34 PM   Modules accepted: Orders

## 2021-04-05 NOTE — Therapy (Signed)
[facility] [address] [place], [place], [address] Phone: [phone]   Fax:  [phone]  Occupational Therapy Evaluation  Patient Details  Name: Brandon Nielsen MRN: [id number] Date of Birth: December 31, 1955 Referring Provider (OT): Patrcia Dolly, MD (Neurology)   Encounter Date: 04/04/2021   OT End of Session - 04/04/21 0935     Visit Number 1    Number of Visits 9    Date for OT Re-Evaluation 07/03/21    Authorization Type Medicare Part A and B (primary); BCBS (secondary)    Authorization Time Period VL: [place]    Progress Note Due on Visit 10    OT Start Time 0845    OT Stop Time 0931    OT Time Calculation (min) 46 min    Behavior During Therapy [facility] for tasks assessed/performed             Past Medical History:  Diagnosis Date   Chicken pox    as a child   Fuch's endothelial dystrophy    GERD (gastroesophageal reflux disease)    off meds - after loosing weight   H/O infectious mononucleosis    in college   Hepatitis     Past Surgical History:  Procedure Laterality Date   COLONOSCOPY     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   Subjective Assessment - 04/04/21 0902     Subjective  Pt and his wife [name]) arrive to session today w/ primary concerns related to decreased memory and difficulty processing information, initially noticed about 2-3 years ago. Per pt, he is still going into work and is doing BADLs mostly independently, requiring occasional assist with correcting errors (e.g., putting a shirt on inside out); he also reports feeling like communicating effectively is a significant barrier at this time. Pt's wife, [name] agrees with this, but also reports difficulty w/ processing and following requests (e.g., holding a utensil backwards or putting the hose on the wrong way). Pt's wife also sent a message during  evaluation stating that when pt describes "going into work," it is more comparable to respite; he is spending time there w/ his sister, but is no longer participating in "day-to-day operations."    Patient is accompanied by: Family member   [name] (wife)   Pertinent History Early onset Alzheimer's dementia    Limitations Cognitive and executive function deficits    Patient Stated Goals Improve processing and communication    Currently in Pain? No/denies              [facility] OT Assessment - 04/04/21 0902       Assessment   Medical Diagnosis Early onset Alzheimer's dementia; visuospatial deficit    Referring Provider (OT) Patrcia Dolly, MD   Neurology   Hand Dominance Right      Precautions   Precautions Fall      Home  Environment   Type of Home House    Home Access Stairs    Entrance Stairs-Number of Steps 2-3    Home Layout One level    Bathroom Shower/Tub Walk-in Shower    Lives With Spouse   Wife [name]); dog     Prior Function   Vocation Full time employment    Corporate investment banker; Insurance account manager of office buildings in [place]    Leisure play golf  ADL   Eating/Feeding Supervision/safety    Grooming Supervision/safety    Upper Body Bathing Independent    Lower Body Bathing Independent    Upper Body Dressing --   Modified Independent   Lower Body Dressing Modified independent    ADL comments Difficulty w/ self-correction of errors (e.g., per wife's report, will have to help pt adjust shirt when he has donned it inside-out); 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      IADL   Shopping Completely unable to shop    Light Housekeeping Performs light daily tasks but cannot maintain acceptable level of cleanliness    Meal Prep Able to complete simple warm meal prep;Able to complete simple cold meal and snack prep;Does not utilize stove or oven    Prior Level of Function Community Mobility Was driving independently    Data processing manager Relies on  family or friends for transportation   Has not driven past year and half; has had formal driving evaluation   Prior Level of Function Medication Managment Wife started completing med mgmt a few years ago    Medication Management Takes responsibility if medication is prepared in advance in seperate dosage    Prior Level of Function Financial Management Wife started completing financial mgmt a few years ago    Financial Management Dependent      Written Expression   Handwriting Not legible    Written Experience Unable to assess (comment)   To be further tested in functional context     Vision - History   Baseline Vision Wears glasses all the time    Visual History Other (comment)   Hx of cornea transplant     Vision Assessment   Ocular Range of Motion Within Functional Limits    Tracking/Visual Pursuits Impaired - to be further tested in functional context    33% accuracy w/ 1 error during number cancellation activity   Saccades Decreased speed of saccadic movement    Convergence Within functional limits    Visual Fields No apparent deficits      Cognition   Overall Cognitive Status Impaired/Different from baseline    Area of Impairment Attention;Memory;Following commands;Safety/judgement;Awareness;Problem solving    Memory Comments Decreased working memory, short-term memory    Following Command Comments Difficulty w/ single- and multi-step instruction    Awareness Comments Decreased intellectual, emergent, and anticipatory awareness      Sensation   Light Touch Appears Intact   per pt report     Coordination   Finger Nose Finger Test Mild dysmetria    Box and Blocks 15 blocks in 30 sec w/ RUE (dominant)      Praxis   Praxis Impaired              OT Education - 04/05/21 1632     Education Details Education provided on role and purpose of OT, as well as potential interventions and goals for therapy    Person(s) Educated Patient;Spouse    Methods Explanation     Comprehension Verbalized understanding              OT Short Term Goals - 04/04/21 0944       OT SHORT TERM GOAL #1   Title Pt and caregiver will be able to verbally identify at least 3 home/environmental modification strategies to facilitate safe in-home mobility    Baseline Increased risk for falls    Time 4    Period Weeks    Status New    Target  Date 05/02/21      OT SHORT TERM GOAL #2   Title Pt will improve functional visual scanning by increasing success w/ target cancellation activities to at least 50% accuracy    Baseline 33% accuracy w/ number cancellation task    Time 4    Period Weeks    Status New      OT SHORT TERM GOAL #3   Title Pt will be able to complete simple alternating attention activity with Min A and at least 50% accuracy to improve participation in IADL tasks    Baseline Poor alternating attention; unable to complete Trail Making Test: Part A    Time 4    Period Weeks    Status New              OT Long Term Goals - 04/04/21 0944       OT LONG TERM GOAL #1   Title Pt and spouse will verbalize at least 3 compensatory strategies, including use of AE prn, to improve safety and independence w/ BADLs    Baseline 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, constructional apraxia, and visual perception    Time 8    Period Weeks    Status New    Target Date 05/30/21      OT LONG TERM GOAL #2   Title Patient will demonstrate appropriate visual scanning/awareness of objects during ADL/IADL task to maintain safety and awareness in functional living environment    Baseline Significant visual perceptual deficits    Time 8    Period Weeks    Status New      OT LONG TERM GOAL #3   Title Pt will be able to complete a simple IADL task w/ Min A (verbal cueing, extended time, compensatory strategies prn) for safety    Baseline Decreased participation in IADLs    Time 8    Period Weeks    Status New      OT LONG TERM GOAL #4   Title Pt  will be able to complete simple constructional apraxia-based activity w/ assist and use of compensatory strategies prn to improve participation in IADLs    Baseline Significant visuospatial deficits and difficulties w/ constructional apraxia    Time 8    Period Weeks    Status New             Plan - 04/04/21 1010     Clinical Impression Statement Pt is a 65 y.o. male who presents to OP OT related to early-onset Alzheimer's w/ deficits that pt and his wife initially noticed approximately 2-3 years ago. Pt currently lives with his wife in a single-level home, has adult children, and is no longer working or driving. PMHx includes Fuch's endothelial dystrophy, GERD, and hepatitis. Pt will benefit from skilled occupational therapy services to address coordination, cognition (alternating attention, working memory), praxis, safety awareness, compensatory strategies including AE if needed, visual-perception, and implementation of an HEP to improve participation and safety during daily activities.   OT Occupational Profile and History Detailed Assessment- Review of Records and additional review of physical, cognitive, psychosocial history related to current functional performance    Occupational performance deficits (Please refer to evaluation for details): ADL's;IADL's;Rest and Sleep;Work;Leisure;Social Participation    Body Structure / Function / Physical Skills ADL;Balance;Body mechanics;Endurance;Coordination;Vision;Mobility;Decreased knowledge of precautions    Cognitive Skills Attention;Learn;Memory;Orientation;Perception;Problem Solve;Safety Awareness;Sequencing;Understand    Psychosocial Skills Coping Strategies;Environmental  Adaptations;Habits;Interpersonal Interaction;Routines and Behaviors    Rehab Potential Fair    Clinical  Decision Making Several treatment options, min-mod task modification necessary    Comorbidities Affecting Occupational Performance: May have comorbidities impacting  occupational performance    Modification or Assistance to Complete Evaluation  Max significant modification of tasks or assist is necessary to complete    OT Frequency 1x / week    OT Duration 8 weeks    OT Treatment/Interventions Self-care/ADL training;Balance training;Therapeutic activities;Psychosocial skills training;Therapeutic exercise;Cognitive remediation/compensation;Coping strategies training;Visual/perceptual remediation/compensation;Functional Mobility Training;Neuromuscular education;Energy conservation;Manual Therapy;Patient/family education    Plan Review goals w/ pt    Consulted and Agree with Plan of Care Patient;Family member/caregiver    Family Member Consulted [name] (wife)             Patient will benefit from skilled therapeutic intervention in order to improve the following deficits and impairments:   Body Structure / Function / Physical Skills: ADL, Balance, Body mechanics, Endurance, Coordination, Vision, Mobility, Decreased knowledge of precautions Cognitive Skills: Attention, Learn, Memory, Orientation, Perception, Problem Solve, Safety Awareness, Sequencing, Understand Psychosocial Skills: Coping Strategies, Environmental  Adaptations, Habits, Interpersonal Interaction, Routines and Behaviors   Visit Diagnosis: Frontal lobe and executive function deficit  Visuospatial deficit  Cognitive communication deficit  Other symptoms and signs involving the nervous system    Problem List Patient Active Problem List   Diagnosis Date Noted   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 [place], [place], [address] Phone: [phone]   Fax:   [phone]  Name: Brandon Nielsen MRN: [id number] Date of Birth: 1956-05-15

## 2021-04-10 ENCOUNTER — Ambulatory Visit: Payer: Medicare Other | Admitting: Speech Pathology

## 2021-04-10 ENCOUNTER — Other Ambulatory Visit: Payer: Self-pay

## 2021-04-10 ENCOUNTER — Encounter: Payer: Self-pay | Admitting: Speech Pathology

## 2021-04-10 ENCOUNTER — Encounter: Payer: Self-pay | Admitting: Occupational Therapy

## 2021-04-10 ENCOUNTER — Ambulatory Visit: Payer: Medicare Other | Admitting: Occupational Therapy

## 2021-04-10 DIAGNOSIS — R41841 Cognitive communication deficit: Secondary | ICD-10-CM

## 2021-04-10 DIAGNOSIS — R29818 Other symptoms and signs involving the nervous system: Secondary | ICD-10-CM

## 2021-04-10 DIAGNOSIS — R41844 Frontal lobe and executive function deficit: Secondary | ICD-10-CM | POA: Diagnosis not present

## 2021-04-10 DIAGNOSIS — R41842 Visuospatial deficit: Secondary | ICD-10-CM

## 2021-04-10 NOTE — Therapy (Signed)
[facility] [address] [place], [place], [address] Phone: [phone]   Fax:  [phone]  Occupational Therapy Treatment  Patient Details  Name: Brandon Nielsen MRN: [id number] Date of Birth: 06/27/1956 Referring Provider (OT): Patrcia Dolly, MD (Neurology)   Encounter Date: 04/10/2021   OT End of Session - 04/10/21 0852     Visit Number 2    Number of Visits 9    Date for OT Re-Evaluation 07/03/21    Authorization Type Medicare Part A and B (primary); BCBS (secondary)    Authorization Time Period VL: [place]    Progress Note Due on Visit 10    OT Start Time 0847    OT Stop Time 0929    OT Time Calculation (min) 42 min    Activity Tolerance Patient tolerated treatment well    Behavior During Therapy [facility] for tasks assessed/performed             Past Medical History:  Diagnosis Date   Chicken pox    as a child   Fuch's endothelial dystrophy    GERD (gastroesophageal reflux disease)    off meds - after loosing weight   H/O infectious mononucleosis    in college   Hepatitis     Past Surgical History:  Procedure Laterality Date   COLONOSCOPY     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   Subjective Assessment - 04/10/21 0850     Subjective  Pt reports that he feels good after everything last week; "it was new, but I think you make a good team"    Patient is accompanied by: Family member   Brandon Nielsen (wife)   Pertinent History Early onset Alzheimer's dementia    Limitations Cognitive and executive function deficits    Patient Stated Goals Improve processing and communication    Currently in Pain? No/denies             OT Treatment/Exercises - 04/10/21 0926 Alternating attention task involving pt connecting dots in the order of the colors listed on the left side of the page; OT provided initial modeling,  verbal/gestural/visual cues, scaffolding, directing, and grading within the task to facilitate success and pt continued to experience difficulty w/ attention and visual perception  OT graded down demand of task to focus on visual scanning, w/ pt attempting to draw a continuous line connecting identical shapes. Pt was unable to connect more than 2 shapes together and made 1 error w/out recognition of mistake; OT then downgraded activity to involve simple visual scanning w/ increased success. Pt demo'd disorganized visual scanning pattern w/ OT providing breakdown of task, visual and gestural/visual cues, and extended processing time  Easy-grip peg activity copying simple 3-color pattern into small pegboard. Pt required breakdown of task, chaining, and verbal/visual cues to facilitate success; OT completed 1st row as a model, then pt was able to complete 2nd row w/ Max A and 3rd row w/ Min A    OT Education - 04/10/21 0859     Education Details Pt reviewed all goals w/ pt; pt is agreeable to POC at this time    Person(s) Educated Patient    Methods Explanation    Comprehension Verbalized understanding              OT Short Term Goals - 04/10/21 8756  OT SHORT TERM GOAL #1   Title Pt and caregiver will be able to verbally identify at least 3 home/environmental modification strategies to facilitate safe in-home mobility    Baseline Increased risk for falls    Time 4    Period Weeks    Status On-going    Target Date 05/02/21      OT SHORT TERM GOAL #2   Title Pt will improve functional visual scanning by increasing success w/ target cancellation activities to at least 50% accuracy    Baseline 33% accuracy w/ number cancellation task    Time 4    Period Weeks    Status On-going      OT SHORT TERM GOAL #3   Title Pt will be able to complete simple alternating attention activity with Min A and at least 50% accuracy to improve participation in IADL tasks    Baseline Poor  alternating attention; unable to complete Trail Making Test: Part A    Time 4    Period Weeks    Status On-going              OT Long Term Goals - 04/10/21 0856       OT LONG TERM GOAL #1   Title Pt and spouse will verbalize at least 3 compensatory strategies, including use of AE prn, to improve safety and independence w/ BADLs    Baseline 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, constructional apraxia, and visual perception    Time 8    Period Weeks    Status On-going      OT LONG TERM GOAL #2   Title Patient will demonstrate appropriate visual scanning/awareness of objects during ADL/IADL task to maintain safety and awareness in functional living environment    Baseline Significant visual perceptual deficits    Time 8    Period Weeks    Status On-going      OT LONG TERM GOAL #3   Title Pt will be able to complete a simple IADL task w/ Min A (verbal cueing, extended time, compensatory strategies prn) for safety    Baseline Decreased participation in IADLs    Time 8    Period Weeks    Status On-going      OT LONG TERM GOAL #4   Title Pt will be able to complete simple constructional apraxia-based activity w/ assist and use of compensatory strategies prn to improve participation in IADLs    Baseline Significant visuospatial deficits and difficulties w/ constructional apraxia    Time 8    Period Weeks    Status On-going              Plan - 04/10/21 4098     Clinical Impression Statement Pt demo'd significant difficulties w/ visual perceptual/visuospatial tasks this session, requiring max cueing (verbal, visual, gestural), scaffolding/breakdown of task, and extended processing time in all activities to facilitate completion. Pt also experienced difficulty w/ copying simple pegboard pattern (1 line of green pegs, 1 line of yellow pegs, and 1 line of red pegs), incorrectly orienting pegs despite provision of a model, verbal and gestural cues, and chaining;  increased success w/ 2nd trial compared w/ initial trial, which indicates incorporation of working memory.    OT Occupational Profile and History Detailed Assessment- Review of Records and additional review of physical, cognitive, psychosocial history related to current functional performance    Occupational performance deficits (Please refer to evaluation for details): ADL's;IADL's;Rest and Sleep;Work;Leisure;Social Participation    Body Structure /  Function / Physical Skills ADL;Balance;Body mechanics;Endurance;Coordination;Vision;Mobility;Decreased knowledge of precautions    Cognitive Skills Attention;Learn;Memory;Orientation;Perception;Problem Solve;Safety Awareness;Sequencing;Understand    Psychosocial Skills Coping Strategies;Environmental  Adaptations;Habits;Interpersonal Interaction;Routines and Behaviors    Rehab Potential Fair    Clinical Decision Making Several treatment options, min-mod task modification necessary    Comorbidities Affecting Occupational Performance: May have comorbidities impacting occupational performance    Modification or Assistance to Complete Evaluation  Max significant modification of tasks or assist is necessary to complete    OT Frequency 1x / week    OT Duration 8 weeks    OT Treatment/Interventions Self-care/ADL training;Balance training;Therapeutic activities;Psychosocial skills training;Therapeutic exercise;Cognitive remediation/compensation;Coping strategies training;Visual/perceptual remediation/compensation;Functional Mobility Training;Neuromuscular education;Energy conservation;Manual Therapy;Patient/family education    Plan Continue w/ visual perceptual activities (copying pattern w/ 1 or 2 colors; stacking pegs; coloring); simple sequencing    Consulted and Agree with Plan of Care Patient;Family member/caregiver    Family Member Consulted Brandon Nielsen (wife)             Patient will benefit from skilled therapeutic intervention in order to improve the  following deficits and impairments:   Body Structure / Function / Physical Skills: ADL, Balance, Body mechanics, Endurance, Coordination, Vision, Mobility, Decreased knowledge of precautions Cognitive Skills: Attention, Learn, Memory, Orientation, Perception, Problem Solve, Safety Awareness, Sequencing, Understand Psychosocial Skills: Coping Strategies, Environmental  Adaptations, Habits, Interpersonal Interaction, Routines and Behaviors   Visit Diagnosis: Visuospatial deficit  Frontal lobe and executive function deficit  Cognitive communication deficit  Other symptoms and signs involving the nervous system    Problem List Patient Active Problem List   Diagnosis Date Noted   Early onset Alzheimer's dementia without behavioral disturbance (HCC) 01/13/2020   Memory loss 11/18/2019   Routine health maintenance 11/08/2012   Obstructive sleep apnea 01/09/2008   GASTROESOPHAGEAL REFLUX DISEASE 01/09/2008     Rosie Fate, OTR/L, MSOT  04/10/2021, 9:05 AM  [facility] [address] [place], [place], [address] Phone: [phone]   Fax:  [phone]  Name: Quentavious Rittenhouse MRN: [id number] Date of Birth: [birth year]

## 2021-04-10 NOTE — Therapy (Signed)
[facility] [address] [place], [place], [address] Phone: [phone]   Fax:  [phone]  Speech Language Pathology Treatment  Patient Details  Name: Brandon Nielsen MRN: [id number] Date of Birth: 10-03-1956 Referring Provider (SLP): Van Clines, MD   Encounter Date: 04/10/2021   End of Session - 04/10/21 1533     Number of Visits 8    Date for SLP Re-Evaluation 07/05/21    SLP Start Time 0930    SLP Stop Time  1015    SLP Time Calculation (min) 45 min    Activity Tolerance Patient tolerated treatment well             Past Medical History:  Diagnosis Date   Chicken pox    as a child   Fuch's endothelial dystrophy    GERD (gastroesophageal reflux disease)    off meds - after loosing weight   H/O infectious mononucleosis    in college   Hepatitis     Past Surgical History:  Procedure Laterality Date   COLONOSCOPY     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   Subjective Assessment - 04/10/21 0931     Subjective Pt reported that he is starting to see some patterns in how things go together. I got stumped a few times, but I was beginning to see the patterns."    Currently in Pain? No/denies                   ADULT SLP TREATMENT - 04/10/21 1524       General Information   Behavior/Cognition Alert;Cooperative;Pleasant mood;Requires cueing      Treatment Provided   Treatment provided Cognitive-Linquistic      Cognitive-Linquistic Treatment   Treatment focused on Cognition    Skilled Treatment Evaluated pt w/ Communication Activities of Daily Living (CADL 3). Pt raw score re: 82; percentile 56%. Most impairment noted in "reading, writing, and using numbers" section. Assessment suggested auditory comprehension is more of a relative strength at this time vs. reading comprehension. SLP provided pt with a  safety card for his wallet and cards for the family for discreet use in public if wanted/needed.      Assessment / Recommendations / Plan   Plan Continue with current plan of care      Progression Toward Goals   Progression toward goals Progressing toward goals                SLP Short Term Goals - 04/10/21 1534       SLP SHORT TERM GOAL #1   Title Caregiver/patient will recall 3 memory strategies from list that they can begin to implement to increase independence and reduce anxiety.    Time 4    Period Weeks    Status On-going      SLP SHORT TERM GOAL #2   Title Pt will collaborate with SLP on current safety precautions being implemented at home and generate a list of other safety measures that may be beneficial in the future.    Time 4    Period Weeks    Status On-going      SLP SHORT TERM GOAL #3   Title Caregiver/patient will demonstrate use of communication strategies to assist in preventing/repairing communication breakdowns.    Time 4  Period Weeks    Status On-going              SLP Long Term Goals - 04/10/21 1534       SLP LONG TERM GOAL #1   Title Caregiver/patient will report use of external memory strategies to assist with maintenance and recall of information.    Time 8    Period Weeks    Status On-going      SLP LONG TERM GOAL #2   Title Caregiver will report carry over of increased safety measures at home.    Time 8    Period Weeks    Status On-going      SLP LONG TERM GOAL #3   Title Caregiver will report use of communication strategies in preventing/repairing communication breakdowns at home.    Time 8    Period Weeks    Status On-going              Plan - 04/10/21 1533     Clinical Impression Statement Pt completed full language assessment this date. See tx note for scores. SLP rec skilled speech services for cognitive-communication impairement to increase pt's QOL and independence level as well as increase caregiver  confidence.    Speech Therapy Frequency 2x / week    Duration 8 weeks    Treatment/Interventions Functional tasks;Patient/family education;Cueing hierarchy;Environmental controls;Cognitive reorganization;Multimodal communcation approach;Language facilitation;Compensatory techniques;Internal/external aids;SLP instruction and feedback    Potential to Achieve Goals Fair    Potential Considerations Ability to learn/carryover information    Consulted and Agree with Plan of Care Patient;Family member/caregiver    Family Member Consulted [name], Marliss Czar             Patient will benefit from skilled therapeutic intervention in order to improve the following deficits and impairments:   Cognitive communication deficit    Problem List Patient Active Problem List   Diagnosis Date Noted   Early onset Alzheimer's dementia without behavioral disturbance (HCC) 01/13/2020   Memory loss 11/18/2019   Routine health maintenance 11/08/2012   Obstructive sleep apnea 01/09/2008   GASTROESOPHAGEAL REFLUX DISEASE 01/09/2008    Brandon Gardener [place] MS, [place], CBIS  04/10/2021, 3:35 PM  [facility] [address] [place], [place], [address] Phone: [phone]   Fax:  [phone]   Name: Brandon Nielsen MRN: [id number] Date of Birth: 10/26/1955

## 2021-04-17 ENCOUNTER — Other Ambulatory Visit: Payer: Self-pay

## 2021-04-17 ENCOUNTER — Encounter: Payer: Self-pay | Admitting: Speech Pathology

## 2021-04-17 ENCOUNTER — Ambulatory Visit: Payer: Medicare Other | Attending: Neurology | Admitting: Occupational Therapy

## 2021-04-17 ENCOUNTER — Ambulatory Visit: Payer: Medicare Other | Admitting: Speech Pathology

## 2021-04-17 DIAGNOSIS — R41841 Cognitive communication deficit: Secondary | ICD-10-CM | POA: Insufficient documentation

## 2021-04-17 DIAGNOSIS — R41842 Visuospatial deficit: Secondary | ICD-10-CM | POA: Diagnosis not present

## 2021-04-17 DIAGNOSIS — R41844 Frontal lobe and executive function deficit: Secondary | ICD-10-CM | POA: Diagnosis present

## 2021-04-17 DIAGNOSIS — R29818 Other symptoms and signs involving the nervous system: Secondary | ICD-10-CM | POA: Diagnosis present

## 2021-04-17 NOTE — Therapy (Signed)
[facility] [address] [place], [place], [address] Phone: [phone]   Fax:  [phone]  Speech Language Pathology Treatment  Patient Details  Name: Brandon Nielsen MRN: [id number] Date of Birth: 1956/09/18 Referring Provider (SLP): Van Clines, MD   Encounter Date: 04/17/2021   End of Session - 04/17/21 1041     Visit Number 3    Number of Visits 8    Date for SLP Re-Evaluation 07/05/21    SLP Start Time 0935    SLP Stop Time  1025    SLP Time Calculation (min) 50 min    Activity Tolerance Patient tolerated treatment well             Past Medical History:  Diagnosis Date   Chicken pox    as a child   Fuch's endothelial dystrophy    GERD (gastroesophageal reflux disease)    off meds - after loosing weight   H/O infectious mononucleosis    in college   Hepatitis     Past Surgical History:  Procedure Laterality Date   COLONOSCOPY     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   Subjective Assessment - 04/17/21 0935     Subjective [place]    Currently in Pain? No/denies                   ADULT SLP TREATMENT - 04/17/21 1032       General Information   Behavior/Cognition Alert;Cooperative;Pleasant mood;Requires cueing      Treatment Provided   Treatment provided Cognitive-Linquistic      Cognitive-Linquistic Treatment   Treatment focused on Cognition    Skilled Treatment Edu on quality of life considerations. Pt reported work, physical health and social relationships were his most important QOL considerations at this time. SLP and pt generated a list of activities that he can participate in that he enjoys (i.e. going to office, golf, grandkids, politics, dog (charlie), eating (and cooking), being outside). Began to facilitate communication  strategy conversation - reported his goal was to "be more direct" and belives he would benefit from others': patience, breaking of information into smaller steps/chunks, and slowing down of speech rate. SLP, patient, and wife to sit down next session and discuss preventative and reparation strategies of communication breakdowns.      Assessment / Recommendations / Plan   Plan Continue with current plan of care      Progression Toward Goals   Progression toward goals Progressing toward goals                SLP Short Term Goals - 04/17/21 1043       SLP SHORT TERM GOAL #1   Title Caregiver/patient will recall 3 memory strategies from list that they can begin to implement to increase independence and reduce anxiety.    Time 3    Period Weeks    Status On-going      SLP SHORT TERM GOAL #2   Title Pt will collaborate with SLP on current safety precautions being implemented at home and generate a list of other safety measures that may be beneficial in the future.    Time 3    Period Weeks    Status On-going  SLP SHORT TERM GOAL #3   Title Caregiver/patient will demonstrate use of communication strategies to assist in preventing/repairing communication breakdowns.    Time 3    Period Weeks    Status On-going              SLP Long Term Goals - 04/17/21 1044       SLP LONG TERM GOAL #1   Title Caregiver/patient will report use of external memory strategies to assist with maintenance and recall of information.    Time 7    Period Weeks    Status On-going      SLP LONG TERM GOAL #2   Title Caregiver will report carry over of increased safety measures at home.    Time 7    Period Weeks    Status On-going      SLP LONG TERM GOAL #3   Title Caregiver will report use of communication strategies in preventing/repairing communication breakdowns at home.    Time 7    Period Weeks    Status On-going              Plan - 04/17/21 1042     Clinical Impression  Statement See tx note. Pt reportedly thought today's session was helpful. To discuss communication strategies next session. SLP rec skilled speech services for cognitive-communication impairement to increase pt's QOL and independence level as well as increase caregiver confidence.    Speech Therapy Frequency 2x / week    Duration 8 weeks    Treatment/Interventions Functional tasks;Patient/family education;Cueing hierarchy;Environmental controls;Cognitive reorganization;Multimodal communcation approach;Language facilitation;Compensatory techniques;Internal/external aids;SLP instruction and feedback    Potential to Achieve Goals Fair    Potential Considerations Ability to learn/carryover information    Consulted and Agree with Plan of Care Patient;Family member/caregiver    Family Member Consulted [name], Marliss Czar             Patient will benefit from skilled therapeutic intervention in order to improve the following deficits and impairments:   Cognitive communication deficit    Problem List Patient Active Problem List   Diagnosis Date Noted   Early onset Alzheimer's dementia without behavioral disturbance (HCC) 01/13/2020   Memory loss 11/18/2019   Routine health maintenance 11/08/2012   Obstructive sleep apnea 01/09/2008   GASTROESOPHAGEAL REFLUX DISEASE 01/09/2008    Michelene Gardener [place] MS, [place], CBIS  04/17/2021, 10:45 AM  [facility] [address] [place], [place], [address] Phone: [phone]   Fax:  [phone]   Name: Talmadge Ganas MRN: [id number] Date of Birth: [birth year]

## 2021-04-17 NOTE — Therapy (Signed)
[facility] [address] [place], [place], [address] Phone: [phone]   Fax:  [phone]  Occupational Therapy Treatment  Patient Details  Name: Brandon Nielsen MRN: [id number] Date of Birth: [birth year] Referring Provider (OT): Patrcia Dolly, MD (Neurology)   Encounter Date: 04/17/2021   OT End of Session - 04/17/21 0912     Visit Number 3    Number of Visits 9    Date for OT Re-Evaluation 07/03/21    Authorization Type Medicare Part A and B (primary); BCBS (secondary)    Authorization Time Period VL: [place]    Progress Note Due on Visit 10    OT Start Time 0908   pt arrived late   OT Stop Time 0935    OT Time Calculation (min) 27 min    Activity Tolerance Patient tolerated treatment well    Behavior During Therapy [facility] for tasks assessed/performed             Past Medical History:  Diagnosis Date   Chicken pox    as a child   Fuch's endothelial dystrophy    GERD (gastroesophageal reflux disease)    off meds - after loosing weight   H/O infectious mononucleosis    in college   Hepatitis     Past Surgical History:  Procedure Laterality Date   COLONOSCOPY     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   Subjective Assessment - 04/17/21 0910     Subjective  "You hit the nail on the head;" pt reports visual-perceptual deficits are very frustrating    Patient is accompanied by: --    Pertinent History Early onset Alzheimer's dementia    Limitations Cognitive and executive function deficits    Patient Stated Goals Improve processing and communication    Currently in Pain? No/denies             OT Education - 04/17/21 1357     Visual Scanning Scanning L to R w/ pt verbally identifying each icon to facilitate initial understanding of scanning activity; OT provided visual cue and attempted to grade activity up and down  by modifying cue w/ fair results  Pt then visually scanned for targets; OT incorporated line guide (high contrast), breakdown of task, and verbal cues to facilitate success. OT also included spaced retrieval technique to assist w/ recall of strategy for organized visual scanning pattern (L to R and top to bottom). Pt able to find 8/9 targets w/ Mod A.  Activity graded up to be completed w/out line guide w/ fair results; pt was able to find 7/9 targets w/ Mod A and demo'd decreased organization of scanning w/out line guide         OT Education - 04/17/21 1357     Education Details Education provided on visual perception/visuospatial deficits, impact on function, and goal of incorporating compensatory strategies    Person(s) Educated Patient    Methods Explanation    Comprehension Verbalized understanding             OT Short Term Goals - 04/10/21 0853       OT SHORT TERM GOAL #1   Title Pt and caregiver will be able to verbally identify at least 3 home/environmental modification strategies to facilitate safe in-home mobility    Baseline Increased risk for falls  Time 4    Period Weeks    Status On-going    Target Date 05/02/21      OT SHORT TERM GOAL #2   Title Pt will improve functional visual scanning by increasing success w/ target cancellation activities to at least 50% accuracy    Baseline 33% accuracy w/ number cancellation task    Time 4    Period Weeks    Status On-going      OT SHORT TERM GOAL #3   Title Pt will be able to complete simple alternating attention activity with Min A and at least 50% accuracy to improve participation in IADL tasks    Baseline Poor alternating attention; unable to complete Trail Making Test: Part A    Time 4    Period Weeks    Status On-going             OT Long Term Goals - 04/10/21 0856       OT LONG TERM GOAL #1   Title Pt and spouse will verbalize at least 3 compensatory strategies, including use of AE prn, to improve  safety and independence w/ BADLs    Baseline 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, constructional apraxia, and visual perception    Time 8    Period Weeks    Status On-going      OT LONG TERM GOAL #2   Title Patient will demonstrate appropriate visual scanning/awareness of objects during ADL/IADL task to maintain safety and awareness in functional living environment    Baseline Significant visual perceptual deficits    Time 8    Period Weeks    Status On-going      OT LONG TERM GOAL #3   Title Pt will be able to complete a simple IADL task w/ Min A (verbal cueing, extended time, compensatory strategies prn) for safety    Baseline Decreased participation in IADLs    Time 8    Period Weeks    Status On-going      OT LONG TERM GOAL #4   Title Pt will be able to complete simple constructional apraxia-based activity w/ assist and use of compensatory strategies prn to improve participation in IADLs    Baseline 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 To facilitate carryover of learned strategies, targets remained the same for repetition throughout session w/ OT grading activity up and down as appropriate. Pt did well w/ scanning using line guide, w/ decreasing organization observed w/ increasing amount of visual stimuli.    OT Occupational Profile and History Detailed Assessment- Review of Records and additional review of physical, cognitive, psychosocial history related to current functional performance    Occupational performance deficits (Please  refer to evaluation for details): ADL's;IADL's;Rest and Sleep;Work;Leisure;Social Participation    Body Structure / Function / Physical Skills ADL;Balance;Body mechanics;Endurance;Coordination;Vision;Mobility;Decreased knowledge of precautions    Cognitive Skills Attention;Learn;Memory;Orientation;Perception;Problem Solve;Safety Awareness;Sequencing;Understand    Psychosocial Skills Coping Strategies;Environmental  Adaptations;Habits;Interpersonal Interaction;Routines and Behaviors    Rehab Potential Fair    Clinical Decision Making Several treatment options, min-mod task  modification necessary    Comorbidities Affecting Occupational Performance: May have comorbidities impacting occupational performance    Modification or Assistance to Complete Evaluation  Max significant modification of tasks or assist is necessary to complete    OT Frequency 1x / week    OT Duration 8 weeks    OT Treatment/Interventions Self-care/ADL training;Balance training;Therapeutic activities;Psychosocial skills training;Therapeutic exercise;Cognitive remediation/compensation;Coping strategies training;Visual/perceptual remediation/compensation;Functional Mobility Training;Neuromuscular education;Energy conservation;Manual Therapy;Patient/family education    Plan Continue w/ visual perceptual activities (copying pattern w/ 1 or 2 colors; stacking pegs; coloring); simple sequencing    Consulted and Agree with Plan of Care Patient;Family member/caregiver    Family Member Consulted [name] (wife)             Patient will benefit from skilled therapeutic intervention in order to improve the following deficits and impairments:   Body Structure / Function / Physical Skills: ADL, Balance, Body mechanics, Endurance, Coordination, Vision, Mobility, Decreased knowledge of precautions Cognitive Skills: Attention, Learn, Memory, Orientation, Perception, Problem Solve, Safety Awareness, Sequencing, Understand Psychosocial Skills:  Coping Strategies, Environmental  Adaptations, Habits, Interpersonal Interaction, Routines and Behaviors   Visit Diagnosis: Visuospatial deficit  Frontal lobe and executive function deficit  Other symptoms and signs involving the nervous system    Problem List Patient Active Problem List   Diagnosis Date Noted   Early onset Alzheimer's dementia without behavioral disturbance (HCC) 01/13/2020   Memory loss 11/18/2019   Routine health maintenance 11/08/2012   Obstructive sleep apnea 01/09/2008   GASTROESOPHAGEAL REFLUX DISEASE 01/09/2008     Rosie Fate, OTR/L, MSOT  04/17/2021, 2:03 PM  [facility] [address] [place], [place], [address] Phone: [phone]   Fax:  [phone]  Name: Brandon Nielsen MRN: [id number] Date of Birth: [birth year]

## 2021-04-26 ENCOUNTER — Ambulatory Visit: Payer: Medicare Other | Admitting: Speech Pathology

## 2021-04-26 ENCOUNTER — Encounter: Payer: Self-pay | Admitting: Occupational Therapy

## 2021-04-26 ENCOUNTER — Ambulatory Visit: Payer: Medicare Other | Admitting: Occupational Therapy

## 2021-04-26 ENCOUNTER — Encounter: Payer: Medicare Other | Admitting: Occupational Therapy

## 2021-04-26 ENCOUNTER — Other Ambulatory Visit: Payer: Self-pay

## 2021-04-26 ENCOUNTER — Encounter: Payer: Self-pay | Admitting: Speech Pathology

## 2021-04-26 DIAGNOSIS — R41842 Visuospatial deficit: Secondary | ICD-10-CM | POA: Diagnosis not present

## 2021-04-26 DIAGNOSIS — R41844 Frontal lobe and executive function deficit: Secondary | ICD-10-CM

## 2021-04-26 DIAGNOSIS — R29818 Other symptoms and signs involving the nervous system: Secondary | ICD-10-CM

## 2021-04-26 DIAGNOSIS — R41841 Cognitive communication deficit: Secondary | ICD-10-CM

## 2021-04-27 NOTE — Therapy (Signed)
[facility] [address] [place], [place], [address] Phone: [phone]   Fax:  [phone]  Occupational Therapy Treatment  Patient Details  Name: Brandon Nielsen MRN: [id number] Date of Birth: Aug 18, 1956 Referring Provider (OT): Patrcia Dolly, MD (Neurology)   Encounter Date: 04/26/2021   OT End of Session - 04/26/21 1535     Visit Number 4    Number of Visits 9    Date for OT Re-Evaluation 07/03/21    Authorization Type Medicare Part A and B (primary); BCBS (secondary)    Authorization Time Period VL: [place]    Progress Note Due on Visit 10    OT Start Time 1533    OT Stop Time 1613    OT Time Calculation (min) 40 min    Activity Tolerance Patient tolerated treatment well    Behavior During Therapy WFL for tasks assessed/performed             Past Medical History:  Diagnosis Date   Chicken pox    as a child   Fuch's endothelial dystrophy    GERD (gastroesophageal reflux disease)    off meds - after loosing weight   H/O infectious mononucleosis    in college   Hepatitis     Past Surgical History:  Procedure Laterality Date   COLONOSCOPY     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   Subjective Assessment - 04/26/21 1535     Subjective  "I just feel like I can't see it." Pt's spouse also states that he has significant difficulty locating things in cabinets/on shelves    Pertinent History Early onset Alzheimer's dementia    Limitations Cognitive and executive function deficits    Patient Stated Goals Improve processing and communication    Currently in Pain? No/denies             OT Treatment/Exercises - 04/26/21    Pegboard Activity Easy-grip peg activity copying simple pattern onto small pegboard. Pt required Max A for success w/ OT providing breakdown of task, chaining, and verbal/visual cues to facilitate  success; OT also incorporated previously learned strategies involving L to R scanning and use of a line guide with good results    Tabletop Scanning Simple tabletop visual scanning activity w/ OT incorporated line guide (high contrast), breakdown of task, and verbal cues to facilitate success. Pt continued to experience difficulty w/ organized scanning w/out gestural cues from OT, finding all targets w/ Max A  During 2nd trial, task was graded up to be completed w/out line guide; pt was able to find all targets, but demo'd decreased organization of scanning w/out line guide, requiring verbal and gestural cues for success    Environmental Scanning Environmental scanning activity in therapy gym w/ pt identifying 9/10 targets w/ Min A and contextual cues. Pt demo'd increased difficulty w/ location of targets in dynamic, complex areas         OT Education - 04/26/21 1616     Education Details Continued condition-specific education; discussed visual perceptual hierarchy    Person(s) Educated Patient    Methods Explanation    Comprehension Verbalized understanding             OT Short Term Goals - 04/10/21 0853       OT SHORT TERM GOAL #1   Title  Pt and caregiver will be able to verbally identify at least 3 home/environmental modification strategies to facilitate safe in-home mobility    Baseline Increased risk for falls    Time 4    Period Weeks    Status On-going    Target Date 05/02/21      OT SHORT TERM GOAL #2   Title Pt will improve functional visual scanning by increasing success w/ target cancellation activities to at least 50% accuracy    Baseline 33% accuracy w/ number cancellation task    Time 4    Period Weeks    Status On-going      OT SHORT TERM GOAL #3   Title Pt will be able to complete simple alternating attention activity with Min A and at least 50% accuracy to improve participation in IADL tasks    Baseline Poor alternating attention; unable to complete Trail  Making Test: Part A    Time 4    Period Weeks    Status On-going             OT Long Term Goals - 04/10/21 0856       OT LONG TERM GOAL #1   Title Pt and spouse will verbalize at least 3 compensatory strategies, including use of AE prn, to improve safety and independence w/ BADLs    Baseline 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, constructional apraxia, and visual perception    Time 8    Period Weeks    Status On-going      OT LONG TERM GOAL #2   Title Patient will demonstrate appropriate visual scanning/awareness of objects during ADL/IADL task to maintain safety and awareness in functional living environment    Baseline Significant visual perceptual deficits    Time 8    Period Weeks    Status On-going      OT LONG TERM GOAL #3   Title Pt will be able to complete a simple IADL task w/ Min A (verbal cueing, extended time, compensatory strategies prn) for safety    Baseline Decreased participation in IADLs    Time 8    Period Weeks    Status On-going      OT LONG TERM GOAL #4   Title Pt will be able to complete simple constructional apraxia-based activity w/ assist and use of compensatory strategies prn to improve participation in IADLs    Baseline Significant visuospatial deficits and difficulties w/ constructional apraxia    Time 8    Period Weeks    Status On-going             Plan - 04/26/21 1536     Clinical Impression Statement OT continued to facilitate practice of strategies for visual scanning and visual perceptual/spatial deficits this session w/ pt demo'ing some improvement w/ tabletop visual scanning activity compared w/ previous session. When attempting environmental scanning, pt experienced significantly more difficulty w/ dynamic and complex areas of the environment compared w/ simple, static areas.    OT Occupational Profile and History Detailed Assessment- Review of Records and additional review of physical, cognitive, psychosocial  history related to current functional performance    Occupational performance deficits (Please refer to evaluation for details): ADL's;IADL's;Rest and Sleep;Work;Leisure;Social Participation    Body Structure / Function / Physical Skills ADL;Balance;Body mechanics;Endurance;Coordination;Vision;Mobility;Decreased knowledge of precautions    Cognitive Skills Attention;Learn;Memory;Orientation;Perception;Problem Solve;Safety Awareness;Sequencing;Understand    Psychosocial Skills Coping Strategies;Environmental  Adaptations;Habits;Interpersonal Interaction;Routines and Behaviors    Rehab Potential Fair    Clinical Decision  Making Several treatment options, min-mod task modification necessary    Comorbidities Affecting Occupational Performance: May have comorbidities impacting occupational performance    Modification or Assistance to Complete Evaluation  Max significant modification of tasks or assist is necessary to complete    OT Frequency 1x / week    OT Duration 8 weeks    OT Treatment/Interventions Self-care/ADL training;Balance training;Therapeutic activities;Psychosocial skills training;Therapeutic exercise;Cognitive remediation/compensation;Coping strategies training;Visual/perceptual remediation/compensation;Functional Mobility Training;Neuromuscular education;Energy conservation;Manual Therapy;Patient/family education    Plan Continue w/ visual perceptual activities (finding an object in cabinet, pegboard, coloring); simple sequencing    Consulted and Agree with Plan of Care Patient;Family member/caregiver    Family Member Consulted [name] (wife)             Patient will benefit from skilled therapeutic intervention in order to improve the following deficits and impairments:   Body Structure / Function / Physical Skills: ADL, Balance, Body mechanics, Endurance, Coordination, Vision, Mobility, Decreased knowledge of precautions Cognitive Skills: Attention, Learn, Memory, Orientation,  Perception, Problem Solve, Safety Awareness, Sequencing, Understand Psychosocial Skills: Coping Strategies, Environmental  Adaptations, Habits, Interpersonal Interaction, Routines and Behaviors   Visit Diagnosis: Visuospatial deficit  Frontal lobe and executive function deficit  Other symptoms and signs involving the nervous system    Problem List Patient Active Problem List   Diagnosis Date Noted   Early onset Alzheimer's dementia without behavioral disturbance (HCC) 01/13/2020   Memory loss 11/18/2019   Routine health maintenance 11/08/2012   Obstructive sleep apnea 01/09/2008   GASTROESOPHAGEAL REFLUX DISEASE 01/09/2008     Rosie Fate, OTR/L, MSOT  04/27/2021, 10:46 AM  [facility] [address] [place], [place], [address] Phone: [phone]   Fax:  [phone]  Name: Brandon Nielsen MRN: [id number] Date of Birth: [birth year]

## 2021-04-27 NOTE — Therapy (Signed)
[facility] [address] [place], [place], [address] Phone: [phone]   Fax:  [phone]  Speech Language Pathology Treatment  Patient Details  Name: Brandon Nielsen MRN: [id number] Date of Birth: June 22, 1956 Referring Provider (SLP): Van Clines, MD   Encounter Date: 04/26/2021   End of Session - 04/27/21 1105     Visit Number 4    Number of Visits 8    Date for SLP Re-Evaluation 07/05/21    SLP Start Time 1615    SLP Stop Time  1700    SLP Time Calculation (min) 45 min    Activity Tolerance Patient tolerated treatment well             Past Medical History:  Diagnosis Date   Chicken pox    as a child   Fuch's endothelial dystrophy    GERD (gastroesophageal reflux disease)    off meds - after loosing weight   H/O infectious mononucleosis    in college   Hepatitis     Past Surgical History:  Procedure Laterality Date   COLONOSCOPY     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          ADULT SLP TREATMENT - 04/27/21 1102       General Information   Behavior/Cognition Alert;Cooperative;Pleasant mood;Requires cueing      Treatment Provided   Treatment provided Cognitive-Linquistic      Cognitive-Linquistic Treatment   Treatment focused on Patient/family/caregiver education    Skilled Treatment Provided edu re: communication strategies to prevent and repair communicative breakdowns at home. Spoke with wife about the importance of continuing routines to decrease patient frustration/anxiety. Pt and wife were able to discuss how they could better use strategies at home and provided examples of communication strategies in use currently.Pt reported he would most benefit from patience from other's and extra time for processing.      Assessment / Recommendations / Plan   Plan Continue with current plan of care       Progression Toward Goals   Progression toward goals Progressing toward goals              SLP Education - 04/27/21 1105     Education Details Communication strategies.    Person(s) Educated Patient;Spouse    Methods Explanation;Demonstration;Handout    Comprehension Verbalized understanding              SLP Short Term Goals - 04/27/21 1106       SLP SHORT TERM GOAL #1   Title Caregiver/patient will recall 3 memory strategies from list that they can begin to implement to increase independence and reduce anxiety.    Time 3    Period Weeks    Status On-going      SLP SHORT TERM GOAL #2   Title Pt will collaborate with SLP on current safety precautions being implemented at home and generate a list of other safety measures that may be beneficial in the future.    Time 3    Period Weeks    Status On-going      SLP SHORT TERM GOAL #3   Title Caregiver/patient will demonstrate use of communication strategies to assist in preventing/repairing communication breakdowns.    Time 3    Period Weeks    Status On-going  SLP Long Term Goals - 04/27/21 1106       SLP LONG TERM GOAL #1   Title Caregiver/patient will report use of external memory strategies to assist with maintenance and recall of information.    Time 7    Period Weeks    Status On-going      SLP LONG TERM GOAL #2   Title Caregiver will report carry over of increased safety measures at home.    Time 7    Period Weeks    Status On-going      SLP LONG TERM GOAL #3   Title Caregiver will report use of communication strategies in preventing/repairing communication breakdowns at home.    Time 7    Period Weeks    Status On-going              Plan - 04/27/21 1105     Clinical Impression Statement Positive reaction from both patient and wife regarding communication strategies. To discuss external memory strategy use nxt session. SLP rec skilled speech services for  cognitive-communication impairement to increase pt's QOL and independence level as well as increase caregiver confidence.    Speech Therapy Frequency 2x / week    Duration 8 weeks    Treatment/Interventions Functional tasks;Patient/family education;Cueing hierarchy;Environmental controls;Cognitive reorganization;Multimodal communcation approach;Language facilitation;Compensatory techniques;Internal/external aids;SLP instruction and feedback    Potential to Achieve Goals Fair    Potential Considerations Ability to learn/carryover information    Consulted and Agree with Plan of Care Patient;Family member/caregiver    Family Member Consulted Brandon Nielsen             Patient will benefit from skilled therapeutic intervention in order to improve the following deficits and impairments:   Cognitive communication deficit    Problem List Patient Active Problem List   Diagnosis Date Noted   Early onset Alzheimer's dementia without behavioral disturbance (HCC) 01/13/2020   Memory loss 11/18/2019   Routine health maintenance 11/08/2012   Obstructive sleep apnea 01/09/2008   GASTROESOPHAGEAL REFLUX DISEASE 01/09/2008    Michelene Gardener [place] MS, [place], CBIS  04/27/2021, 11:08 AM  [facility] [address] [place], [place], [address] Phone: [phone]   Fax:  [phone]   Name: Brandon Nielsen MRN: [id number] Date of Birth: 19-May-1956

## 2021-05-01 ENCOUNTER — Encounter: Payer: Self-pay | Admitting: Speech Pathology

## 2021-05-01 ENCOUNTER — Ambulatory Visit: Payer: Medicare Other | Admitting: Speech Pathology

## 2021-05-01 ENCOUNTER — Ambulatory Visit: Payer: Medicare Other | Admitting: Occupational Therapy

## 2021-05-01 ENCOUNTER — Other Ambulatory Visit: Payer: Self-pay

## 2021-05-01 DIAGNOSIS — R41841 Cognitive communication deficit: Secondary | ICD-10-CM

## 2021-05-01 DIAGNOSIS — R41842 Visuospatial deficit: Secondary | ICD-10-CM | POA: Diagnosis not present

## 2021-05-01 DIAGNOSIS — R41844 Frontal lobe and executive function deficit: Secondary | ICD-10-CM

## 2021-05-01 DIAGNOSIS — R29818 Other symptoms and signs involving the nervous system: Secondary | ICD-10-CM

## 2021-05-01 NOTE — Therapy (Signed)
[facility] [address] [place], [place], [address] Phone: [phone]   Fax:  [phone]  Speech Language Pathology Treatment  Patient Details  Name: Brandon Nielsen MRN: [id number] Date of Birth: 09-Jan-1956 Referring Provider (SLP): Van Clines, MD   Encounter Date: 05/01/2021   End of Session - 05/01/21 1054     Visit Number 5    Number of Visits 8    Date for SLP Re-Evaluation 07/05/21    SLP Start Time 1015    SLP Stop Time  1100    SLP Time Calculation (min) 45 min    Activity Tolerance Patient tolerated treatment well             Past Medical History:  Diagnosis Date   Chicken pox    as a child   Fuch's endothelial dystrophy    GERD (gastroesophageal reflux disease)    off meds - after loosing weight   H/O infectious mononucleosis    in college   Hepatitis     Past Surgical History:  Procedure Laterality Date   COLONOSCOPY     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   Subjective Assessment - 05/01/21 1019     Subjective Pt reported he had a good weekend. Pt reported he is to "take a break" from therapy due to all of the extraneous variables associated with moving for a month.    Currently in Pain? No/denies                   ADULT SLP TREATMENT - 05/01/21 1051       General Information   Behavior/Cognition Alert;Cooperative;Pleasant mood;Requires cueing      Treatment Provided   Treatment provided Cognitive-Linquistic      Cognitive-Linquistic Treatment   Treatment focused on Cognition    Skilled Treatment Spaced retrieval for calendar use on iphone. Brandon Nielsen was unsure why his appointments were not showing on his phone. SLP cued patient to find the "pink H for Dak" and click to determine that "Calendar" is selected with a check mark.  Brandon Nielsen was able to recall with minA verbal cueing  post 2 min.      Assessment / Recommendations / Plan   Plan Continue with current plan of care      Progression Toward Goals   Progression toward goals Progressing toward goals                SLP Short Term Goals - 05/01/21 1255       SLP SHORT TERM GOAL #1   Title Caregiver/patient will recall 3 memory strategies from list that they can begin to implement to increase independence and reduce anxiety.    Time 2    Period Weeks    Status On-going      SLP SHORT TERM GOAL #2   Title Pt will collaborate with SLP on current safety precautions being implemented at home and generate a list of other safety measures that may be beneficial in the future.    Time 2    Period Weeks    Status On-going      SLP SHORT TERM GOAL #3   Title Caregiver/patient will demonstrate use of communication strategies to assist in preventing/repairing communication breakdowns.    Time 2    Period Weeks  Status On-going              SLP Long Term Goals - 05/01/21 1255       SLP LONG TERM GOAL #1   Title Caregiver/patient will report use of external memory strategies to assist with maintenance and recall of information.    Time 6    Period Weeks    Status On-going      SLP LONG TERM GOAL #2   Title Caregiver will report carry over of increased safety measures at home.    Time 6    Period Weeks    Status On-going      SLP LONG TERM GOAL #3   Title Caregiver will report use of communication strategies in preventing/repairing communication breakdowns at home.    Time 6    Period Weeks    Status On-going              Plan - 05/01/21 1253     Clinical Impression Statement Wife was unable to be here for family education. Pt reports he has enjoyed therapy, but will be taking a break. SLP continues to rec skilled speech services for cognitive-communication impairement to increase pt's QOL and independence level.    Speech Therapy Frequency 2x / week    Duration 8 weeks     Treatment/Interventions Functional tasks;Patient/family education;Cueing hierarchy;Environmental controls;Cognitive reorganization;Multimodal communcation approach;Language facilitation;Compensatory techniques;Internal/external aids;SLP instruction and feedback    Potential to Achieve Goals Fair    Potential Considerations Ability to learn/carryover information    Consulted and Agree with Plan of Care Patient;Family member/caregiver    Family Member Consulted Brandon Nielsen             Patient will benefit from skilled therapeutic intervention in order to improve the following deficits and impairments:   Cognitive communication deficit    Problem List Patient Active Problem List   Diagnosis Date Noted   Early onset Alzheimer's dementia without behavioral disturbance (HCC) 01/13/2020   Memory loss 11/18/2019   Routine health maintenance 11/08/2012   Obstructive sleep apnea 01/09/2008   GASTROESOPHAGEAL REFLUX DISEASE 01/09/2008    Brandon Nielsen [place] MS, [place], CBIS  05/01/2021, 12:56 PM  [facility] [address] [place], [place], [address] Phone: [phone]   Fax:  [phone]   Name: Brandon Nielsen MRN: [id number] Date of Birth: Jan 11, 1956

## 2021-05-01 NOTE — Therapy (Addendum)
[place] [place], [place], [address] Phone: [phone]   Fax:  [phone]  Occupational Therapy Treatment  Patient Details  Name: Brandon Nielsen MRN: [id number] Date of Birth: [birth year] Referring Provider (OT): Ellouise Newer, MD (Neurology)   Encounter Date: 05/01/2021   OT End of Session - 05/01/21 0942     Visit Number 5    Number of Visits 9    Date for OT Re-Evaluation 07/03/21    Authorization Type Medicare Part A and B (primary); BCBS (secondary)    Authorization Time Period VL: [place]    Progress Note Due on Visit 10    OT Start Time [phone]    OT Stop Time 1015    OT Time Calculation (min) 41 min    Activity Tolerance Patient tolerated treatment well    Behavior During Therapy [facility] for tasks assessed/performed            Past Medical History:  Diagnosis Date   Chicken pox    as a child   Fuch's endothelial dystrophy    GERD (gastroesophageal reflux disease)    off meds - after loosing weight   H/O infectious mononucleosis    in college   Hepatitis     Past Surgical History:  Procedure Laterality Date   COLONOSCOPY     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   Subjective Assessment - 05/01/21 0936     Subjective  Pt reports feeling like therapy is not causing a significant improvement, but states he is having fun and "wants to see where it goes"    Pertinent History Early onset Alzheimer's dementia    Limitations Cognitive and executive function deficits    Patient Stated Goals Improve processing and communication    Currently in Pain? No/denies             OT Treatment/Exercises - 05/01/21    Environmental Scanning Visual scanning activity involving pt locating objects in 3-tier cabinets. Pt able to find foreground objects w/out difficulty and required extended time for processing,  verbal/gestural cueing, and breakdown of compensatory strategies to find background or partially covered objects indicating potential limitations w/ visual closure    Trail Making Trail-making activity, finding and erasing numbers in sequential order completed on window to facilitate visual, depth, and figure-ground perception. Pt able to complete activity w/ 67% accuracy, requiring min-mod cueing to identify 3/15 targets. Pt demo'd significant difficulty w/ 2 vs. 12, 3 vs. 13, 4 vs. 14, etc. which did improve w/ repetition  2nd attempt completed alternating between numbers and letters. After initial modeling from OT, pt able to find 6/9 targets w/ extended time for processing, requiring visual/gestural and verbal cues to identify 3/9 targets     Symbol Cancellation Activity Familiar tabletop symbol cancellation activity completed w/ pt able to identify 9/9 targets independently, requiring extended time for processing w/ 2/9             OT Education - 05/01/21 1520     Education Details Continued visual-perception compensatory strategies and condition-specific education    Person(s) Educated Patient    Methods Explanation    Comprehension Verbalized understanding             OT Short Term Goals - 05/01/21 1007       OT SHORT  TERM GOAL #1   Title Pt and caregiver will be able to verbally identify at least 3 home/environmental modification strategies to facilitate safe in-home mobility    Baseline Increased risk for falls    Time 4    Period Weeks    Status On-going    Target Date 05/02/21      OT SHORT TERM GOAL #2   Title Pt will improve functional visual scanning by increasing success w/ target cancellation activities to at least 50% accuracy    Baseline 33% accuracy w/ number cancellation task    Time 4    Period Weeks    Status Achieved   05/01/21 - 100% accuracy w/ familiar target cancellation activity     OT SHORT TERM GOAL #3   Title Pt will be able to complete simple  alternating attention activity with Min A and at least 50% accuracy to improve participation in IADL tasks    Baseline Poor alternating attention; unable to complete Trail Making Test: Part A    Time 4    Period Weeks    Status On-going             OT Long Term Goals - 04/10/21 0856       OT LONG TERM GOAL #1   Title Pt and spouse will verbalize at least 3 compensatory strategies, including use of AE prn, to improve safety and independence w/ BADLs    Baseline 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, constructional apraxia, and visual perception    Time 8    Period Weeks    Status On-going      OT LONG TERM GOAL #2   Title Patient will demonstrate appropriate visual scanning/awareness of objects during ADL/IADL task to maintain safety and awareness in functional living environment    Baseline Significant visual perceptual deficits    Time 8    Period Weeks    Status On-going      OT LONG TERM GOAL #3   Title Pt will be able to complete a simple IADL task w/ Min A (verbal cueing, extended time, compensatory strategies prn) for safety    Baseline Decreased participation in IADLs    Time 8    Period Weeks    Status On-going      OT LONG TERM GOAL #4   Title Pt will be able to complete simple constructional apraxia-based activity w/ assist and use of compensatory strategies prn to improve participation in IADLs    Baseline Significant visuospatial deficits and difficulties w/ constructional apraxia    Time 8    Period Weeks    Status On-going             Plan - 05/01/21 0943     Clinical Impression Statement Pt is progressing well toward goals and demo'd improvements in visual scanning/visual perception this session. Pt was able to complete tabletop symbol cancellation activity w/ extended time but w/out assistance, number cancellation activity incorporating figure-ground element w/ 80% accuracy, and trail-making activity w/ 67% accuracy. OT reviewed visual  perceptual compensatory strategies and emphasized importance of practicing strategies at home as able.    OT Occupational Profile and History Detailed Assessment- Review of Records and additional review of physical, cognitive, psychosocial history related to current functional performance    Occupational performance deficits (Please refer to evaluation for details): ADL's;IADL's;Rest and Sleep;Work;Leisure;Social Participation    Body Structure / Function / Physical Skills ADL;Balance;Body mechanics;Endurance;Coordination;Vision;Mobility;Decreased knowledge of precautions    Cognitive Skills Attention;Learn;Memory;Orientation;Perception;Problem  Solve;Safety Awareness;Sequencing;Understand    Psychosocial Skills Coping Strategies;Environmental  Adaptations;Habits;Interpersonal Interaction;Routines and Behaviors    Rehab Potential Fair    Clinical Decision Making Several treatment options, min-mod task modification necessary    Comorbidities Affecting Occupational Performance: May have comorbidities impacting occupational performance    Modification or Assistance to Complete Evaluation  Max significant modification of tasks or assist is necessary to complete    OT Frequency 1x / week    OT Duration 8 weeks    OT Treatment/Interventions Self-care/ADL training;Balance training;Therapeutic activities;Psychosocial skills training;Therapeutic exercise;Cognitive remediation/compensation;Coping strategies training;Visual/perceptual remediation/compensation;Functional Mobility Training;Neuromuscular education;Energy conservation;Manual Therapy;Patient/family education    Plan Continue w/ visual perceptual activities (color by number for alternating attention); simple sequencing of task    Consulted and Agree with Plan of Care Patient;Family member/caregiver    Family Member Consulted Izora Gala (wife)            Patient will benefit from skilled therapeutic intervention in order to improve the following  deficits and impairments:   Body Structure / Function / Physical Skills: ADL, Balance, Body mechanics, Endurance, Coordination, Vision, Mobility, Decreased knowledge of precautions Cognitive Skills: Attention, Learn, Memory, Orientation, Perception, Problem Solve, Safety Awareness, Sequencing, Understand Psychosocial Skills: Coping Strategies, Environmental  Adaptations, Habits, Interpersonal Interaction, Routines and Behaviors   Visit Diagnosis: Visuospatial deficit  Frontal lobe and executive function deficit  Other symptoms and signs involving the nervous system   Problem List Patient Active Problem List   Diagnosis Date Noted   Early onset Alzheimer's dementia without behavioral disturbance ([place]) 01/13/2020   Memory loss 11/18/2019   Routine health maintenance 11/08/2012   Obstructive sleep apnea 01/09/2008   GASTROESOPHAGEAL REFLUX DISEASE 01/09/2008    Kathrine Cords, OTR/L, MSOT  05/01/2021, 3:24 PM  OCCUPATIONAL THERAPY DISCHARGE SUMMARY  Visits from Start of Care: 5  Current functional level related to goals / functional outcomes: Unable to comment/assess as patient has not been seen recently due to pt not returning since last visit. Pt had met 1/3 STGs and 0/4 LTGs at time of last visit.   Remaining deficits: As of last visit on 05/01/21 - cognition and executive functioning (alternating attention, memory, safety awareness, problem-solving); decreased coordination; safety and independence w/ ADLs   Education / Equipment: Condition-specific education, particularly regarding visual and perceptual deficits; compensatory strategies  Pt goals were not met as they were unable to be assessed due to unexpected d/c. Patient is being discharged due to not returning since the last visit.  Kathrine Cords, OTR/L, MSOT  09/25/2021, 9:44 AM  [place]. [place], [place], [address] Phone: [phone]   Fax:   [phone]  Name: Boen Sterbenz MRN: [id number] Date of Birth: 1956/05/02

## 2021-05-16 ENCOUNTER — Other Ambulatory Visit: Payer: Self-pay | Admitting: Internal Medicine

## 2021-06-11 ENCOUNTER — Other Ambulatory Visit: Payer: Self-pay | Admitting: Internal Medicine

## 2021-07-12 IMAGING — MR MR HEAD W/O CM
10 series · 48 of 48 positions shown · non-contrast
Comparison: No pertinent prior studies available for comparison.

CLINICAL DATA: Memory loss. Additional history provided: Memory
loss for 1.5 years.

EXAM:
MRI HEAD WITHOUT CONTRAST
TECHNIQUE: Multiplanar, multiecho pulse sequences of the brain and surrounding
structures were obtained without intravenous contrast.

[Series 2: T1 · sagittal · 5.0mm · 0.45mm/px · 3 of 21 slices shown]
[im 1/21]
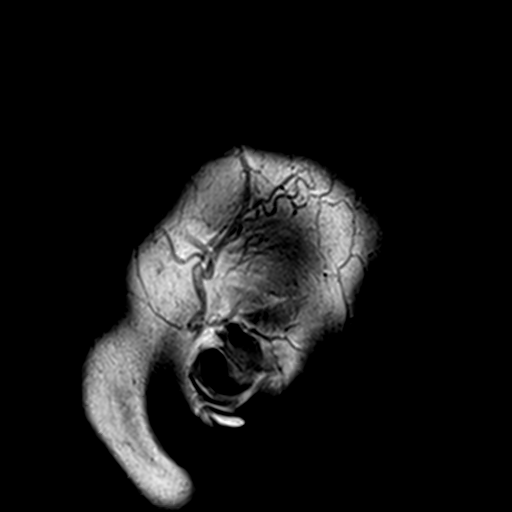
[im 11/21]
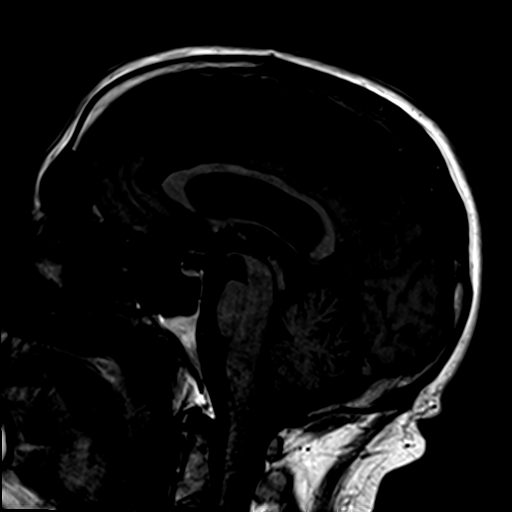
[im 21/21]
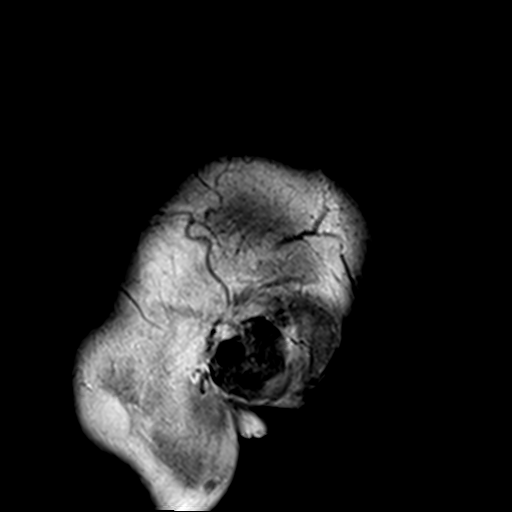

[Series 3: DWI · axial · 3.0mm · 1.80mm/px · z∈[-33,+118]mm · 10 of 108 slices shown (1 of 4)]
[im 1/108]
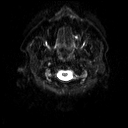
[im 12/108]
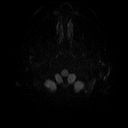
[im 24/108]
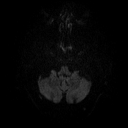
[im 36/108]
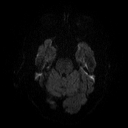
[im 48/108]
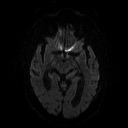
[im 60/108]
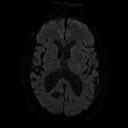
[im 72/108]
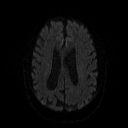
[im 84/108]
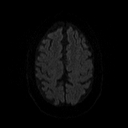
[im 96/108]
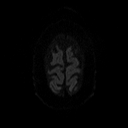
[im 108/108]
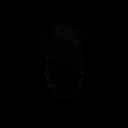

[Series 4: DWI · axial · 3.0mm · 1.80mm/px · z∈[-33,+118]mm · 4 of 52 slices shown (2 of 4)]
[im 1/52]
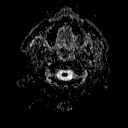
[im 18/52]
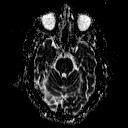
[im 35/52]
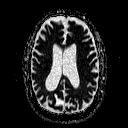
[im 52/52]
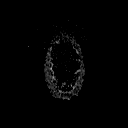

[Series 5: DWI · coronal · 5.0mm · 1.80mm/px · 6 of 76 slices shown (3 of 4)]
[im 1/76]
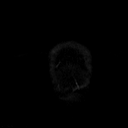
[im 16/76]
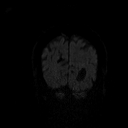
[im 31/76]
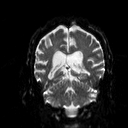
[im 46/76]
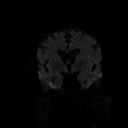
[im 61/76]
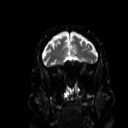
[im 76/76]
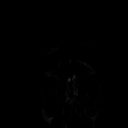

[Series 6: DWI · coronal · 5.0mm · 1.80mm/px · 3 of 38 slices shown (4 of 4)]
[im 1/38]
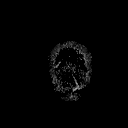
[im 19/38]
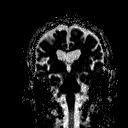
[im 38/38]
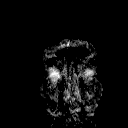

[Series 7: T2 · axial · 5.0mm · 0.51mm/px · z∈[-34,+119]mm · 2 of 24 slices shown (1 of 2)]
[im 1/24]
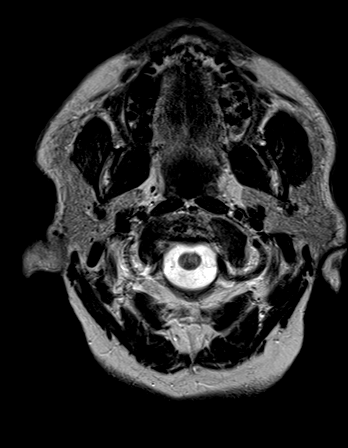
[im 24/24]
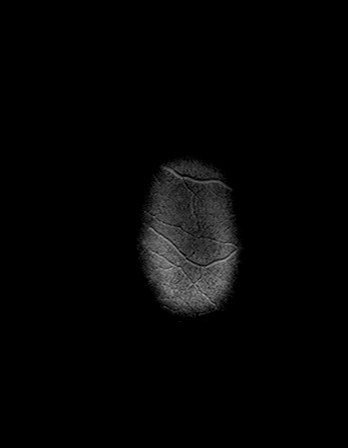

[Series 8: FLAIR · axial · 3.0mm · 0.45mm/px · z∈[-19,+130]mm · 3 of 35 slices shown]
[im 1/35]
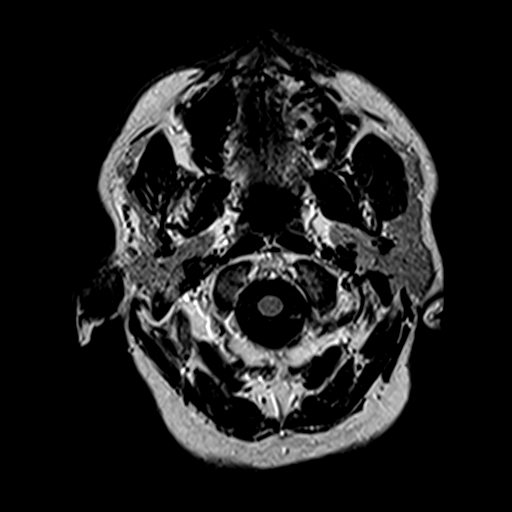
[im 18/35]
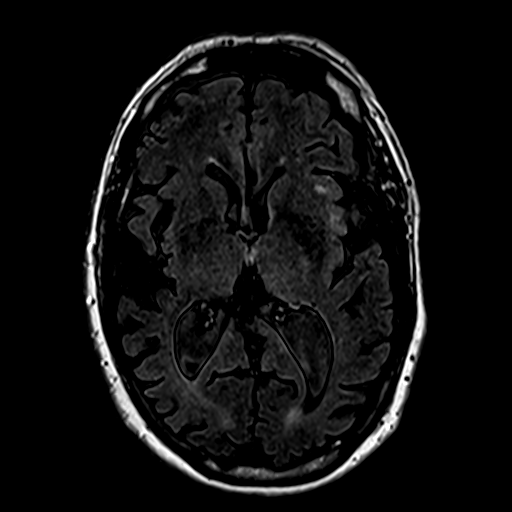
[im 35/35]
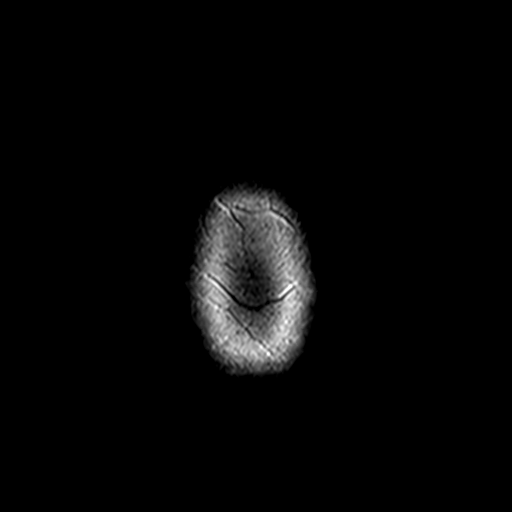

[Series 10: swi_images · axial · 4.1mm · 0.90mm/px · z∈[-38,+115]mm · 3 of 40 slices shown]
[im 1/40]
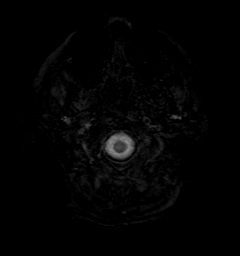
[im 20/40]
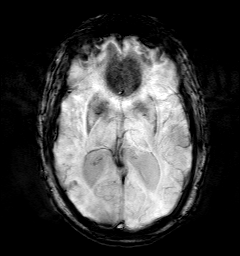
[im 40/40]
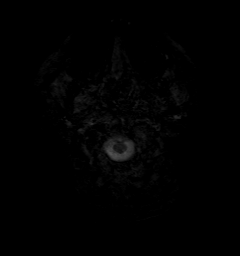

[Series 11: t1_mpr_tra · axial · 1.1mm · 0.71mm/px · z∈[-34,+116]mm · 12 of 144 slices shown]
[im 1/144]
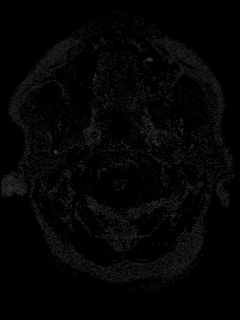
[im 14/144]
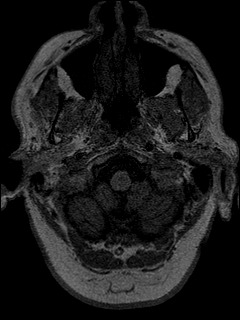
[im 27/144]
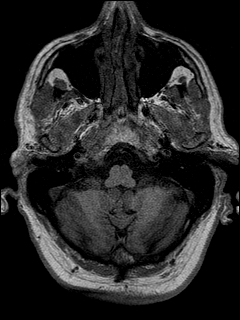
[im 40/144]
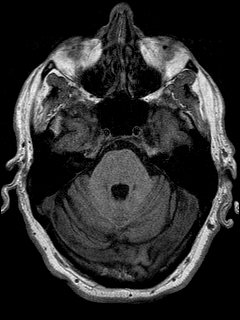
[im 53/144]
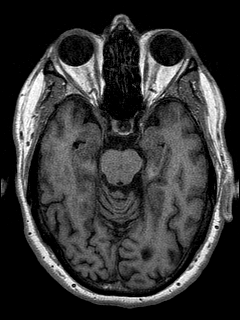
[im 66/144]
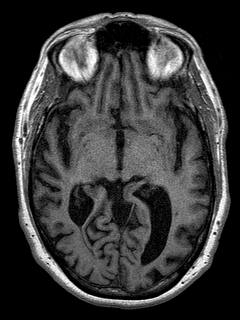
[im 79/144]
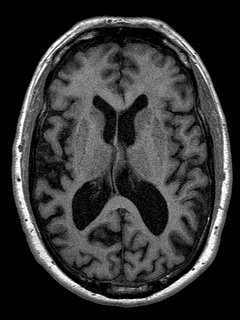
[im 92/144]
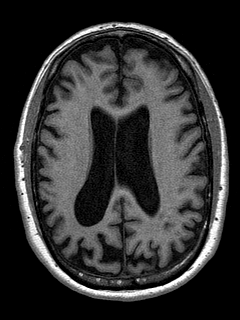
[im 105/144]
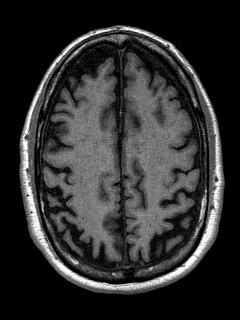
[im 118/144]
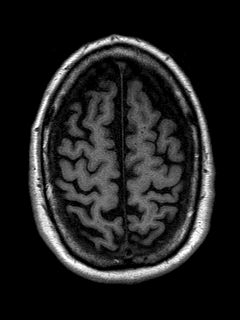
[im 131/144]
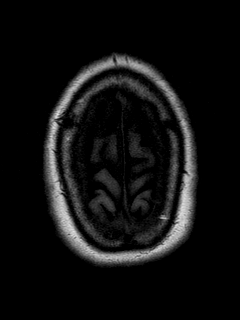
[im 144/144]
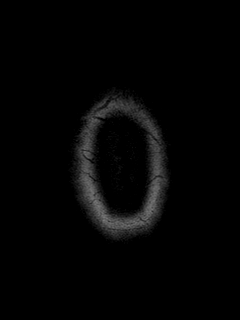

[Series 12: T2 · coronal · 5.0mm · 0.45mm/px · 2 of 29 slices shown (2 of 2)]
[im 1/29]
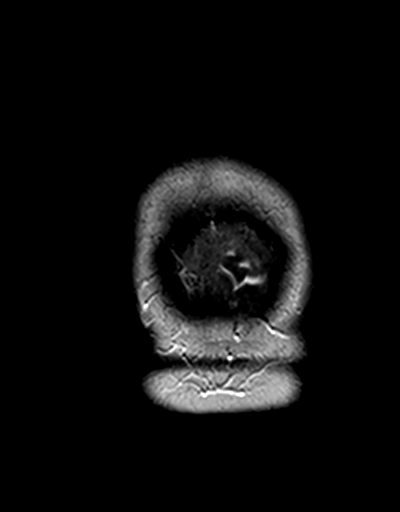
[im 29/29]
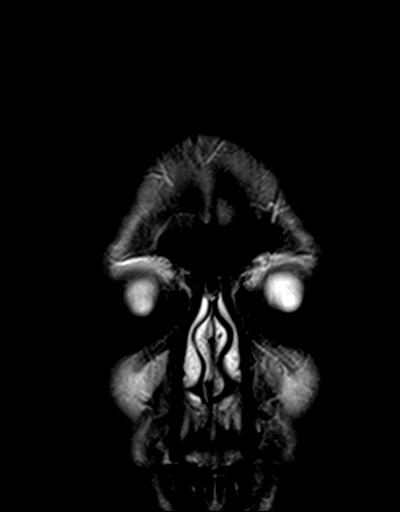

[48 of 48 positions shown; findings below may reference images not displayed]

FINDINGS: Brain:

Mild intermittent motion degradation.

There is no evidence of acute infarct.

No evidence of intracranial mass.

No midline shift or extra-axial fluid collection.

Mild scattered T2/FLAIR hyperintensity within the cerebral white
matter is nonspecific, but consistent with chronic small vessel
ischemic disease.

There are several small foci of SWI signal loss within the right
greater than left posterior cerebral hemispheres consistent with
chronic microhemorrhages.

Moderate generalized cerebral atrophy without definite lobar
predominance.

Vascular: Flow voids maintained within the proximal large arterial
vessels.

Skull and upper cervical spine: No focal marrow lesion.

Sinuses/Orbits: Visualized orbits demonstrate no acute abnormality.
Mild ethmoid sinus mucosal thickening. No significant mastoid
effusion.
IMPRESSION: 1. Mildly motion degraded examination.
2. Moderate generalized cerebral atrophy without definite lobar
predominance.
3. Mild chronic small vessel ischemic disease.
4. There are several chronic microhemorrhages within the posterior
cerebral hemispheres, greater on the right.

## 2021-08-15 ENCOUNTER — Telehealth: Payer: Self-pay | Admitting: Internal Medicine

## 2021-08-15 ENCOUNTER — Other Ambulatory Visit: Payer: Self-pay | Admitting: Internal Medicine

## 2021-08-15 NOTE — Telephone Encounter (Signed)
Pt scheduled an appointment on 11.14.22, he leaves for [facility] on Friday and wants to know if he can get his script for   Ipratropium Bromide   Until that time  If unable to do so please call him at [phone]

## 2021-08-15 NOTE — Telephone Encounter (Signed)
See below

## 2021-08-16 MED ORDER — IPRATROPIUM BROMIDE 0.06 % NA SOLN
1.0000 | Freq: Four times a day (QID) | NASAL | 1 refills | Status: DC
Start: 2021-08-16 — End: 2021-10-12

## 2021-08-16 NOTE — Addendum Note (Signed)
Addended by: Hillard Danker A on: 08/16/2021 08:54 AM   Modules accepted: Orders

## 2021-08-27 ENCOUNTER — Other Ambulatory Visit: Payer: Self-pay

## 2021-08-27 ENCOUNTER — Encounter: Payer: Self-pay | Admitting: Internal Medicine

## 2021-08-27 ENCOUNTER — Ambulatory Visit (INDEPENDENT_AMBULATORY_CARE_PROVIDER_SITE_OTHER): Payer: Medicare Other | Admitting: Internal Medicine

## 2021-08-27 VITALS — BP 120/70 | HR 59 | Resp 18 | Ht 76.0 in | Wt 211.0 lb

## 2021-08-27 DIAGNOSIS — G300 Alzheimer's disease with early onset: Secondary | ICD-10-CM

## 2021-08-27 DIAGNOSIS — Z23 Encounter for immunization: Secondary | ICD-10-CM

## 2021-08-27 DIAGNOSIS — Z13220 Encounter for screening for lipoid disorders: Secondary | ICD-10-CM | POA: Diagnosis not present

## 2021-08-27 DIAGNOSIS — K219 Gastro-esophageal reflux disease without esophagitis: Secondary | ICD-10-CM | POA: Diagnosis not present

## 2021-08-27 DIAGNOSIS — J3089 Other allergic rhinitis: Secondary | ICD-10-CM

## 2021-08-27 DIAGNOSIS — F0280 Dementia in other diseases classified elsewhere without behavioral disturbance: Secondary | ICD-10-CM

## 2021-08-27 DIAGNOSIS — Z0000 Encounter for general adult medical examination without abnormal findings: Secondary | ICD-10-CM | POA: Diagnosis not present

## 2021-08-27 DIAGNOSIS — G4733 Obstructive sleep apnea (adult) (pediatric): Secondary | ICD-10-CM

## 2021-08-27 LAB — COMPREHENSIVE METABOLIC PANEL
ALT: 19 U/L (ref 0–53)
AST: 19 U/L (ref 0–37)
Albumin: 4.1 g/dL (ref 3.5–5.2)
Alkaline Phosphatase: 77 U/L (ref 39–117)
BUN: 21 mg/dL (ref 6–23)
CO2: 30 mEq/L (ref 19–32)
Calcium: 9.4 mg/dL (ref 8.4–10.5)
Chloride: 103 mEq/L (ref 96–112)
Creatinine, Ser: 0.98 mg/dL (ref 0.40–1.50)
GFR: 80.14 mL/min (ref >60.00)
Glucose, Bld: 87 mg/dL (ref 70–99)
Potassium: 4.5 mEq/L (ref 3.5–5.1)
Sodium: 138 mEq/L (ref 135–145)
Total Bilirubin: 0.6 mg/dL (ref 0.2–1.2)
Total Protein: 6.9 g/dL (ref 6.0–8.3)

## 2021-08-27 LAB — CBC
HCT: 42.1 % (ref 39.0–52.0)
Hemoglobin: 14.1 g/dL (ref 13.0–17.0)
MCHC: 33.5 g/dL (ref 30.0–36.0)
MCV: 91.4 fl (ref 78.0–100.0)
Platelets: 213 10*3/uL (ref 150.0–400.0)
RBC: 4.61 Mil/uL (ref 4.22–5.81)
RDW: 13.2 % (ref 11.5–15.5)
WBC: 8.1 10*3/uL (ref 4.0–10.5)

## 2021-08-27 LAB — LIPID PANEL
Cholesterol: 188 mg/dL (ref 0–200)
HDL: 74.4 mg/dL (ref >39.00)
LDL Cholesterol: 92 mg/dL (ref 0–99)
NonHDL: 113.85
Total CHOL/HDL Ratio: 3
Triglycerides: 107 mg/dL (ref 0.0–149.0)
VLDL: 21.4 mg/dL (ref 0.0–40.0)

## 2021-08-27 NOTE — Progress Notes (Signed)
Subjective:   Patient ID: Brandon Nielsen, male    DOB: [birth year], 65 y.o.   MRN: [id number]  HPI Here for medicare wellness, no new complaints. Please see A/P for status and treatment of chronic medical problems.   HPI #2: Here for follow up medical conditions  Diet: heart healthy Physical activity: sedentary Depression/mood screen: negative Hearing: intact to whispered voice Visual acuity: grossly normal, performs annual eye exam  ADLs: capable Fall risk: none Home safety: good Cognitive evaluation: intact to orientation, naming, recall and repetition, some progressive neuro symptoms but overall slow progression EOL planning: adv directives discussed  Flowsheet Row Office Visit from 08/27/2021 in [facility] Total Score 0        Fall Risk 03/02/2021  Falls in the past year? 0  Was there an injury with Fall? 0  Fall Risk Category Calculator 0  Fall Risk Category Low  Patient Fall Risk Level Low fall risk    I have personally reviewed and have noted 1. The patient's medical and social history - reviewed today no changes 2. Their use of alcohol, tobacco or illicit drugs 3. Their current medications and supplements 4. The patient's functional ability including ADL's, fall risks, home safety risks and hearing or visual impairment. 5. Diet and physical activities 6. Evidence for depression or mood disorders 7. Care team reviewed and updated 8.  The patient is not on an opioid pain medication.  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   HENT: Negative.    Eyes: Negative.   Respiratory:  Negative for cough, chest tightness and shortness of breath.   Cardiovascular:  Negative for chest pain, palpitations and leg swelling.  Gastrointestinal:  Negative for abdominal distention, abdominal pain, constipation, diarrhea, nausea and vomiting.  Musculoskeletal: Negative.   Skin: Negative.   Neurological: Negative.   Psychiatric/Behavioral: Negative.     Objective:  Physical Exam Constitutional:      Appearance: He is well-developed.  HENT:     Head: Normocephalic and atraumatic.  Cardiovascular:     Rate and Rhythm: Normal rate and regular rhythm.  Pulmonary:     Effort: Pulmonary effort is normal. No respiratory distress.     Breath sounds: Normal breath sounds. No wheezing or rales.  Abdominal:     General: Bowel sounds are normal. There is no distension.     Palpations: Abdomen is soft.     Tenderness: There is no abdominal tenderness. There is no rebound.  Musculoskeletal:     Cervical back: Normal range of  motion.  Skin:    General: Skin is warm and dry.  Neurological:     Mental Status: He is alert and oriented to person, place, and time.     Coordination: Coordination normal.    Vitals:   08/27/21 1012  BP: 120/70  Pulse: (!) 59  Resp: 18  SpO2: 98%  Weight: 211 lb (95.7 kg)  Height: 6\' 4"  (1.93 m)   This visit occurred during the SARS-CoV-2 public health emergency.  Safety protocols were in place, including screening questions prior to the visit, additional usage of staff PPE, and extensive cleaning of exam room while observing  appropriate contact time as indicated for disinfecting solutions.   Assessment & Plan:  Prevnar 20

## 2021-08-27 NOTE — Patient Instructions (Addendum)
We will check the labs today. We have given you the pneumonia vaccine today.

## 2021-08-29 ENCOUNTER — Ambulatory Visit (INDEPENDENT_AMBULATORY_CARE_PROVIDER_SITE_OTHER): Payer: Medicare Other | Admitting: Neurology

## 2021-08-29 ENCOUNTER — Encounter: Payer: Self-pay | Admitting: Neurology

## 2021-08-29 ENCOUNTER — Other Ambulatory Visit: Payer: Self-pay

## 2021-08-29 VITALS — BP 134/78 | HR 67 | Ht 76.0 in | Wt 214.0 lb

## 2021-08-29 DIAGNOSIS — F02818 Dementia in other diseases classified elsewhere, unspecified severity, with other behavioral disturbance: Secondary | ICD-10-CM | POA: Diagnosis not present

## 2021-08-29 DIAGNOSIS — F32A Depression, unspecified: Secondary | ICD-10-CM | POA: Diagnosis not present

## 2021-08-29 DIAGNOSIS — G300 Alzheimer's disease with early onset: Secondary | ICD-10-CM

## 2021-08-29 DIAGNOSIS — F419 Anxiety disorder, unspecified: Secondary | ICD-10-CM

## 2021-08-29 MED ORDER — MEMANTINE HCL 10 MG PO TABS: ORAL_TABLET | ORAL | 11 refills | Status: DC

## 2021-08-29 MED ORDER — DONEPEZIL HCL 10 MG PO TABS: 10.0000 mg | ORAL_TABLET | Freq: Every day | ORAL | 11 refills | Status: DC

## 2021-08-29 MED ORDER — ESCITALOPRAM OXALATE 20 MG PO TABS: 20.0000 mg | ORAL_TABLET | Freq: Every day | ORAL | 11 refills | Status: DC

## 2021-08-29 NOTE — Assessment & Plan Note (Signed)
Still using dental appliance.

## 2021-08-29 NOTE — Assessment & Plan Note (Signed)
Taking aciphex 20 mg daily and controlling symptoms well.

## 2021-08-29 NOTE — Assessment & Plan Note (Signed)
Uses atrovent nasal spray up to QID for symptoms. Refilled recently.

## 2021-08-29 NOTE — Assessment & Plan Note (Signed)
Flu shot up to date for season. Covid-19 up to date for season. Pneumonia given prevnar 20 to complete. Shingrix complete. Tetanus due in 2024. Colonoscopy due 2025. Counseled about sun safety and mole surveillance. Counseled about the dangers of distracted driving. Given 10 year screening recommendations.

## 2021-08-29 NOTE — Patient Instructions (Signed)
Increase Lexapro to 20mg  daily  2. Start Memantine 10mg : take 1 tablet every night for 2 weeks, then increase to 1 tablet twice a day  3. Continue Donepezil 10mg  daily  4. Resume occupational therapy  5. Follow-up in 6 months, call for any changes   FALL PRECAUTIONS: Be cautious when walking. Scan the area for obstacles that may increase the risk of trips and falls. When getting up in the mornings, sit up at the edge of the bed for a few minutes before getting out of bed. Consider elevating the bed at the head end to avoid drop of blood pressure when getting up. Walk always in a well-lit room (use night lights in the walls). Avoid area rugs or power cords from appliances in the middle of the walkways. Use a walker or a cane if necessary and consider physical therapy for balance exercise. Get your eyesight checked regularly.  HOME SAFETY: Consider the safety of the kitchen when operating appliances like stoves, microwave oven, and blender. Consider having supervision and share cooking responsibilities until no longer able to participate in those. Accidents with firearms and other hazards in the house should be identified and addressed as well.  ABILITY TO BE LEFT ALONE: If patient is unable to contact 911 operator, consider using LifeLine, or when the need is there, arrange for someone to stay with patients. Smoking is a fire hazard, consider supervision or cessation. Risk of wandering should be assessed by caregiver and if detected at any point, supervision and safe proof recommendations should be instituted.   RECOMMENDATIONS FOR ALL PATIENTS WITH MEMORY PROBLEMS: 1. Continue to exercise (Recommend 30 minutes of walking everyday, or 3 hours every week) 2. Increase social interactions - continue going to [place] and enjoy social gatherings with friends and family 3. Eat healthy, avoid fried foods and eat more fruits and vegetables 4. Maintain adequate blood pressure, blood sugar, and blood  cholesterol level. Reducing the risk of stroke and cardiovascular disease also helps promoting better memory. 5. Avoid stressful situations. Live a simple life and avoid aggravations. Organize your time and prepare for the next day in anticipation. 6. Sleep well, avoid any interruptions of sleep and avoid any distractions in the bedroom that may interfere with adequate sleep quality 7. Avoid sugar, avoid sweets as there is a strong link between excessive sugar intake, diabetes, and cognitive impairment We discussed the Mediterranean diet, which has been shown to help patients reduce the risk of progressive memory disorders and reduces cardiovascular risk. This includes eating fish, eat fruits and green leafy vegetables, nuts like almonds and hazelnuts, walnuts, and also use olive oil. Avoid fast foods and fried foods as much as possible. Avoid sweets and sugar as sugar use has been linked to worsening of memory function.  There is always a concern of gradual progression of memory problems. If this is the case, then we may need to adjust level of care according to patient needs. Support, both to the patient and caregiver, should then be put into place.

## 2021-08-29 NOTE — Assessment & Plan Note (Signed)
Seeing neurology and is taking aricept 10 mg daily. He does feel that this has had some progression since diagnosis but overall slow progression.

## 2021-08-29 NOTE — Progress Notes (Signed)
NEUROLOGY FOLLOW UP OFFICE NOTE  Brandon Nielsen [id number] 1955-10-29  HISTORY OF PRESENT ILLNESS: I had the pleasure of seeing Brandon Nielsen in follow-up in the neurology clinic on 08/29/2021.  The patient was last seen 6 months ago for early onset Alzheimer's dementia. He is again accompanied by his wife [name] who helps supplement the history today.  Records and images were personally reviewed where available.  Since his last visit, he saw Occupational therapy and Speech therapy. They feel OT helped him better and would like to resume therapy. He reports his long-term memory is excellent, but his short-term memory is problematic. He reports he needs staff to keep his meetings on the Outlook calendar to help him. 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 He can sit and handle meetings, but has difficulty using the computer. He states his fingers have slowed down and it takes longer to accomplish things. [name] feels that things are progressing more quickly. They are seeing more confusion and difficulty with processing information. He visited his son in [place] where his son noticed that he needs repeated instructions and sentences to get a point across. There was one time he used the bathroom and his son went to get him, he could not find him in the bathroom and found him in a corner against the door, angry that he was locked in. Apparently he was trying to get in a janitor's closet. This has happened before, but does not occur as much at home. He gets more frustrated with her when she tries to help him, this morning there was some agitation as she was explaining how he needed to layer his jackets. They have not noticed much change in mood with Lexapro 10mg  daily. He is taking Donepezil 10mg  daily. He notes occasional diarrhea. Appetite is good. He is able to bathe himself but needs help with dressing. manages medications, finances. He does not drive. She is  not sure how well he sleeps, he reports he sleeps well, but she feels he is awake at night, unsure what time it is. No wandering behaviors, no hallucinations. He denies any headaches, dizziness, no falls.     History on Initial Assessment 03/02/2021: This is a pleasant 65 year old left-handed man with a history of GERD, presenting to establish care for early onset Alzheimer's disease. His wife 03/04/2021 is present to provide additional information. She started noticing confusion around 7-8 years ago, worse in the past year. She reports he has changed so much in his ability to function. She took over finances 2-3 years ago because he was having a harder time. She helps with medications and he takes them by himself, rarely making a mistake. He stopped driving a year ago. He is able to bathe independently but would put on clothes inside out. He underwent Neuropsychological testing with Dr. 71 in March 2021 with note of profound and striking visuospatial impairment including extremely low scores on measures of perceptual functioning and marked constructional apraxia. He also had difficulties with executive functioning. Speed of processing could not be assessed related to extreme difficulty interacting with test forms visually. It was also noted that memory was relatively spared. 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  I personally reviewed brain MRI without contrast done in 12/2019 and 12/2019 did not show interval change, there was moderate diffuse volume loss, slightly  more pronounced in the bilateral parietal lobes, right greater than left. It was noted that atrophy pattern is not classic for PCA. He was evaluated by [facility] dementia specialist Dr. [name] in 01/2020 and it was felt that most likely underlying pathological process is Alzheimer's versus frontotemporal lobar  degeneration. He was started on Donepezil which caused nausea and diarrhea, switched to Rivastigmine. This caused worse vomiting, weakness, so he resumed Donepezil 10mg  daily. He was last seen at [facility] in 09/2020. They have decided to stay locally for now and wanted to establish local care.  His wife reports that cognitive issues are not noticeable with brief conversations. He is more confused at night. They have an aide coming once a week. His son has noticed he has not idea what time of the day it is without cues. She drives him to the office where his sister also works, he sits at his desk and does not do anything. 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 He says he works a 5-day work week. He notes mild depression but he is able to function and does not feel the need to take medication. His wife however states that he is pretty depressed and gets emotional. She shows a video where he has difficulty following instructions, trying to eat with his knife. He starts crying saying he is tired of pushing himself. Sleep is pretty good, no wandering behaviors. No paranoia or hallucinations.   Laboratory Data: Lab Results  Component Value Date   TSH 0.83 11/17/2019   Lab Results  Component Value Date   VITAMINB12 228 11/17/2019    PAST MEDICAL HISTORY: Past Medical History:  Diagnosis Date   Chicken pox    as a child   Fuch's endothelial dystrophy    GERD (gastroesophageal reflux disease)    off meds - after loosing weight   H/O infectious mononucleosis    in college   Hepatitis     MEDICATIONS: Current Outpatient Medications on File Prior to Visit  Medication Sig Dispense Refill   Cholecalciferol (VITAMIN D3) 10 MCG (400 UNIT) tablet Take 400 Units by mouth daily.     donepezil (ARICEPT) 10 MG tablet Take 1 tablet (10 mg total) by mouth at bedtime. 30 tablet 1   escitalopram  (LEXAPRO) 10 MG tablet Take 1 tablet (10 mg total) by mouth daily. 30 tablet 11   ipratropium (ATROVENT) 0.06 % nasal spray Place 1 spray into both nostrils 4 (four) times daily. 15 mL 1   prednisoLONE acetate (PRED FORTE) 1 % ophthalmic suspension      prednisoLONE acetate (PRED MILD) 0.12 % ophthalmic suspension Place 1 drop into both eyes daily.     ranitidine (ZANTAC) 150 MG capsule Take 150 mg by mouth as needed for heartburn.     RABEprazole (ACIPHEX) 20 MG tablet TAKE 1 TABLET ONCE DAILY. (Patient not taking: Reported on 08/29/2021) 30 tablet 0   No current facility-administered medications on file prior to visit.    ALLERGIES: No Known Allergies  FAMILY HISTORY: Family History  Problem Relation Age of Onset   Cancer Mother        cervical cancer/non-hodgikins lymphoma   Hypertension Mother    Colon polyps Mother    Heart disease Father    Hypertension Father     SOCIAL HISTORY: Social History   Socioeconomic History   Marital  status: Married    Spouse name: Not on file   Number of children: 3   Years of education: 71   Highest education level: Not on file  Occupational History   Occupation: lawyer    Employer: [employer]  Tobacco Use   Smoking status: Never   Smokeless tobacco: Never  Vaping Use   Vaping Use: Never used  Substance and Sexual Activity   Alcohol use: Yes    Alcohol/week: 8.0 - 10.0 standard drinks    Types: 8 - 10 Standard drinks or equivalent per week    Comment: occ   Drug use: No   Sexual activity: Yes    Partners: Female  Other Topics Concern   Not on file  Social History Narrative   HSG, 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 Married '77- 13/divorced. Married '95.  3 adult children - '82, '85, '86 (step daughter). Work - TEFL teacher.    Left handed             Social Determinants of Health   Financial Resource Strain: Not on file  Food Insecurity: Not on file  Transportation Needs: Not  on file  Physical Activity: Not on file  Stress: Not on file  Social Connections: Not on file  Intimate Partner Violence: Not on file     PHYSICAL EXAM: Vitals:   08/29/21 1023  BP: 134/78  Pulse: 67  SpO2: 98%   General: No acute distress Head:  Normocephalic/atraumatic Skin/Extremities: No rash, no edema Neurological Exam: alert and oriented to person, place, year/day of week. Month is December. No aphasia or dysarthria, fluent speech. Fund of knowledge is reduced.  Recent and remote memory are impaired, 1/3 delayed recall.  Attention and concentration are reduced, 0/5 WORLD backwards, unable to do serial 7s. Difficulty following 2-step commands. Cranial nerves: Pupils equal, round. Extraocular movements intact with no nystagmus. Appears to have extinction to double simultaneous stimulation on left side. No facial asymmetry.  Motor: Bulk and tone normal, muscle strength 5/5 throughout with no pronator drift.   Finger to nose testing intact.  Gait narrow-based and steady, able to tandem walk adequately.  Romberg negative.   IMPRESSION: This is a pleasant 65 yo LH man with a history of GERD, with early onset Alzheimer's disease, possibly posterior cortical atrophy. He again is noted to have visuospatial difficulties, with extinction on the left side. He is having more difficulties with following complex instructions and processing as well as continued mood changes. We discussed adding on Memantine to Donepezil, side effects and expectations discussed. Start Memantine 10mg  qhs for 2 weeks, then increase to 10mg  BID. We also discussed increasing Lexapro to 20mg  daily. They are interested in resuming Occupational therapy. We again discussed the importance of control of vascular risk factors, physical exercise, brain stimulation exercises, MIND diet for overall brain health. Caregiver support provided. Follow-up in 6 months, they know to call for any changes.    Thank you for allowing me to  participate in his care.  Please do not hesitate to call for any questions or concerns.    , M.D.   CC: Dr. 

## 2021-09-11 ENCOUNTER — Other Ambulatory Visit: Payer: Self-pay | Admitting: Internal Medicine

## 2021-10-12 ENCOUNTER — Other Ambulatory Visit: Payer: Self-pay | Admitting: Internal Medicine

## 2021-10-13 ENCOUNTER — Other Ambulatory Visit: Payer: Self-pay | Admitting: Internal Medicine

## 2021-11-20 ENCOUNTER — Telehealth: Payer: Self-pay | Admitting: Occupational Therapy

## 2021-11-20 DIAGNOSIS — R41844 Frontal lobe and executive function deficit: Secondary | ICD-10-CM

## 2021-11-20 DIAGNOSIS — R29818 Other symptoms and signs involving the nervous system: Secondary | ICD-10-CM

## 2021-11-20 NOTE — Telephone Encounter (Signed)
Dr. Karel Jarvis,   I am the occupational therapist who was working with Mr. Barth last summer at [facility] outpatient rehab at [facility]. He called our clinic today requesting to return to therapy, but since it has been 7+ months since I have seen him we will require a new referral to be sent in. I noticed your last appointment w/ him was in November, so if you need to see him again prior to sending a new referral, let me know and I will communicate that to him. If not, and you agree that it is appropriate for him to resume OT, would you mind sending a referral in to our [facility] location? I will also be happy to fax a referral form over to you, if that is more convenient.  Best, Rosie Fate, MSOT, OTR/L   [facility] [address] [place], [place] [address] Phone: [phone]

## 2021-11-21 NOTE — Telephone Encounter (Signed)
Pls send in Occupational therapy referral as requested below, thanks!

## 2021-11-26 ENCOUNTER — Other Ambulatory Visit: Payer: Self-pay | Admitting: Internal Medicine

## 2021-11-28 ENCOUNTER — Encounter: Payer: Self-pay | Admitting: Occupational Therapy

## 2021-11-28 ENCOUNTER — Ambulatory Visit: Payer: Medicare Other | Attending: Neurology | Admitting: Occupational Therapy

## 2021-11-28 ENCOUNTER — Other Ambulatory Visit: Payer: Self-pay

## 2021-11-28 DIAGNOSIS — R29818 Other symptoms and signs involving the nervous system: Secondary | ICD-10-CM | POA: Diagnosis present

## 2021-11-28 DIAGNOSIS — R41841 Cognitive communication deficit: Secondary | ICD-10-CM | POA: Diagnosis present

## 2021-11-28 DIAGNOSIS — R41844 Frontal lobe and executive function deficit: Secondary | ICD-10-CM | POA: Diagnosis not present

## 2021-11-28 DIAGNOSIS — R41842 Visuospatial deficit: Secondary | ICD-10-CM | POA: Diagnosis present

## 2021-11-29 NOTE — Therapy (Signed)
[facility] [address] [place], [place], [address] Phone: [phone]   Fax:  [phone]  Occupational Therapy Evaluation  Patient Details  Name: Brandon Nielsen MRN: [id number] Date of Birth: [birth year] Referring Provider (OT): Patrcia Dolly, MD (Neurology)   Encounter Date: 11/28/2021   OT End of Session - 11/28/21 1015     Visit Number 1    Number of Visits 13    Date for OT Re-Evaluation 02/26/22    Authorization Type Medicare (primary); BCBS (secondary)    Authorization Time Period VL: [place]    Progress Note Due on Visit 10    OT Start Time 1010    OT Stop Time 1055    OT Time Calculation (min) 45 min    Activity Tolerance Patient tolerated treatment well    Behavior During Therapy WFL for tasks assessed/performed            Past Medical History:  Diagnosis Date   Chicken pox    as a child   Fuch's endothelial dystrophy    GERD (gastroesophageal reflux disease)    off meds - after loosing weight   H/O infectious mononucleosis    in college   Hepatitis     Past Surgical History:  Procedure Laterality Date   COLONOSCOPY     CORNEAL TRANSPLANT  2014   Bil eyes   NASAL SEPTUM SURGERY     at 10 yrs of age    There were no vitals filed for this visit.   Subjective Assessment - 11/28/21 1013     Subjective  "I missed being here." Pt arrives to session and states things have been going "okay" since he was last here in July 2022. He and his wife have moved to a different house since then. Pt also reports he feels like he is having more trouble following conversations and like he's getting "lost" during them, needing a lot of repetition to process the information. Pt also states he has noticed it feels more difficult to coordinate the movements to use his phone and computer keyboard.   Pertinent History Early onset Alzheimer's dementia with behavioral disturbance; anxiety, depression    Patient Stated Goals  "To learn how to function better"    Currently in Pain? No/denies             [facility] OT Assessment - 11/28/21 1016       Assessment   Medical Diagnosis Early onset Alzheimer's dementia with behavioral disturbance    Referring Provider (OT) Patrcia Dolly, MD   Next MD Visit 02/27/22   Hand Dominance Right    Prior Therapy Yes      Balance Screen   Has the patient fallen in the past 6 months No      Home  Environment   Available Help at Discharge Available 24 hours/day    Type of Home House    Home Layout Able to live on main level with bedroom/bathroom   Multi-level   Bathroom Shower/Tub Walk-in Shower    Lives With [employer]   Prior Function     Level of Independence Independent with basic ADLs;   [employer] business working in Multimedia programmer estate; 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 Pt also indicated he is not participating in shopping, laundry, driving, financial management, or medication management at this time; he is able to dial a few well-known numbers using his cell phone, complete simple snack prep and light daily tasks, and often remembers when to take medication, per [place] IADL Scale   Cognition    Overall Cognitive Status Impaired/Different from baseline   Area of Impairment Attention;Memory;Safety/judgement;Awareness;Problem solving   Current Attention Level Focused;Sustained   Memory Decreased short-term memory   Safety/Judgement Decreased awareness of deficits   Awareness Intellectual   Problem Solving Slow processing;Difficulty sequencing   Cognition Comments Able to find 9/9 targets on visual, symbol cancellation tasks; marked 2 targets twice and 1 target 3x, unable to identify already  completing those targets. Demonstrated difficulty w/ task termination     Coordination   9 Hole Peg Test Left    Left 9 Hole Peg Test 1 min, 3 sec             OT Education - 11/28/21 1056     Education Details Provided relevant condition-specific education based on evaluation findings and briefly discussed approaches to therapy (i.e., restoration, compensation and modification)    Person(s) Educated Patient    Methods Explanation    Comprehension Verbalized understanding             OT Short Term Goals - 11/28/21 1115       OT SHORT TERM GOAL #1   Title Caregiver will verbalize understanding of at least 2 available resources related to provision of caregiver support (e.g., day centers, respite care, support groups, etc.)    Baseline Unable to assess at eval    Time 2    Period Weeks    Status New    Target Date 12/14/21      OT SHORT TERM GOAL #2   Title Pt and caregiver will report understanding of potentially beneficial AE or supports of daily living (e.g., dementia clock, tags for missing objects, medication prompts, etc.) to decrease caregiver strain    Baseline Unable to assess at eval    Time 3    Period Weeks    Status New    Target Date 12/21/21             OT Long Term Goals - 11/28/21 1121       OT LONG TERM GOAL #1   Title Pt will be able to complete UB/LB dressing w/ Mod I, incorporating compensatory strategies including AE prn, at least 50% of the time by d/c    Baseline Requires assist w/ dressing tasks    Time 6    Period Weeks    Status New    Target Date 01/11/22      OT LONG TERM GOAL #2   Title Pt will be able to complete 9-Hole Peg Test in 1 min or less to demonstrate improved coordination and processing    Baseline 1 min, 3 sec w/ dominant UE    Time 6    Period Weeks    Status New    Target Date 01/11/22      OT LONG TERM GOAL #3   Title Pt will be able to unlock and complete 1 task on his phone w/ Mod I, using  compensatory/adaptive strategies prn for safety    Baseline Required Min A to unlock phone and write a text message    Time 6    Period Weeks  Status New    Target Date 01/11/22      OT LONG TERM GOAL #4   Title Pt will be able to complete voice-activated calling using his phone in at least 2 attempt w/ Mod I    Baseline Increased difficulty w/ cell phone use    Time 6    Period Weeks    Status New    Target Date 01/11/22             Plan - 11/28/21 1100     Clinical Impression Statement Pt is a 66 y/o male who presents to OP OT due to executive functioning deficit 2/2 early onset Alzheimer's dementia w/ behavioral disturbance. PMH also includes depression, and anxiety. 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 Pt will benefit from skilled occupational therapy services to address safety awareness and introduction of compensatory strategies, including AE prn, to facilitate increased participation and sense of independence w/ functional tasks and activities.    OT Occupational Profile and History Detailed Assessment- Review of Records and additional review of physical, cognitive, psychosocial history related to current functional performance    Occupational performance deficits (Please refer to evaluation for details): ADL's;IADL's;Work;Leisure;Social Participation    Body Structure / Function / Physical Skills Vision;ADL;IADL;Decreased knowledge of use of DME    Cognitive Skills Memory;Orientation;Problem Solve;Safety Awareness;Sequencing    Psychosocial Skills Environmental  Adaptations;Interpersonal Interaction;Routines and Behaviors    Rehab Potential Fair    Clinical Decision Making Several treatment options, min-mod task modification  necessary    Comorbidities Affecting Occupational Performance: None    Modification or Assistance to Complete Evaluation  Min-Moderate modification of tasks or assist with assess necessary to complete eval    OT Frequency 2x / week    OT Duration 6 weeks    OT Treatment/Interventions Self-care/ADL training;DME and/or AE instruction;Therapeutic activities;Psychosocial skills training;Cognitive remediation/compensation;Visual/perceptual remediation/compensation;Therapeutic exercise;Functional Mobility Training;Patient/family education    Plan Practice using phone to make phone calls, texts; introduce compensatory strategies/AE prn    Consulted and Agree with Plan of Care Patient            Patient will benefit from skilled therapeutic intervention in order to improve the following deficits and impairments:   Body Structure / Function / Physical Skills: Vision, ADL, IADL, Decreased knowledge of use of DME Cognitive Skills: Memory, Orientation, Problem Solve, Safety Awareness, Sequencing Psychosocial Skills: Environmental  Adaptations, Interpersonal Interaction, Routines and Behaviors   Visit Diagnosis: Frontal lobe and executive function deficit  Other symptoms and signs involving the nervous system  Visuospatial deficit  Cognitive communication deficit   Problem List Patient Active Problem List   Diagnosis Date Noted   Early onset Alzheimer's dementia without behavioral disturbance (HCC) 01/13/2020   Routine health maintenance 11/08/2012   Obstructive sleep apnea 01/09/2008   Allergic rhinitis 01/09/2008   GASTROESOPHAGEAL REFLUX DISEASE 01/09/2008    Rosie Fate, MSOT, OTR/L 11/28/2021, 11:22 AM  [facility] [address] [place], [place], [address] Phone: [phone]   Fax:  [phone]  Name: Brandon Nielsen MRN: [id number] Date of Birth: [birth year]

## 2021-12-03 ENCOUNTER — Encounter: Payer: Self-pay | Admitting: Occupational Therapy

## 2021-12-03 ENCOUNTER — Ambulatory Visit: Payer: Medicare Other | Admitting: Occupational Therapy

## 2021-12-03 ENCOUNTER — Other Ambulatory Visit: Payer: Self-pay

## 2021-12-03 DIAGNOSIS — R41841 Cognitive communication deficit: Secondary | ICD-10-CM

## 2021-12-03 DIAGNOSIS — R41842 Visuospatial deficit: Secondary | ICD-10-CM

## 2021-12-03 DIAGNOSIS — R29818 Other symptoms and signs involving the nervous system: Secondary | ICD-10-CM

## 2021-12-03 DIAGNOSIS — R41844 Frontal lobe and executive function deficit: Secondary | ICD-10-CM | POA: Diagnosis not present

## 2021-12-03 NOTE — Therapy (Signed)
[place]. [place], [place], [address] Phone: [phone]   Fax:  [phone]  Occupational Therapy Treatment  Patient Details  Name: Brandon Nielsen MRN: [id number] Date of Birth: 03-09-1956 Referring Provider (OT): Ellouise Newer, MD   Encounter Date: 12/03/2021   OT End of Session - 12/03/21 1621     Visit Number 2    Number of Visits 13    Date for OT Re-Evaluation 02/26/22    Authorization Type Medicare (primary); BCBS (secondary)    Authorization Time Period VL: [place]    Progress Note Due on Visit 10    OT Start Time 1355    OT Stop Time 1445    OT Time Calculation (min) 50 min    Activity Tolerance Patient tolerated treatment well    Behavior During Therapy WFL for tasks assessed/performed            Past Medical History:  Diagnosis Date   Chicken pox    as a child   Fuch's endothelial dystrophy    GERD (gastroesophageal reflux disease)    off meds - after loosing weight   H/O infectious mononucleosis    in college   Hepatitis     Past Surgical History:  Procedure Laterality Date   COLONOSCOPY     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 No/denies             OT Education - 12/03/21 1627     Education Details Discussed functional pt-specific goals and additional current limitations related to daily activities, per spouse report. Briefly reviewed approaches to therapy in OP setting (i.e. compensation and modification). Loosely defined and described visual perception  and visual perceptual hierarchy, additionally providing examples of how this may impact pt at home.    Person(s) Educated Patient;Spouse    Methods Explanation    Comprehension Verbalized understanding             OT Treatment - 12/03/21    IADLs: Communication Management Practiced using cell phone to make calls, find pictures, and send text messages. OT problem-solved w/ pt to enlarge text size for increased visibility as well as to decrease distracting stimuli; demonstrated success when attempting to make a phone call after increasing text size. Pt continues to demonstrate difficulty sequencing multiple steps (e.g., unlocking phone, finding phone icon, selecting number), so OT went through "Favorites" in contacts and identified most called numbers w/ pt and his spouse. OT also discussed potential benefit of text shortcuts, talk-to-text options, and predictive text; to be trialed next session. Due to difficulty w/ phone use and idea to use "work" phone more often, OT asked pt's spouse to take a picture of phone so pt is able to practice and develop beneficial compensatory strategies in therapy prn. Considering difficulty observed w/ sequencing and distinguishing targets from other distracting visual stimuli, OT also recommended re-organizing pt's phone screen to decrease options and help pt focus  on most used apps.            OT Short Term Goals - 12/03/21 1622       OT SHORT TERM GOAL #1   Title Caregiver will verbalize understanding of at least 2 available resources related to provision of caregiver support (e.g., day centers, respite care, support groups, etc.)    Baseline Unable to assess at eval    Time 2    Period Weeks    Status On-going    Target Date 12/14/21      OT SHORT TERM GOAL #2   Title Pt and caregiver will report understanding of potentially beneficial AE or supports of daily living (e.g., dementia clock, tags for missing objects, medication prompts, etc.) to  decrease caregiver strain    Baseline Unable to assess at eval    Time 3    Period Weeks    Status On-going    Target Date 12/21/21             OT Long Term Goals - 12/03/21 1622       OT LONG TERM GOAL #1   Title Pt will be able to complete UB/LB dressing w/ Mod I, incorporating compensatory strategies including AE prn, at least 50% of the time by d/c    Baseline Requires assist w/ dressing tasks    Time 6    Period Weeks    Status On-going    Target Date 01/11/22      OT LONG TERM GOAL #2   Title Pt will be able to complete 9-Hole Peg Test in 1 min or less to demonstrate improved coordination and processing    Baseline 1 min, 3 sec w/ dominant UE    Time 6    Period Weeks    Status On-going    Target Date 01/11/22      OT LONG TERM GOAL #3   Title Pt will be able to unlock and complete 1 task on his phone w/ Mod I, using compensatory/adaptive strategies prn for safety    Baseline Required Min A to unlock phone and write a text message    Time 6    Period Weeks    Status On-going    Target Date 01/11/22      OT LONG TERM GOAL #4   Title Pt will be able to complete voice-activated calling using his phone in at least 2 attempt w/ Mod I    Baseline Increased difficulty w/ cell phone use    Time 6    Period Weeks    Status On-going    Target Date 01/11/22             Plan - 12/03/21 1622     Clinical Impression Statement Pt's spouse was present this session and OT inquired about caregiver goals. Pt's wife identified that pt experiences difficulty using the phone, plugs (e.g., Industrial/product designer), TV remote, and locating objects in cabinets despite provided word labels. OT to address options for compensatory/adaptive strategies in OP therapy and provide pt-specific options to then trial at home; pt and spouse are agreeable at this time. Pt will likely benefit from labels/reminders including both words and pictures due to visual perceptual deficits impacting visual  memory, visuocognition, and adaptation.    OT Occupational Profile and History Detailed Assessment- Review of Records and additional review of physical, cognitive, psychosocial history related to current functional performance    Occupational performance deficits (Please refer to evaluation for details): ADL's;IADL's;Work;Leisure;Social Participation  Body Structure / Function / Physical Skills Vision;ADL;IADL;Decreased knowledge of use of DME    Cognitive Skills Memory;Orientation;Problem Solve;Safety Awareness;Sequencing    Psychosocial Skills Environmental  Adaptations;Interpersonal Interaction;Routines and Behaviors    Rehab Potential Fair    Clinical Decision Making Several treatment options, min-mod task modification necessary    Comorbidities Affecting Occupational Performance: None    Modification or Assistance to Complete Evaluation  Min-Moderate modification of tasks or assist with assess necessary to complete eval    OT Frequency 2x / week    OT Duration 6 weeks    OT Treatment/Interventions Self-care/ADL training;DME and/or AE instruction;Therapeutic activities;Psychosocial skills training;Cognitive remediation/compensation;Visual/perceptual remediation/compensation;Therapeutic exercise;Functional Mobility Training;Patient/family education    Plan Enlarge phone app icons, practice talk-to-text, develop text shortcuts    Consulted and Agree with Plan of Care Patient            Patient will benefit from skilled therapeutic intervention in order to improve the following deficits and impairments:   Body Structure / Function / Physical Skills: Vision, ADL, IADL, Decreased knowledge of use of DME Cognitive Skills: Memory, Orientation, Problem Solve, Safety Awareness, Sequencing Psychosocial Skills: Environmental  Adaptations, Interpersonal Interaction, Routines and Behaviors   Visit Diagnosis: Frontal lobe and executive function deficit  Other symptoms and signs involving the  nervous system  Visuospatial deficit  Cognitive communication deficit   Problem List Patient Active Problem List   Diagnosis Date Noted   Early onset Alzheimer's dementia without behavioral disturbance ([place]) 01/13/2020   Routine health maintenance 11/08/2012   Obstructive sleep apnea 01/09/2008   Allergic rhinitis 01/09/2008   GASTROESOPHAGEAL REFLUX DISEASE 01/09/2008    Kathrine Cords, MSOT, OTR/L 12/03/2021, 4:29 PM  [place]. [place], [place], [address] Phone: [phone]   Fax:  [phone]  Name: Brandon Nielsen MRN: [id number] Date of Birth: August 22, 1956

## 2021-12-05 ENCOUNTER — Encounter: Payer: Self-pay | Admitting: Occupational Therapy

## 2021-12-05 ENCOUNTER — Ambulatory Visit: Payer: Medicare Other | Admitting: Occupational Therapy

## 2021-12-05 ENCOUNTER — Other Ambulatory Visit: Payer: Self-pay

## 2021-12-05 DIAGNOSIS — R41844 Frontal lobe and executive function deficit: Secondary | ICD-10-CM | POA: Diagnosis not present

## 2021-12-05 DIAGNOSIS — R41842 Visuospatial deficit: Secondary | ICD-10-CM

## 2021-12-05 DIAGNOSIS — R29818 Other symptoms and signs involving the nervous system: Secondary | ICD-10-CM

## 2021-12-05 DIAGNOSIS — R41841 Cognitive communication deficit: Secondary | ICD-10-CM

## 2021-12-05 NOTE — Therapy (Signed)
[facility] [address] [place], [place], [address] Phone: [phone]   Fax:  [phone]  Occupational Therapy Treatment  Patient Details  Name: Brandon Nielsen MRN: [id number] Date of Birth: [birth year] Referring Provider (OT): Patrcia Dolly, MD   Encounter Date: 12/05/2021   OT End of Session - 12/05/21 0936     Visit Number 3    Number of Visits 13    Date for OT Re-Evaluation 02/26/22    Authorization Type Medicare (primary); BCBS (secondary)    Authorization Time Period VL: [place]    Progress Note Due on Visit 10    OT Start Time 0935    OT Stop Time 1015    OT Time Calculation (min) 40 min    Activity Tolerance Patient tolerated treatment well    Behavior During Therapy WFL for tasks assessed/performed            Past Medical History:  Diagnosis Date   Chicken pox    as a child   Fuch's endothelial dystrophy    GERD (gastroesophageal reflux disease)    off meds - after loosing weight   H/O infectious mononucleosis    in college   Hepatitis     Past Surgical History:  Procedure Laterality Date   COLONOSCOPY     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 No/denies             OT Treatment - 12/05/21    IADLs: Communication Management  Pt arrived to session w/ cell phone home screen cleared of distracting/unused apps for decreased distracting visual stimuli. OT also enlarged size of icons on phone in order to improve visibility. OT then facilitated learning and subsequent practice of using  voice activation to make phone calls and send text messages from lock screen. Pt required breakdown of task, chaining, and repetition for sequencing and understanding w/ increased success observed when verbally activating digital assist compared to activating assistant using side button. OT also developed simple 3-step visual aid for pt w/ like-images to improve carryover to home; pt would demonstrate use of digital assistant following visual aid w/ OT completing modifications prn until pt was able to sequence steps w/ decreased assistance.            OT Short Term Goals - 12/03/21 1622       OT SHORT TERM GOAL #1   Title Caregiver will verbalize understanding of at least 2 available resources related to provision of caregiver support (e.g., day centers, respite care, support groups, etc.)    Baseline Unable to assess at eval    Time 2    Period Weeks    Status On-going    Target Date 12/14/21      OT SHORT TERM GOAL #2   Title Pt and caregiver will report understanding of potentially beneficial AE or supports of daily living (e.g., dementia clock, tags for missing objects, medication prompts, etc.) to decrease caregiver strain  Baseline Unable to assess at eval    Time 3    Period Weeks    Status On-going    Target Date 12/21/21             OT Long Term Goals - 12/03/21 1622       OT LONG TERM GOAL #1   Title Pt will be able to complete UB/LB dressing w/ Mod I, incorporating compensatory strategies including AE prn, at least 50% of the time by d/c    Baseline Requires assist w/ dressing tasks    Time 6    Period Weeks    Status On-going    Target Date 01/11/22      OT LONG TERM GOAL #2   Title Pt will be able to complete 9-Hole Peg Test in 1 min or less to demonstrate improved coordination and processing    Baseline 1 min, 3 sec w/ dominant UE    Time 6    Period Weeks    Status On-going    Target Date 01/11/22      OT LONG TERM GOAL #3   Title Pt will be able to  unlock and complete 1 task on his phone w/ Mod I, using compensatory/adaptive strategies prn for safety    Baseline Required Min A to unlock phone and write a text message    Time 6    Period Weeks    Status On-going    Target Date 01/11/22      OT LONG TERM GOAL #4   Title Pt will be able to complete voice-activated calling using his phone in at least 2 attempt w/ Mod I    Baseline Increased difficulty w/ cell phone use    Time 6    Period Weeks    Status On-going    Target Date 01/11/22             Plan - 12/05/21 1433     Clinical Impression Statement OT continued to address communication management using pt's cell phone to improve safety and sense of independence. Due to known visual perceptual limitations and difficulty w/ sequencing observed during evaluation and preceeding tx session, OT trialed activation of digital assistant to verbally accomplish tasks (e.g., make calls, set reminders, check the weather) vs going through steps of unlocking phone, finding correct app, and then completing task. Pt demonstrated fair understanding of steps, experiencing difficulty differentiating between voice activation and button activation options, but will likely benefit from increased practice. OT also developed corresponding visual aid to improve independence w/ practice at home.    OT Occupational Profile and History Detailed Assessment- Review of Records and additional review of physical, cognitive, psychosocial history related to current functional performance    Occupational performance deficits (Please refer to evaluation for details): ADL's;IADL's;Work;Leisure;Social Participation    Body Structure / Function / Physical Skills Vision;ADL;IADL;Decreased knowledge of use of DME    Cognitive Skills Memory;Orientation;Problem Solve;Safety Awareness;Sequencing    Psychosocial Skills Environmental  Adaptations;Interpersonal Interaction;Routines and Behaviors    Rehab Potential Fair     Clinical Decision Making Several treatment options, min-mod task modification necessary    Comorbidities Affecting Occupational Performance: None    Modification or Assistance to Complete Evaluation  Min-Moderate modification of tasks or assist with assess necessary to complete eval    OT Frequency 2x / week    OT Duration 6 weeks    OT Treatment/Interventions Self-care/ADL training;DME and/or AE instruction;Therapeutic activities;Psychosocial skills training;Cognitive remediation/compensation;Visual/perceptual remediation/compensation;Therapeutic exercise;Functional Mobility Training;Patient/family education  Plan Review and practice using 'Siri'; address FM coordination    Consulted and Agree with Plan of Care Patient            Patient will benefit from skilled therapeutic intervention in order to improve the following deficits and impairments:   Body Structure / Function / Physical Skills: Vision, ADL, IADL, Decreased knowledge of use of DME Cognitive Skills: Memory, Orientation, Problem Solve, Safety Awareness, Sequencing Psychosocial Skills: Environmental  Adaptations, Interpersonal Interaction, Routines and Behaviors   Visit Diagnosis: Frontal lobe and executive function deficit  Other symptoms and signs involving the nervous system  Visuospatial deficit  Cognitive communication deficit   Problem List Patient Active Problem List   Diagnosis Date Noted   Early onset Alzheimer's dementia without behavioral disturbance ([facility]) 01/13/2020   Routine health maintenance 11/08/2012   Obstructive sleep apnea 01/09/2008   Allergic rhinitis 01/09/2008   GASTROESOPHAGEAL REFLUX DISEASE 01/09/2008    Rosie Fate, MSOT, OTR/L 12/05/2021, 2:36 PM  [facility] [address] [place], [place], [address] Phone: [phone]   Fax:  [phone]  Name: Brandon Nielsen MRN: [id number] Date of Birth: 1956-07-14

## 2021-12-10 ENCOUNTER — Ambulatory Visit: Payer: Medicare Other | Admitting: Occupational Therapy

## 2021-12-10 ENCOUNTER — Encounter: Payer: Self-pay | Admitting: Occupational Therapy

## 2021-12-10 ENCOUNTER — Other Ambulatory Visit: Payer: Self-pay

## 2021-12-10 DIAGNOSIS — R41844 Frontal lobe and executive function deficit: Secondary | ICD-10-CM | POA: Diagnosis not present

## 2021-12-10 DIAGNOSIS — R29818 Other symptoms and signs involving the nervous system: Secondary | ICD-10-CM

## 2021-12-10 DIAGNOSIS — R41841 Cognitive communication deficit: Secondary | ICD-10-CM

## 2021-12-10 DIAGNOSIS — R41842 Visuospatial deficit: Secondary | ICD-10-CM

## 2021-12-10 NOTE — Therapy (Signed)
[facility] [address] [place], [place], [address] Phone: [phone]   Fax:  [phone]  Occupational Therapy Treatment  Patient Details  Name: Brandon Nielsen MRN: [id number] Date of Birth: 1956/09/07 Referring Provider (OT): Patrcia Dolly, MD   Encounter Date: 12/10/2021   OT End of Session - 12/10/21 0851     Visit Number 4    Number of Visits 13    Date for OT Re-Evaluation 02/26/22    Authorization Type Medicare (primary); BCBS (secondary)    Authorization Time Period VL: [place]    Progress Note Due on Visit 10    OT Start Time 0849    OT Stop Time 0929    OT Time Calculation (min) 40 min    Activity Tolerance Patient tolerated treatment well    Behavior During Therapy [facility] for tasks assessed/performed            Past Medical History:  Diagnosis Date   Chicken pox    as a child   Fuch's endothelial dystrophy    GERD (gastroesophageal reflux disease)    off meds - after loosing weight   H/O infectious mononucleosis    in college   Hepatitis     Past Surgical History:  Procedure Laterality Date   COLONOSCOPY     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 No/denies             OT Treatment - 12/10/21    IADLs: Communication Management OT practiced using voice activation on cell phone to complete tasks, e.g., making a phone call, open a text, check the weather. Increased success using digital  assistant from lock screen vs home screen. OT incorporated visual aid created in prior session for sequencing w/ pt demonstrating success following visual/written steps. OT also provided extended time for processing and repetition to facilitate learning. Activity graded up to have pt practice additional step: tapping icon to ensure digital assistant is "listening" before verbalizing request. Pt did well and was able to remember this step w/out cueing approx 50% of the time w/ initial breakdown of task provided as well as repetition. OT then facilitated practicing talk-to-text, which involves pressing a specific icon, speaking, and then pressing icon again to complete text. Pt required Mod A to recall 3rd step despite practice and additional verbal/visual cues.            OT Short Term Goals - 12/03/21 1622       OT SHORT TERM GOAL #1   Title Caregiver will verbalize understanding of at least 2 available resources related to provision of caregiver support (e.g., day centers, respite care, support groups, etc.)    Baseline Unable to assess at eval    Time 2    Period Weeks    Status On-going    Target Date 12/14/21  OT SHORT TERM GOAL #2   Title Pt and caregiver will report understanding of potentially beneficial AE or supports of daily living (e.g., dementia clock, tags for missing objects, medication prompts, etc.) to decrease caregiver strain    Baseline Unable to assess at eval    Time 3    Period Weeks    Status On-going    Target Date 12/21/21             OT Long Term Goals - 12/03/21 1622       OT LONG TERM GOAL #1   Title Pt will be able to complete UB/LB dressing w/ Mod I, incorporating compensatory strategies including AE prn, at least 50% of the time by d/c    Baseline Requires assist w/ dressing tasks    Time 6    Period Weeks    Status On-going    Target Date 01/11/22      OT LONG TERM GOAL #2   Title Pt will be able to complete 9-Hole Peg Test in 1 min or  less to demonstrate improved coordination and processing    Baseline 1 min, 3 sec w/ dominant UE    Time 6    Period Weeks    Status On-going    Target Date 01/11/22      OT LONG TERM GOAL #3   Title Pt will be able to unlock and complete 1 task on his phone w/ Mod I, using compensatory/adaptive strategies prn for safety    Baseline Required Min A to unlock phone and write a text message    Time 6    Period Weeks    Status On-going    Target Date 01/11/22      OT LONG TERM GOAL #4   Title Pt will be able to complete voice-activated calling using his phone in at least 2 attempt w/ Mod I    Baseline Increased difficulty w/ cell phone use    Time 6    Period Weeks    Status On-going    Target Date 01/11/22             Plan - 12/10/21 1508     Clinical Impression Statement OT continued to address independence w/ communication management, focusing on using his cell phone to contact family members. Pt continues to demonstrate difficulty w/ greater than 2-step direction, but success does improve w/ repetition and visual cueing. Pt practiced calling his son using learned strategies in order to ask what voice activation device his son had told him about a few days prior; OT confimed w/ pt that it was okay to discuss this w/ pt's son and he was agreeable. Pt's son was asking about a home smart speaker and OT will provide information next session about potential benefit of using this type of device at home.    OT Occupational Profile and History Detailed Assessment- Review of Records and additional review of physical, cognitive, psychosocial history related to current functional performance    Occupational performance deficits (Please refer to evaluation for details): ADL's;IADL's;Work;Leisure;Social Participation    Body Structure / Function / Physical Skills Vision;ADL;IADL;Decreased knowledge of use of DME    Cognitive Skills Memory;Orientation;Problem Solve;Safety Awareness;Sequencing     Psychosocial Skills Environmental  Adaptations;Interpersonal Interaction;Routines and Behaviors    Rehab Potential Fair    Clinical Decision Making Several treatment options, min-mod task modification necessary    Comorbidities Affecting Occupational Performance: None    Modification or Assistance to Complete Evaluation  Min-Moderate  modification of tasks or assist with assess necessary to complete eval    OT Frequency 2x / week    OT Duration 6 weeks    OT Treatment/Interventions Self-care/ADL training;DME and/or AE instruction;Therapeutic activities;Psychosocial skills training;Cognitive remediation/compensation;Visual/perceptual remediation/compensation;Therapeutic exercise;Functional Mobility Training;Patient/family education    Plan Address FM coordination; provide information on home smart speaker    Consulted and Agree with Plan of Care Patient            Patient will benefit from skilled therapeutic intervention in order to improve the following deficits and impairments:   Body Structure / Function / Physical Skills: Vision, ADL, IADL, Decreased knowledge of use of DME Cognitive Skills: Memory, Orientation, Problem Solve, Safety Awareness, Sequencing Psychosocial Skills: Environmental  Adaptations, Interpersonal Interaction, Routines and Behaviors   Visit Diagnosis: Frontal lobe and executive function deficit  Other symptoms and signs involving the nervous system  Visuospatial deficit  Cognitive communication deficit   Problem List Patient Active Problem List   Diagnosis Date Noted   Early onset Alzheimer's dementia without behavioral disturbance ([facility]) 01/13/2020   Routine health maintenance 11/08/2012   Obstructive sleep apnea 01/09/2008   Allergic rhinitis 01/09/2008   GASTROESOPHAGEAL REFLUX DISEASE 01/09/2008    Rosie Fate, MSOT, OTR/L  12/10/2021, 3:10 PM  [facility] [address] [place], [place],  [address] Phone: [phone]   Fax:  [phone]  Name: Ladarien Braam MRN: [id number] Date of Birth: 01/07/1956

## 2021-12-12 ENCOUNTER — Ambulatory Visit: Payer: Medicare Other | Attending: Neurology | Admitting: Occupational Therapy

## 2021-12-12 ENCOUNTER — Other Ambulatory Visit: Payer: Self-pay

## 2021-12-12 ENCOUNTER — Encounter: Payer: Self-pay | Admitting: Occupational Therapy

## 2021-12-12 DIAGNOSIS — R41841 Cognitive communication deficit: Secondary | ICD-10-CM

## 2021-12-12 DIAGNOSIS — R29818 Other symptoms and signs involving the nervous system: Secondary | ICD-10-CM

## 2021-12-12 DIAGNOSIS — R41842 Visuospatial deficit: Secondary | ICD-10-CM

## 2021-12-12 DIAGNOSIS — R41844 Frontal lobe and executive function deficit: Secondary | ICD-10-CM

## 2021-12-12 NOTE — Therapy (Signed)
[facility] [address] [place], [place], [address] Phone: [phone]   Fax:  [phone]  Occupational Therapy Treatment  Patient Details  Name: Brandon Nielsen MRN: [id number] Date of Birth: June 01, 1956 Referring Provider (OT): Patrcia Dolly, MD   Encounter Date: 12/12/2021   OT End of Session - 12/12/21 1016     Visit Number 5    Number of Visits 13    Date for OT Re-Evaluation 02/26/22    Authorization Type Medicare (primary); BCBS (secondary)    Authorization Time Period VL: [place]    Progress Note Due on Visit 10    OT Start Time 1012    OT Stop Time 1056    OT Time Calculation (min) 44 min    Activity Tolerance Patient tolerated treatment well    Behavior During Therapy WFL for tasks assessed/performed            Past Medical History:  Diagnosis Date   Chicken pox    as a child   Fuch's endothelial dystrophy    GERD (gastroesophageal reflux disease)    off meds - after loosing weight   H/O infectious mononucleosis    in college   Hepatitis     Past Surgical History:  Procedure Laterality Date   COLONOSCOPY     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 No/denies             OT Treatment - 12/12/21    ADLs: Home Maintenance Education provided regarding potential benefit of using a home smart speaker (e.g., [employer]'s Alexa or Stage manager), w/ OT including demonstration and pt-specific examples to facilitate understanding. OT also worked w/ pt  to develop a handout he is able to provide to family members/caregivers to help facilitate practice of this at home.    Pegboard Activity Picking up easy-grip pegs, rotating in-hand (L side dominant) and placing into large pegboard to address FMC/dexterity, sequencing, and attention to task. Pt required occasional verbal cues for sequencing of picking up pegs beside pegboard vs picking up pegs already positioned in pegboard and moving them to another spot. No difficulty observed w/ [facility] or dexterity, which indicates difficulty w/ "coordination" that pt reports when using his phone is likely due to processing and executive functioning limitations.    IADLs: Computer Attempted to open an email using familiar interface on the computer and type a given sentence to simulate activities pt reports completing at "work." Pt was unable to identify icon/text to open new email despite visual cues, demonstration, and extended time for processing. When attempting to type, pt would often lose track in the middle of a word and/or hit incorrect keys; poor self-monitoring and no self-correction observed.            OT Short Term Goals - 12/03/21 1622       OT SHORT TERM GOAL #1   Title Caregiver will verbalize understanding of  at least 2 available resources related to provision of caregiver support (e.g., day centers, respite care, support groups, etc.)    Baseline Unable to assess at eval    Time 2    Period Weeks    Status On-going    Target Date 12/14/21      OT SHORT TERM GOAL #2   Title Pt and caregiver will report understanding of potentially beneficial AE or supports of daily living (e.g., dementia clock, tags for missing objects, medication prompts, etc.) to decrease caregiver strain    Baseline Unable to assess at eval    Time 3    Period Weeks    Status On-going    Target Date 12/21/21             OT Long Term Goals - 12/03/21 1622       OT LONG TERM GOAL #1   Title Pt will be able to  complete UB/LB dressing w/ Mod I, incorporating compensatory strategies including AE prn, at least 50% of the time by d/c    Baseline Requires assist w/ dressing tasks    Time 6    Period Weeks    Status On-going    Target Date 01/11/22      OT LONG TERM GOAL #2   Title Pt will be able to complete 9-Hole Peg Test in 1 min or less to demonstrate improved coordination and processing    Baseline 1 min, 3 sec w/ dominant UE    Time 6    Period Weeks    Status On-going    Target Date 01/11/22      OT LONG TERM GOAL #3   Title Pt will be able to unlock and complete 1 task on his phone w/ Mod I, using compensatory/adaptive strategies prn for safety    Baseline Required Min A to unlock phone and write a text message    Time 6    Period Weeks    Status On-going    Target Date 01/11/22      OT LONG TERM GOAL #4   Title Pt will be able to complete voice-activated calling using his phone in at least 2 attempt w/ Mod I    Baseline Increased difficulty w/ cell phone use    Time 6    Period Weeks    Status On-going    Target Date 01/11/22             Plan - 12/12/21 1502     Clinical Impression Statement Building on discussion in previous session regarding potential use of home smart speaker, OT provided education and pt-specific examples of use w/ intention to increase sense of control and independence as well as quality of life. Pt has done well using digital voice assistant his phone and a home smart speaker may be even easier to use becuase it involves less sequencing and less distracting stimuli. OT also addressed use of the computer, focusing on typing on a standard keyboard due to pt's report that he is having difficulty w/ this at "work." Pt demonstrated significant difficulty w/ this; likely will not continue to address this in therapy due to limitations w/ generalizing use of his specific computer to novel computer and decreased benefit toward independence in functional tasks.    OT  Occupational Profile and History Problem Focused Assessment - Including review of records relating to presenting problem    Occupational performance deficits (Please refer to evaluation for details): ADL's;IADL's;Work;Leisure;Social Participation    Body  Structure / Function / Physical Skills Vision;ADL;IADL;Decreased knowledge of use of DME    Cognitive Skills Memory;Orientation;Problem Solve;Safety Awareness;Sequencing    Psychosocial Skills Environmental  Adaptations;Interpersonal Interaction;Routines and Behaviors    Rehab Potential Fair    Clinical Decision Making Several treatment options, min-mod task modification necessary    Comorbidities Affecting Occupational Performance: None    Modification or Assistance to Complete Evaluation  Min-Moderate modification of tasks or assist with assess necessary to complete eval    OT Frequency 2x / week    OT Duration 6 weeks    OT Treatment/Interventions Self-care/ADL training;DME and/or AE instruction;Therapeutic activities;Psychosocial skills training;Cognitive remediation/compensation;Visual/perceptual remediation/compensation;Therapeutic exercise;Functional Mobility Training;Patient/family education    Plan Review practice of home smart speaker; provide information on day centers and caregiver support; create "smart phrases" for texting    Consulted and Agree with Plan of Care Patient            Patient will benefit from skilled therapeutic intervention in order to improve the following deficits and impairments:   Body Structure / Function / Physical Skills: Vision, ADL, IADL, Decreased knowledge of use of DME Cognitive Skills: Memory, Orientation, Problem Solve, Safety Awareness, Sequencing Psychosocial Skills: Environmental  Adaptations, Interpersonal Interaction, Routines and Behaviors   Visit Diagnosis: Frontal lobe and executive function deficit  Other symptoms and signs involving the nervous system  Visuospatial  deficit  Cognitive communication deficit   Problem List Patient Active Problem List   Diagnosis Date Noted   Early onset Alzheimer's dementia without behavioral disturbance (HCC) 01/13/2020   Routine health maintenance 11/08/2012   Obstructive sleep apnea 01/09/2008   Allergic rhinitis 01/09/2008   GASTROESOPHAGEAL REFLUX DISEASE 01/09/2008    Rosie Fate, MSOT, OTR/L 12/12/2021, 3:44 PM  [facility] [address] [place], [place], [address] Phone: [phone]   Fax:  [phone]  Name: Brandon Nielsen MRN: [id number] Date of Birth: [birth year]

## 2021-12-15 ENCOUNTER — Encounter: Payer: Self-pay | Admitting: Occupational Therapy

## 2021-12-17 ENCOUNTER — Other Ambulatory Visit: Payer: Self-pay

## 2021-12-17 ENCOUNTER — Encounter: Payer: Self-pay | Admitting: Occupational Therapy

## 2021-12-17 ENCOUNTER — Ambulatory Visit: Payer: Medicare Other | Admitting: Occupational Therapy

## 2021-12-17 DIAGNOSIS — R41844 Frontal lobe and executive function deficit: Secondary | ICD-10-CM

## 2021-12-17 DIAGNOSIS — R41842 Visuospatial deficit: Secondary | ICD-10-CM

## 2021-12-17 DIAGNOSIS — R29818 Other symptoms and signs involving the nervous system: Secondary | ICD-10-CM

## 2021-12-17 DIAGNOSIS — R41841 Cognitive communication deficit: Secondary | ICD-10-CM

## 2021-12-17 NOTE — Therapy (Signed)
[facility] ?[address] ?[place], [place], [address] ?Phone: [phone]   Fax:  [phone] ? ?Occupational Therapy Treatment ? ?Patient Details  ?Name: Brandon Nielsen ?MRN: [id number] ?Date of Birth: [birth year] ?Referring Provider (OT): Patrcia Dolly, MD ? ? ?Encounter Date: 12/17/2021 ? ? OT End of Session - 12/17/21 0848   ? ? Visit Number 6   ? Number of Visits 13   ? Date for OT Re-Evaluation 02/26/22   ? Authorization Type Medicare (primary); BCBS (secondary)   ? Authorization Time Period VL: [place]   ? Progress Note Due on Visit 10   ? OT Start Time [phone]   ? OT Stop Time 0925   ? OT Time Calculation (min) 42 min   ? Activity Tolerance Patient tolerated treatment well   ? Behavior During Therapy [facility] for tasks assessed/performed   ? ?  ?  ? ?  ? ?Past Medical History:  ?Diagnosis Date  ? Chicken pox   ? as a child  ? Fuch's endothelial dystrophy   ? GERD (gastroesophageal reflux disease)   ? off meds - after loosing weight  ? H/O infectious mononucleosis   ? in college  ? Hepatitis   ? ? ?Past Surgical History:  ?Procedure Laterality Date  ? COLONOSCOPY    ? CORNEAL TRANSPLANT  2014  ? Bil eyes  ? NASAL SEPTUM SURGERY    ? at 73 yrs of age  ? ? ?There were no vitals filed for this visit. ? ? Subjective Assessment - 12/17/21 0845   ? ? Subjective  Pt states they have ordered an [employer] HomePod and will work with his son to get it set up   ? Patient is accompanied by: Family member   Wife Brandon Nielsen)  ? 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 Patient Stated Goals "To learn how to function better"   ? Currently in Pain? No/denies   ? ?  ?  ? ?  ? ? OT Treatment - 12/17/21  ? ? IADLs: Phone Management OT practiced navigation of cellphone to "unlock" and find specified app, open up calendar to find appointments, and closing incidentally opened apps. OT incorporated significant repetition w/ each task,  errorless learning, and allowed extended time for processing w/ positive results. Pt able to sequence through unlocking his phone w/ verbal or tactile cue to "swipe up" and find identified app w/ Mod I more than 50% of the time. Increased difficulty w/ unlocking phone to find and search calendar, but success improved w/ repetition. Most difficulty observed when attempting to close unwanted apps; OT facilitated this by opening an app, locking pt's phone, and then facilitating blocked practice of pt unlocking his phone, closing out of whatever app was open, and then selecting text message app instead. Pt frequently was distracted by open app, forgetting the requested task of closing that app and finding text messages despite step-by-step cues; success slightly improved w/ repetition.  ? ?  ?  ? ?  ? ? OT Short Term Goals - 12/03/21 1622   ? ?  ? OT SHORT TERM GOAL #1  ? Title Caregiver will verbalize understanding of at least 2 available resources related to provision of caregiver support (e.g., day centers, respite care, support groups, etc.)   ? Baseline Unable to assess at eval   ? Time 2   ? Period Weeks   ? Status On-going   ? Target  Date 12/14/21   ?  ? OT SHORT TERM GOAL #2  ? Title Pt and caregiver will report understanding of potentially beneficial AE or supports of daily living (e.g., dementia clock, tags for missing objects, medication prompts, etc.) to decrease caregiver strain   ? Baseline Unable to assess at eval   ? Time 3   ? Period Weeks   ? Status On-going   ? Target Date 12/21/21   ? ?  ?  ? ?  ? ? OT Long Term Goals - 12/03/21 1622   ? ?  ? OT LONG TERM GOAL #1  ? Title Pt will be able to complete UB/LB dressing w/ Mod I, incorporating compensatory strategies including AE prn, at least 50% of the time by d/c   ? Baseline Requires assist w/ dressing tasks   ? Time 6   ? Period Weeks   ? Status On-going   ? Target Date 01/11/22   ?  ? OT LONG TERM GOAL #2  ? Title Pt will be able to complete 9-Hole  Peg Test in 1 min or less to demonstrate improved coordination and processing   ? Baseline 1 min, 3 sec w/ dominant UE   ? Time 6   ? Period Weeks   ? Status On-going   ? Target Date 01/11/22   ?  ? OT LONG TERM GOAL #3  ? Title Pt will be able to unlock and complete 1 task on his phone w/ Mod I, using compensatory/adaptive strategies prn for safety   ? Baseline Required Min A to unlock phone and write a text message   ? Time 6   ? Period Weeks   ? Status On-going   ? Target Date 01/11/22   ?  ? OT LONG TERM GOAL #4  ? Title Pt will be able to complete voice-activated calling using his phone in at least 2 attempt w/ Mod I   ? Baseline Increased difficulty w/ cell phone use   ? Time 6   ? Period Weeks   ? Status On-going   ? Target Date 01/11/22   ? ?  ?  ? ?  ? ? Plan - 12/17/21 0848   ? ? Clinical Impression Statement OT continued to address independence w/ simple cell phone management, focusing this session on unlocking his phone consistently and finding/opening specified apps. OT incorporated errorless learning and blocked practice into task-oriented training to facilitate success w/ positive results. Independence improved w/ repetition and OT was able to decrease number/freqeuncy of cues.   ? OT Occupational Profile and History Problem Focused Assessment - Including review of records relating to presenting problem   ? Occupational performance deficits (Please refer to evaluation for details): ADL's;IADL's;Work;Leisure;Social Participation   ? Body Structure / Function / Physical Skills Vision;ADL;IADL;Decreased knowledge of use of DME   ? Cognitive Skills Memory;Orientation;Problem Solve;Safety Awareness;Sequencing   ? Psychosocial Skills Environmental  Adaptations;Interpersonal Interaction;Routines and Behaviors   ? Rehab Potential Fair   ? Clinical Decision Making Several treatment options, min-mod task modification necessary   ? Comorbidities Affecting Occupational Performance: None   ? Modification or  Assistance to Complete Evaluation  Min-Moderate modification of tasks or assist with assess necessary to complete eval   ? OT Frequency 2x / week   ? OT Duration 6 weeks   ? OT Treatment/Interventions Self-care/ADL training;DME and/or AE instruction;Therapeutic activities;Psychosocial skills training;Cognitive remediation/compensation;Visual/perceptual remediation/compensation;Therapeutic exercise;Functional Mobility Training;Patient/family education   ? Plan Review unlocking phone, closing open apps, and  finding calendar; provide information on day centers and caregiver support   ? Consulted and Agree with Plan of Care Patient   ? ?  ?  ? ?  ? ?Patient will benefit from skilled therapeutic intervention in order to improve the following deficits and impairments:   ?Body Structure / Function / Physical Skills: Vision, ADL, IADL, Decreased knowledge of use of DME ?Cognitive Skills: Memory, Orientation, Problem Solve, Safety Awareness, Sequencing ?Psychosocial Skills: Environmental  Adaptations, Interpersonal Interaction, Routines and Behaviors ? ? ?Visit Diagnosis: ?Frontal lobe and executive function deficit ? ?Other symptoms and signs involving the nervous system ? ?Visuospatial deficit ? ?Cognitive communication deficit ? ? ?Problem List ?Patient Active Problem List  ? Diagnosis Date Noted  ? Early onset Alzheimer's dementia without behavioral disturbance (HCC) 01/13/2020  ? Routine health maintenance 11/08/2012  ? Obstructive sleep apnea 01/09/2008  ? Allergic rhinitis 01/09/2008  ? GASTROESOPHAGEAL REFLUX DISEASE 01/09/2008  ? ? ?Rosie Fate, MSOT, OTR/L ?12/17/2021, 3:54 PM ? ?[facility] ?[address] ?[place], [place], [address] ?Phone: [phone]   Fax:  [phone] ? ?Name: Toryn Bushell Tomich ?MRN: [id number] ?Date of Birth: [birth year] ?

## 2021-12-19 ENCOUNTER — Encounter: Payer: Self-pay | Admitting: Occupational Therapy

## 2021-12-19 ENCOUNTER — Ambulatory Visit: Payer: Medicare Other | Admitting: Occupational Therapy

## 2021-12-19 ENCOUNTER — Other Ambulatory Visit: Payer: Self-pay

## 2021-12-19 DIAGNOSIS — R41841 Cognitive communication deficit: Secondary | ICD-10-CM

## 2021-12-19 DIAGNOSIS — R41844 Frontal lobe and executive function deficit: Secondary | ICD-10-CM

## 2021-12-19 DIAGNOSIS — R29818 Other symptoms and signs involving the nervous system: Secondary | ICD-10-CM

## 2021-12-19 DIAGNOSIS — R41842 Visuospatial deficit: Secondary | ICD-10-CM

## 2021-12-19 NOTE — Therapy (Signed)
[facility] [address], [place], [place], [address] Phone: [phone]   Fax:  [phone]  Occupational Therapy Treatment  Patient Details  Name: Brandon Nielsen MRN: [id number] Date of Birth: January 18, 1956 Referring Provider (OT): Ellouise Newer, MD   Encounter Date: 12/19/2021   OT End of Session - 12/19/21 1418     Visit Number 7    Number of Visits 13    Date for OT Re-Evaluation 02/26/22    Authorization Type Medicare (primary); BCBS (secondary)    Authorization Time Period VL: [place]    Progress Note Due on Visit 10    OT Start Time 0940   pt arrival time   OT Stop Time 1015    OT Time Calculation (min) 35 min    Activity Tolerance Patient tolerated treatment well    Behavior During Therapy [facility] for tasks assessed/performed            Past Medical History:  Diagnosis Date   Chicken pox    as a child   Fuch's endothelial dystrophy    GERD (gastroesophageal reflux disease)    off meds - after loosing weight   H/O infectious mononucleosis    in college   Hepatitis     Past Surgical History:  Procedure Laterality Date   COLONOSCOPY     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 No/denies             OT Assessment - 12/19/21    IADLs: Phone Management OT practiced navigation of cell phone to unlock and then select specified app, and to use voice activated digital assistant to open apps or complete tasks. OT incorporated blocked practice and errorless  learning to facilitate understanding and success w/ each task. Pt able to unlock phone and select 'photos' app w/ SPV about 70% of the time w/ OT providing extended time, correction of errors, and extended time. Completed additional set of same task w/ OT grading task up to include additional step of closing app after use. Decreased success; required mod-max verbal cues and correction of errors for success. Completed use of digital assistant from lock screen to check weather; OT also used visual aid created in a prior session, including appropriate modifications, for incorporation of practicable steps w/ positive results. Able to complete w/ Mod I 50% of the time after repetition. Also used Chartered loss adjuster from home screen to open specified apps (photos, [employer] news, weather); visual aid incorporated again w/ OT providing extended time for processing, correction of errors, and verbal cues prn.            OT Short Term Goals - 12/03/21 1622       OT SHORT TERM GOAL #1   Title Caregiver will verbalize understanding of at least 2 available resources related to provision of caregiver support (e.g., day centers, respite care, support groups, etc.)  Baseline Unable to assess at eval    Time 2    Period Weeks    Status On-going    Target Date 12/14/21      OT SHORT TERM GOAL #2   Title Pt and caregiver will report understanding of potentially beneficial AE or supports of daily living (e.g., dementia clock, tags for missing objects, medication prompts, etc.) to decrease caregiver strain    Baseline Unable to assess at eval    Time 3    Period Weeks    Status On-going    Target Date 12/21/21             OT Long Term Goals - 12/19/21 1458       OT LONG TERM GOAL #1   Title Pt will be able to complete UB/LB dressing w/ Mod I, incorporating compensatory strategies including AE prn, at least 50% of the time by d/c    Baseline Requires assist w/ dressing tasks    Time 6    Period Weeks     Status On-going    Target Date 01/11/22      OT LONG TERM GOAL #2   Title Pt will be able to complete 9-Hole Peg Test in 1 min or less to demonstrate improved coordination and processing    Baseline 1 min, 3 sec w/ dominant UE    Time 6    Period Weeks    Status On-going    Target Date 01/11/22      OT LONG TERM GOAL #3   Title Pt will be able to unlock and complete 1 task on his phone w/ Mod I, using compensatory/adaptive strategies prn for safety    Baseline Required Min A to unlock phone and write a text message    Time 6    Period Weeks    Status Partially Met   12/19/21 - able to complete practiced phone tasks w/ Mod I inconsistently   Target Date 01/11/22      OT LONG TERM GOAL #4   Title Pt will be able to complete voice-activated calling using his phone in at least 2 attempt w/ Mod I    Baseline Increased difficulty w/ cell phone use    Time 6    Period Weeks    Status Partially Met   12/19/21 - able to complete w/ SPV inconsistently   Target Date 01/11/22             Plan - 12/19/21 1423     Clinical Impression Statement OT continued to address independence w/ simple cell phone management, focusing this session on reiterating learning of unlocking his phone and opening specified app and using voice-activated digital assistant to complete simple, one-step tasks. OT incorporated errorless learning, blocked practice, and knowledge of results into task-oriented training to facilitate success based on most EBP for pt's w/ NCD. Success and level of independence improved w/ repetition and OT was able to decrease number/freqeuncy of cues. Pt continued to be distracted by opened apps this session, but was easily redirected. Updated visual aid provided to pt at conclusion of session to facilitate carryover of practicable steps at home.    OT Occupational Profile and History Problem Focused Assessment - Including review of records relating to presenting problem    Occupational  performance deficits (Please refer to evaluation for details): ADL's;IADL's;Work;Leisure;Social Participation    Body Structure / Function / Physical Skills Vision;ADL;IADL;Decreased knowledge of use of DME    Cognitive Skills Memory;Orientation;Problem Solve;Safety Awareness;Sequencing  Psychosocial Skills Environmental  Adaptations;Interpersonal Interaction;Routines and Behaviors    Rehab Potential Fair    Clinical Decision Making Several treatment options, min-mod task modification necessary    Comorbidities Affecting Occupational Performance: None    Modification or Assistance to Complete Evaluation  Min-Moderate modification of tasks or assist with assess necessary to complete eval    OT Frequency 2x / week    OT Duration 6 weeks    OT Treatment/Interventions Self-care/ADL training;DME and/or AE instruction;Therapeutic activities;Psychosocial skills training;Cognitive remediation/compensation;Visual/perceptual remediation/compensation;Therapeutic exercise;Functional Mobility Training;Patient/family education    Plan Provide information on day centers and caregiver support    Consulted and Agree with Plan of Care Patient            Patient will benefit from skilled therapeutic intervention in order to improve the following deficits and impairments:   Body Structure / Function / Physical Skills: Vision, ADL, IADL, Decreased knowledge of use of DME Cognitive Skills: Memory, Orientation, Problem Solve, Safety Awareness, Sequencing Psychosocial Skills: Environmental  Adaptations, Interpersonal Interaction, Routines and Behaviors   Visit Diagnosis: Frontal lobe and executive function deficit  Other symptoms and signs involving the nervous system  Visuospatial deficit  Cognitive communication deficit   Problem List Patient Active Problem List   Diagnosis Date Noted   Early onset Alzheimer's dementia without behavioral disturbance ([place]) 01/13/2020   Routine health maintenance  11/08/2012   Obstructive sleep apnea 01/09/2008   Allergic rhinitis 01/09/2008   GASTROESOPHAGEAL REFLUX DISEASE 01/09/2008    Kathrine Cords, MSOT, OTR/L  12/19/2021, 2:24 PM  [facility] [address], [place], [place], [address] Phone: [phone]   Fax:  [phone]  Name: Urijah Arko MRN: [id number] Date of Birth: March 14, 1956

## 2021-12-24 ENCOUNTER — Other Ambulatory Visit: Payer: Self-pay

## 2021-12-24 ENCOUNTER — Ambulatory Visit: Payer: Medicare Other | Admitting: Occupational Therapy

## 2021-12-24 ENCOUNTER — Encounter: Payer: Self-pay | Admitting: Occupational Therapy

## 2021-12-24 DIAGNOSIS — R41841 Cognitive communication deficit: Secondary | ICD-10-CM

## 2021-12-24 DIAGNOSIS — R41844 Frontal lobe and executive function deficit: Secondary | ICD-10-CM

## 2021-12-24 DIAGNOSIS — R29818 Other symptoms and signs involving the nervous system: Secondary | ICD-10-CM

## 2021-12-24 DIAGNOSIS — R41842 Visuospatial deficit: Secondary | ICD-10-CM

## 2021-12-24 NOTE — Therapy (Signed)
[facility] ?[place], [place], [address] ?Phone: [phone]   Fax:  [phone] ? ?Occupational Therapy Treatment ? ?Patient Details  ?Name: Brandon Nielsen ?MRN: [id number] ?Date of Birth: [birth year] ?Referring Provider (OT): Ellouise Newer, MD ? ? ?Encounter Date: 12/24/2021 ? ? OT End of Session - 12/24/21 0934   ? ? Visit Number 8   ? Number of Visits 13   ? Date for OT Re-Evaluation 02/26/22   ? Authorization Type Medicare (primary); BCBS (secondary)   ? Authorization Time Period VL: [place]   ? Progress Note Due on Visit 10   ? OT Start Time 0930   ? OT Stop Time 1015   ? OT Time Calculation (min) 45 min   ? Activity Tolerance Patient tolerated treatment well   ? Behavior During Therapy [facility] for tasks assessed/performed   ? ?  ?  ? ?  ? ?Past Medical History:  ?Diagnosis Date  ? Chicken pox   ? as a child  ? Fuch's endothelial dystrophy   ? GERD (gastroesophageal reflux disease)   ? off meds - after loosing weight  ? H/O infectious mononucleosis   ? in college  ? Hepatitis   ? ? ?Past Surgical History:  ?Procedure Laterality Date  ? COLONOSCOPY    ? CORNEAL TRANSPLANT  2014  ? Bil eyes  ? NASAL SEPTUM SURGERY    ? at 1 yrs of age  ? ? ?There were no vitals filed for this visit. ? ? Subjective Assessment - 12/24/21 0932   ? ? Subjective  "I have an Apple HomePod that functions now; it's a lot of fun"   ? Patient is accompanied by: Family member   [name])  ? 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 Patient Stated Goals "To learn how to function better"   ? Currently in Pain? No/denies   ? ?  ?  ? ?  ? ? OT Treatment - 12/24/21  ? ? IADLs: Phone Management  Practiced 2-step task w/ cell phone: unlocking phone and asking voice-activated digital assistant "do I have any appointments today?" Completed 5x w/ SPV, requiring occasional verbal/visual cues to remember to reference and sequence  practicable steps outlined visually. ?  ? IADLs: Laundry Task Loss adjuster, chartered; required Max A to complete. OT incorporated picture-based visual aid w/out success, and used HOH A during practicable steps, verbal cues, extended time for processing, and repetition. Pt consistently demonstrated difficulty following 1-step direction during activity ?  ? Condition-Specific Education Pt also initiated discussion this session regarding progression of his disease and current emotional impact related to this. OT provided support and relevant condition-specific education as best as able, as well as review of current purpose of OT in OP session; pt verbalized understanding  ? ?  ?  ? ?  ? ? OT Short Term Goals - 12/03/21 1622   ? ?  ? OT SHORT TERM GOAL #1  ? Title Caregiver will verbalize understanding of at least 2 available resources related to provision of caregiver support (e.g., day centers, respite care, support groups, etc.)   ? Baseline Unable to assess at eval   ? Time 2   ? Period Weeks   ? Status On-going   ? Target Date 12/14/21   ?  ? OT SHORT TERM GOAL #2  ? Title Pt and caregiver will report understanding of potentially beneficial AE or supports of  daily living (e.g., dementia clock, tags for missing objects, medication prompts, etc.) to decrease caregiver strain   ? Baseline Unable to assess at eval   ? Time 3   ? Period Weeks   ? Status On-going   ? Target Date 12/21/21   ? ?  ?  ? ?  ? ? OT Long Term Goals - 12/19/21 1458   ? ?  ? OT LONG TERM GOAL #1  ? Title Pt will be able to complete UB/LB dressing w/ Mod I, incorporating compensatory strategies including AE prn, at least 50% of the time by d/c   ? Baseline Requires assist w/ dressing tasks   ? Time 6   ? Period Weeks   ? Status On-going   ? Target Date 01/11/22   ?  ? OT LONG TERM GOAL #2  ? Title Pt will be able to complete 9-Hole Peg Test in 1 min or less to demonstrate improved coordination and processing   ? Baseline 1 min, 3 sec w/  dominant UE   ? Time 6   ? Period Weeks   ? Status On-going   ? Target Date 01/11/22   ?  ? OT LONG TERM GOAL #3  ? Title Pt will be able to unlock and complete 1 task on his phone w/ Mod I, using compensatory/adaptive strategies prn for safety   ? Baseline Required Min A to unlock phone and write a text message   ? Time 6   ? Period Weeks   ? Status Partially Met   12/19/21 - able to complete practiced phone tasks w/ Mod I inconsistently  ? Target Date 01/11/22   ?  ? OT LONG TERM GOAL #4  ? Title Pt will be able to complete voice-activated calling using his phone in at least 2 attempt w/ Mod I   ? Baseline Increased difficulty w/ cell phone use   ? Time 6   ? Period Weeks   ? Status Partially Met   12/19/21 - able to complete w/ SPV inconsistently  ? Target Date 01/11/22   ? ?  ?  ? ?  ? ? Plan - 12/24/21 0935   ? ? Clinical Impression Statement Pt demonstrates continued improvement w/ carryover of learned strategies for phone management this session, requiring decreased assistance for multi-step sequencing using cell phone digital assistant to complete tasks. OT then attempted less practiced activity to address increased independence w/ functional tasks and pt demonstrated significant difficulty w/ sequencing, visual cognition, and self-monitoring/self-correcting. Visual aid attempted to be incorporated w/out significant results. Will trial similar task w/ fewer steps next session to assess benefit of various potential compensatory/adaptive strategies.   ? OT Occupational Profile and History Problem Focused Assessment - Including review of records relating to presenting problem   ? Occupational performance deficits (Please refer to evaluation for details): ADL's;IADL's;Work;Leisure;Social Participation   ? Body Structure / Function / Physical Skills Vision;ADL;IADL;Decreased knowledge of use of DME   ? Cognitive Skills Memory;Orientation;Problem Solve;Safety Awareness;Sequencing   ? Psychosocial Skills Environmental   Adaptations;Interpersonal Interaction;Routines and Behaviors   ? Rehab Potential Fair   ? Clinical Decision Making Several treatment options, min-mod task modification necessary   ? Comorbidities Affecting Occupational Performance: None   ? Modification or Assistance to Complete Evaluation  Min-Moderate modification of tasks or assist with assess necessary to complete eval   ? OT Frequency 2x / week   ? OT Duration 6 weeks   ? OT Treatment/Interventions Self-care/ADL training;DME  and/or AE instruction;Therapeutic activities;Psychosocial skills training;Cognitive remediation/compensation;Visual/perceptual remediation/compensation;Therapeutic exercise;Functional Mobility Training;Patient/family education   ? Plan Provide information on day centers and caregiver support; practice plugging in cell phone charger   ? Consulted and Agree with Plan of Care Patient   ? ?  ?  ? ?  ? ?Patient will benefit from skilled therapeutic intervention in order to improve the following deficits and impairments:   ?Body Structure / Function / Physical Skills: Vision, ADL, IADL, Decreased knowledge of use of DME ?Cognitive Skills: Memory, Orientation, Problem Solve, Safety Awareness, Sequencing ?Psychosocial Skills: Environmental  Adaptations, Interpersonal Interaction, Routines and Behaviors ? ? ?Visit Diagnosis: ?Frontal lobe and executive function deficit ? ?Other symptoms and signs involving the nervous system ? ?Visuospatial deficit ? ?Cognitive communication deficit ? ? ?Problem List ?Patient Active Problem List  ? Diagnosis Date Noted  ? Early onset Alzheimer's dementia without behavioral disturbance ([place]) 01/13/2020  ? Routine health maintenance 11/08/2012  ? Obstructive sleep apnea 01/09/2008  ? Allergic rhinitis 01/09/2008  ? GASTROESOPHAGEAL REFLUX DISEASE 01/09/2008  ? ? ?Kathrine Cords, MSOT, OTR/L  ?12/24/2021, 9:35 AM ? ?[facility] ?[place], [place],  [address] ?Phone: [phone]   Fax:  [phone] ? ?Name: Windle Huebert Taplin ?MRN: [id number] ?Date of Birth: 09-14-1956 ?

## 2021-12-26 ENCOUNTER — Other Ambulatory Visit: Payer: Self-pay

## 2021-12-26 ENCOUNTER — Encounter: Payer: Self-pay | Admitting: Occupational Therapy

## 2021-12-26 ENCOUNTER — Ambulatory Visit: Payer: Medicare Other | Admitting: Occupational Therapy

## 2021-12-26 DIAGNOSIS — R41844 Frontal lobe and executive function deficit: Secondary | ICD-10-CM | POA: Diagnosis not present

## 2021-12-27 NOTE — Therapy (Signed)
[facility] ?[address], [address] ?[place], [place], [address] ?Phone: [phone]   Fax:  [phone] ? ?Occupational Therapy Treatment ? ?Patient Details  ?Name: Brandon Nielsen ?MRN: [id number] ?Date of Birth: [birth year] ?Referring Provider (OT): Ellouise Newer, MD ? ? ?Encounter Date: 12/26/2021 ? ? OT End of Session - 12/26/21 0949   ? ? Visit Number 9   ? Number of Visits 13   ? Date for OT Re-Evaluation 02/26/22   ? Authorization Type Medicare (primary); BCBS (secondary)   ? Authorization Time Period VL: [place]   ? Progress Note Due on Visit 10   ? OT Start Time [phone]   ? OT Stop Time 1022   ? OT Time Calculation (min) 40 min   ? Activity Tolerance Patient tolerated treatment well   ? Behavior During Therapy [facility] for tasks assessed/performed   ? ?  ?  ? ?  ? ?Past Medical History:  ?Diagnosis Date  ? Chicken pox   ? as a child  ? Fuch's endothelial dystrophy   ? GERD (gastroesophageal reflux disease)   ? off meds - after loosing weight  ? H/O infectious mononucleosis   ? in college  ? Hepatitis   ? ? ?Past Surgical History:  ?Procedure Laterality Date  ? COLONOSCOPY    ? CORNEAL TRANSPLANT  2014  ? Bil eyes  ? NASAL SEPTUM SURGERY    ? at 74 yrs of age  ? ? ?There were no vitals filed for this visit. ? ? Subjective Assessment - 12/26/21 0943   ? ? Subjective  "It hits me in waves"   ? Patient is accompanied by: Family member   [name])  ? 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 Patient Stated Goals "To learn how to function better"   ? Currently in Pain? No/denies   ? ?  ?  ? ?  ? ? OT Education - 12/26/21 1100   ? ? Education Details Continued condition-specific education, particularly related to Alzheimer's Association 24/7 helpline, local programs/support/resources, support groups, and early-stage social engagement programs; corresponding printed handout via Alzheimer's Association website provided to pt    ? Person(s) Educated Patient;Spouse   ? Methods Explanation;Handout   ? Comprehension Verbalized understanding   ? ?  ?  ? ?  ? ? OT Treatment - 12/26/21  ? ? IADLs: Computer Management 2-step task w/ Industrial/product designer: plugging power cord into wall outlet and then plugging adapter into computer port. Completed 3x w/ Min A, requiring initial demonstration, multimodal cues, and extended time for completion. ? ?Multi-step tasks on computer: identifying search bar in Internet browser, typing first few letters of chosen topic to begin search, and selecting appropriate populated smart link. OT graded activity up to include additional step of identifying and clicking on website link after completing search. Repeated sequence multiple times w/ Min A, requiring extended time for processing, consistent verbal and visual/gestural cues, and forward chaining for success.  ? ?  ?  ? ?  ? ? OT Short Term Goals - 12/03/21 1622   ? ?  ? OT SHORT TERM GOAL #1  ? Title Caregiver will verbalize understanding of at least 2 available resources related to provision of caregiver support (e.g., day centers, respite care, support groups, etc.)   ? Baseline Unable to assess at eval   ? Time 2   ? Period Weeks   ? Status On-going   ?  Target Date 12/14/21   ?  ? OT SHORT TERM GOAL #2  ? Title Pt and caregiver will report understanding of potentially beneficial AE or supports of daily living (e.g., dementia clock, tags for missing objects, medication prompts, etc.) to decrease caregiver strain   ? Baseline Unable to assess at eval   ? Time 3   ? Period Weeks   ? Status On-going   ? Target Date 12/21/21   ? ?  ?  ? ?  ? ? OT Long Term Goals - 12/19/21 1458   ? ?  ? OT LONG TERM GOAL #1  ? Title Pt will be able to complete UB/LB dressing w/ Mod I, incorporating compensatory strategies including AE prn, at least 50% of the time by d/c   ? Baseline Requires assist w/ dressing tasks   ? Time 6   ? Period Weeks   ? Status On-going   ? Target Date  01/11/22   ?  ? OT LONG TERM GOAL #2  ? Title Pt will be able to complete 9-Hole Peg Test in 1 min or less to demonstrate improved coordination and processing   ? Baseline 1 min, 3 sec w/ dominant UE   ? Time 6   ? Period Weeks   ? Status On-going   ? Target Date 01/11/22   ?  ? OT LONG TERM GOAL #3  ? Title Pt will be able to unlock and complete 1 task on his phone w/ Mod I, using compensatory/adaptive strategies prn for safety   ? Baseline Required Min A to unlock phone and write a text message   ? Time 6   ? Period Weeks   ? Status Partially Met   12/19/21 - able to complete practiced phone tasks w/ Mod I inconsistently  ? Target Date 01/11/22   ?  ? OT LONG TERM GOAL #4  ? Title Pt will be able to complete voice-activated calling using his phone in at least 2 attempt w/ Mod I   ? Baseline Increased difficulty w/ cell phone use   ? Time 6   ? Period Weeks   ? Status Partially Met   12/19/21 - able to complete w/ SPV inconsistently  ? Target Date 01/11/22   ? ?  ?  ? ?  ? ? Plan - 12/26/21 1004   ? ? Clinical Impression Statement Pt continues to benefit from OT incorporating errorless learning, repetition, and knowledge of results into task-oriented training to facilitate success w/ various functional IADLs based on most EBP for pt's w/ NCD. Level of assistance required during functional tasks decreased w/ repetition of activity and OT was able to grade up activity prn w/out negative results. Increased difficulty w/ computer tasks this session compared to tasks completed using cell phone likely due to increased number of steps involved, and may also be at least partically due to novelty of Naval architect. Carryover of strategies today will likely be limited to Min A at home due to caregiver not being present for education.   ? OT Occupational Profile and History Problem Focused Assessment - Including review of records relating to presenting problem   ? Occupational performance  deficits (Please refer to evaluation for details): ADL's;IADL's;Work;Leisure;Social Participation   ? Body Structure / Function / Physical Skills Vision;ADL;IADL;Decreased knowledge of use of DME   ? Cognitive Skills Memory;Orientation;Problem Solve;Safety Awareness;Sequencing   ? Psychosocial Skills Environmental  Adaptations;Interpersonal Interaction;Routines and Behaviors   ?  Rehab Potential Fair   ? Clinical Decision Making Several treatment options, min-mod task modification necessary   ? Comorbidities Affecting Occupational Performance: None   ? Modification or Assistance to Complete Evaluation  Min-Moderate modification of tasks or assist with assess necessary to complete eval   ? OT Frequency 2x / week   ? OT Duration 6 weeks   ? OT Treatment/Interventions Self-care/ADL training;DME and/or AE instruction;Therapeutic activities;Psychosocial skills training;Cognitive remediation/compensation;Visual/perceptual remediation/compensation;Therapeutic exercise;Functional Mobility Training;Patient/family education   ? Plan complete progress note; develop visual aid for plugging in cell phone and/or computer; card game to address executive functioning and FMC/dexterity   ? Consulted and Agree with Plan of Care Patient   ? ?  ?  ? ?  ? ?Patient will benefit from skilled therapeutic intervention in order to improve the following deficits and impairments:   ?Body Structure / Function / Physical Skills: Vision, ADL, IADL, Decreased knowledge of use of DME ?Cognitive Skills: Memory, Orientation, Problem Solve, Safety Awareness, Sequencing ?Psychosocial Skills: Environmental  Adaptations, Interpersonal Interaction, Routines and Behaviors ? ? ?Visit Diagnosis: ?Frontal lobe and executive function deficit ? ?Other symptoms and signs involving the nervous system ? ?Visuospatial deficit ? ?Cognitive communication deficit ? ? ?Problem List ?Patient Active Problem List  ? Diagnosis Date Noted  ? Early onset Alzheimer's dementia  without behavioral disturbance ([place]) 01/13/2020  ? Routine health maintenance 11/08/2012  ? Obstructive sleep apnea 01/09/2008  ? Allergic rhinitis 01/09/2008  ? GASTROESOPHAGEAL REFLUX DISEASE 01/09/2008

## 2021-12-31 ENCOUNTER — Encounter: Payer: Self-pay | Admitting: Occupational Therapy

## 2021-12-31 ENCOUNTER — Ambulatory Visit: Payer: Medicare Other | Admitting: Occupational Therapy

## 2021-12-31 ENCOUNTER — Other Ambulatory Visit: Payer: Self-pay

## 2021-12-31 DIAGNOSIS — R29818 Other symptoms and signs involving the nervous system: Secondary | ICD-10-CM

## 2021-12-31 DIAGNOSIS — R41844 Frontal lobe and executive function deficit: Secondary | ICD-10-CM | POA: Diagnosis not present

## 2021-12-31 DIAGNOSIS — R41841 Cognitive communication deficit: Secondary | ICD-10-CM

## 2021-12-31 DIAGNOSIS — R41842 Visuospatial deficit: Secondary | ICD-10-CM

## 2022-01-01 NOTE — Therapy (Signed)
[facility] [address] [place], [place], [address] Phone: [phone]   Fax:  [phone]  Occupational Therapy Treatment & Progress Note  Patient Details  Name: Brandon Nielsen MRN: [id number] Date of Birth: 1956-01-04 Referring Provider (OT): Brandon Dolly, MD   Encounter Date: 12/31/2021   OT End of Session - 12/31/21 0935     Visit Number 10    Number of Visits 13    Date for OT Re-Evaluation 02/26/22    Authorization Type Medicare (primary); BCBS (secondary)    Authorization Time Period VL: [place]    Progress Note Due on Visit 10    OT Start Time 0932    OT Stop Time 1014    OT Time Calculation (min) 42 min    Activity Tolerance Patient tolerated treatment well    Behavior During Therapy [facility] for tasks assessed/performed            Occupational Therapy Progress Note  Dates of Reporting Period: 11/28/21 to 12/31/21  Objective Reports of Subjective Statement: Pt demonstrates improving independence, occasionally requiring verbal cues for problem-solving when using voice-activated digital assistant on cell phone  Objective Measurements: Levels of independence, executive functioning, processing, and coordination  Goal Update: Pt is working toward STGs (difficult to assess due to caregiver not being present during sessions); 3/4 LTGs met  Plan: Continue to address safety, provide condition-specific education to pt and caregiver(s), and maintain independence as much as able via compensatory strategies, AE, and various cues at home  Reason Skilled Services are Required: Pt demonstrates significant difficulty w/ higher level visual perception (visual memory, visual cognition), attention, problem-solving, and executive functioning which all impact participation and independence w/ ADLs, as well as contribute to caregiver strain.  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 No/denies             OT Education - 12/31/21 0957     Education Details Continued condition-specific education, answering pt questions as able. Discussed overview of stages (i.e., early, middle, late), potential benefit of support group/social engagement programs, progress toward goals, and safety considerations related to disease progresion, particularly related to navigational concerns while going on walks w/ his dog. Pt verbalized understanding of all education provided and OT developed a handout for pt to provide to caregiver to facilitate understanding of how to search for resources, information, and programs/support most appropriate for pt.    Person(s) Educated Patient    Methods Explanation    Comprehension Verbalized understanding             OT Short  Term Goals - 12/03/21 1622       OT SHORT TERM GOAL #1   Title Caregiver will verbalize understanding of at least 2 available resources related to provision of caregiver support (e.g., day centers, respite care, support groups, etc.)    Baseline Unable to assess at eval    Time 2    Period Weeks    Status On-going    Target Date 12/14/21      OT SHORT TERM GOAL #2   Title Pt and caregiver will report understanding of potentially beneficial AE or  supports of daily living (e.g., dementia clock, tags for missing objects, medication prompts, etc.) to decrease caregiver strain    Baseline Unable to assess at eval    Time 3    Period Weeks    Status On-going    Target Date 12/21/21             OT Long Term Goals - 12/31/21 0940       OT LONG TERM GOAL #1   Title Pt will be able to complete UB/LB dressing w/ Mod I, incorporating compensatory strategies including AE prn, at least 50% of the time by d/c    Baseline Requires assist w/ dressing tasks    Time 6    Period Weeks    Status On-going    Target Date 01/11/22      OT LONG TERM GOAL #2   Title Pt will be able to complete 9-Hole Peg Test in 1 min or less to demonstrate improved coordination and processing    Baseline 1 min, 3 sec w/ dominant UE    Time 6    Period Weeks    Status Achieved   12/31/21 - 52 sec   Target Date 01/11/22      OT LONG TERM GOAL #3   Title Pt will be able to unlock and complete 1 task on his phone w/ Mod I, using compensatory/adaptive strategies prn for safety    Baseline Required Min A to unlock phone and write a text message    Time 6    Period Weeks    Status Achieved   12/31/21 - able to unlock phone and use voice-activated digital assist to complete 1-step task; 12/19/21 - able to complete practiced phone tasks w/ Mod I inconsistently   Target Date 01/11/22      OT LONG TERM GOAL #4   Title Pt will be able to complete voice-activated calling using his phone in at least 2 attempts w/ Mod I    Baseline Increased difficulty w/ cell phone use    Time 6    Period Weeks    Status Achieved   12/31/21 - goal met; 12/19/21 - able to complete w/ SPV inconsistently   Target Date 01/11/22             Plan - 12/31/21 1432     Clinical Impression Statement OT facilitated lengthy discussion regarding condition-specific education this session due to pt's questions and report that he is very interested in finding a support group or social engagement  program. Per pt report, he has recently been having a difficult time wrapping his mind around his diagnosis and the typical/potential progression of symptoms. OT also reassessed progress toward goals w/ pt demonstrating good understanding of previously learned and practiced strategies w/ cell phone. Pt will continue to benefit from practice and repetition of pt-specific activities that he is experiencing difficulty w/ at home while in therapy to allow  for identification of most beneficial compensatory strategies and cues prn.    OT Occupational Profile and History Problem Focused Assessment - Including review of records relating to presenting problem    Occupational performance deficits (Please refer to evaluation for details): ADL's;IADL's;Work;Leisure;Social Participation    Body Structure / Function / Physical Skills Vision;ADL;IADL;Decreased knowledge of use of DME    Cognitive Skills Memory;Orientation;Problem Solve;Safety Awareness;Sequencing    Psychosocial Skills Environmental  Adaptations;Interpersonal Interaction;Routines and Behaviors    Rehab Potential Fair    Clinical Decision Making Several treatment options, min-mod task modification necessary    Comorbidities Affecting Occupational Performance: None    Modification or Assistance to Complete Evaluation  Min-Moderate modification of tasks or assist with assess necessary to complete eval    OT Frequency 2x / week    OT Duration 6 weeks    OT Treatment/Interventions Self-care/ADL training;DME and/or AE instruction;Therapeutic activities;Psychosocial skills training;Cognitive remediation/compensation;Visual/perceptual remediation/compensation;Therapeutic exercise;Functional Mobility Training;Patient/family education    Plan practice w/ dog leash if able; develop visual aid for plugging in cell phone and/or computer; card game to address executive functioning and FMC/dexterity    Consulted and Agree with Plan of Care Patient             Patient will benefit from skilled therapeutic intervention in order to improve the following deficits and impairments:   Body Structure / Function / Physical Skills: Vision, ADL, IADL, Decreased knowledge of use of DME Cognitive Skills: Memory, Orientation, Problem Solve, Safety Awareness, Sequencing Psychosocial Skills: Environmental  Adaptations, Interpersonal Interaction, Routines and Behaviors   Visit Diagnosis: Frontal lobe and executive function deficit  Other symptoms and signs involving the nervous system  Visuospatial deficit  Cognitive communication deficit   Problem List Patient Active Problem List   Diagnosis Date Noted   Early onset Alzheimer's dementia without behavioral disturbance ([facility]) 01/13/2020   Routine health maintenance 11/08/2012   Obstructive sleep apnea 01/09/2008   Allergic rhinitis 01/09/2008   GASTROESOPHAGEAL REFLUX DISEASE 01/09/2008    Rosie Fate, MSOT, OTR/L  12/31/2021, 2:33 PM  [facility] [address] [place], [place], [address] Phone: [phone]   Fax:  [phone]  Name: Brandon Nielsen MRN: [id number] Date of Birth: 1956-09-10

## 2022-01-02 ENCOUNTER — Encounter: Payer: Self-pay | Admitting: Occupational Therapy

## 2022-01-02 ENCOUNTER — Other Ambulatory Visit: Payer: Self-pay

## 2022-01-02 ENCOUNTER — Ambulatory Visit: Payer: Medicare Other | Admitting: Occupational Therapy

## 2022-01-02 DIAGNOSIS — R41844 Frontal lobe and executive function deficit: Secondary | ICD-10-CM | POA: Diagnosis not present

## 2022-01-02 DIAGNOSIS — R41842 Visuospatial deficit: Secondary | ICD-10-CM

## 2022-01-02 DIAGNOSIS — R41841 Cognitive communication deficit: Secondary | ICD-10-CM

## 2022-01-02 DIAGNOSIS — R29818 Other symptoms and signs involving the nervous system: Secondary | ICD-10-CM

## 2022-01-02 NOTE — Therapy (Signed)
[facility] ?[address], [address] ?[place], [place], [address] ?Phone: [phone]   Fax:  [phone] ? ?Occupational Therapy Treatment ? ?Patient Details  ?Name: Brandon Nielsen ?MRN: [id number] ?Date of Birth: November 01, 1955 ?Referring Provider (OT): Ellouise Newer, MD ? ? ?Encounter Date: 01/02/2022 ? ? OT End of Session - 01/02/22 0905   ? ? Visit Number 11   ? Number of Visits 13   ? Date for OT Re-Evaluation 02/26/22   ? Authorization Type Medicare (primary); BCBS (secondary)   ? Authorization Time Period VL: [place]   ? Progress Note Due on Visit 10   ? OT Start Time [phone]   ? OT Stop Time 0930   ? OT Time Calculation (min) 44 min   ? Activity Tolerance Patient tolerated treatment well   ? Behavior During Therapy [facility] for tasks assessed/performed   ? ?  ?  ? ?  ? ?Past Medical History:  ?Diagnosis Date  ? Chicken pox   ? as a child  ? Fuch's endothelial dystrophy   ? GERD (gastroesophageal reflux disease)   ? off meds - after loosing weight  ? H/O infectious mononucleosis   ? in college  ? Hepatitis   ? ? ?Past Surgical History:  ?Procedure Laterality Date  ? COLONOSCOPY    ? CORNEAL TRANSPLANT  2014  ? Bil eyes  ? NASAL SEPTUM SURGERY    ? at 56 yrs of age  ? ? ?There were no vitals filed for this visit. ? ? Subjective Assessment - 01/02/22 0849   ? ? Subjective  "He usually takes me on walks" (referring to pt's dog Charlie)   ? Patient is accompanied by: Family member   [name])  ? 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 Patient Stated Goals "To learn how to function better"   ? Currently in Pain? No/denies   ? ?  ?  ? ?  ? ? OT Treatment - 01/02/22  ? ? Task Oriented Training  Practiced latching pt's dog leash only small ring to simulate attaching leash to dog collar; completed 5x w/out any difficulty. Task graded up to practicing task-oriented training w/ pt's dog Charlie. Pt able to attach leash to dog's  collar w/out difficulty when D-ring was visible, but required Min A w/ verbal cues, gestural cues and extended time to problem solve when D-ring was not visible. ? ?Plugging in computer charger: power adapter into wall outlet and connector into computer port. Completed 3x w/ Min A, requiring initial demonstration, multimodal cues, and extended time for completion. Task downgraded to plugging in Air traffic controller w/ increased success; able to complete task 5x w/ verbal cues for sequencing of 2-step task and extended time for processing ? ?Attempting to read a monthly calendar w/ pt identifying specific days indicated by therapist. Able to identify first 2 days w/ increased time and min verbal cues/repetition; significant difficulty w/ 3rd day, requiring max cues/breakdown of task. Then attempted to read daily calendar w/ significant difficulty; pt unable to identify day of the week, date, month, year, and written appointment time w/out breakdown of task, OT blocking distracting visual stimuli, and extended time  ? ?  ?  ? ?  ? ? OT Short Term Goals - 12/03/21 1622   ? ?  ? OT SHORT TERM GOAL #1  ? Title Caregiver will verbalize understanding of at least 2 available resources related to provision of  caregiver support (e.g., day centers, respite care, support groups, etc.)   ? Baseline Unable to assess at eval   ? Time 2   ? Period Weeks   ? Status On-going   ? Target Date 12/14/21   ?  ? OT SHORT TERM GOAL #2  ? Title Pt and caregiver will report understanding of potentially beneficial AE or supports of daily living (e.g., dementia clock, tags for missing objects, medication prompts, etc.) to decrease caregiver strain   ? Baseline Unable to assess at eval   ? Time 3   ? Period Weeks   ? Status On-going   ? Target Date 12/21/21   ? ?  ?  ? ?  ? ? OT Long Term Goals - 12/31/21 0940   ? ?  ? OT LONG TERM GOAL #1  ? Title Pt will be able to complete UB/LB dressing w/ Mod I, incorporating compensatory strategies  including AE prn, at least 50% of the time by d/c   ? Baseline Requires assist w/ dressing tasks   ? Time 6   ? Period Weeks   ? Status On-going   ? Target Date 01/11/22   ?  ? OT LONG TERM GOAL #2  ? Title Pt will be able to complete 9-Hole Peg Test in 1 min or less to demonstrate improved coordination and processing   ? Baseline 1 min, 3 sec w/ dominant UE   ? Time 6   ? Period Weeks   ? Status Achieved   12/31/21 - 52 sec  ? Target Date 01/11/22   ?  ? OT LONG TERM GOAL #3  ? Title Pt will be able to unlock and complete 1 task on his phone w/ Mod I, using compensatory/adaptive strategies prn for safety   ? Baseline Required Min A to unlock phone and write a text message   ? Time 6   ? Period Weeks   ? Status Achieved   12/31/21 - able to unlock phone and use voice-activated digital assist to complete 1-step task; 12/19/21 - able to complete practiced phone tasks w/ Mod I inconsistently  ? Target Date 01/11/22   ?  ? OT LONG TERM GOAL #4  ? Title Pt will be able to complete voice-activated calling using his phone in at least 2 attempts w/ Mod I   ? Baseline Increased difficulty w/ cell phone use   ? Time 6   ? Period Weeks   ? Status Achieved   12/31/21 - goal met; 12/19/21 - able to complete w/ SPV inconsistently  ? Target Date 01/11/22   ? ?  ?  ? ?  ? ? Plan - 01/02/22 0906   ? ? Clinical Impression Statement Pt continues to benefit from OT incorporating errorless learning, repetition, and knowledge of results into task-oriented training to facilitate success w/ various functional IADLs based on most EBP for pt's w/ NCD. This session, OT practiced w/ dog leash and cell phone/computer chargers due to reported difficulty w/ these tasks while OT assessed activity to determine most effective modification/adaptive strategy to facilitate continued success w/ tasks at home. Visual perceptual deficits continue to be significantly limiting and OT will trial written/pictoral cues next session to facilitate increased  independence. OT will also continue to trial various AE to facilitate increased sense of independence and QOL prn.   ? OT Occupational Profile and History Problem Focused Assessment - Including review of records relating to presenting problem   ? Occupational performance  deficits (Please refer to evaluation for details): ADL's;IADL's;Work;Leisure;Social Participation   ? Body Structure / Function / Physical Skills Vision;ADL;IADL;Decreased knowledge of use of DME   ? Cognitive Skills Memory;Orientation;Problem Solve;Safety Awareness;Sequencing   ? Psychosocial Skills Environmental  Adaptations;Interpersonal Interaction;Routines and Behaviors   ? Rehab Potential Fair   ? Clinical Decision Making Several treatment options, min-mod task modification necessary   ? Comorbidities Affecting Occupational Performance: None   ? Modification or Assistance to Complete Evaluation  Min-Moderate modification of tasks or assist with assess necessary to complete eval   ? OT Frequency 2x / week   ? OT Duration 6 weeks   ? OT Treatment/Interventions Self-care/ADL training;DME and/or AE instruction;Therapeutic activities;Psychosocial skills training;Cognitive remediation/compensation;Visual/perceptual remediation/compensation;Therapeutic exercise;Functional Mobility Training;Patient/family education   ? Plan Trial written/pictoral cues for dog leash and cell-phone charger; familiar card game to address executive functioning and FMC/dexterity   ? Consulted and Agree with Plan of Care Patient   ? ?  ?  ? ?  ? ?Patient will benefit from skilled therapeutic intervention in order to improve the following deficits and impairments:   ?Body Structure / Function / Physical Skills: Vision, ADL, IADL, Decreased knowledge of use of DME ?Cognitive Skills: Memory, Orientation, Problem Solve, Safety Awareness, Sequencing ?Psychosocial Skills: Environmental  Adaptations, Interpersonal Interaction, Routines and Behaviors ? ? ?Visit Diagnosis: ?Frontal  lobe and executive function deficit ? ?Visuospatial deficit ? ?Other symptoms and signs involving the nervous system ? ?Cognitive communication deficit ? ? ?Problem List ?Patient Active Problem List  ? Diagnosis Date

## 2022-01-07 ENCOUNTER — Ambulatory Visit: Payer: Medicare Other | Admitting: Occupational Therapy

## 2022-01-09 ENCOUNTER — Other Ambulatory Visit: Payer: Self-pay

## 2022-01-09 ENCOUNTER — Encounter: Payer: Self-pay | Admitting: Occupational Therapy

## 2022-01-09 ENCOUNTER — Ambulatory Visit: Payer: Medicare Other | Admitting: Occupational Therapy

## 2022-01-09 DIAGNOSIS — R41841 Cognitive communication deficit: Secondary | ICD-10-CM

## 2022-01-09 DIAGNOSIS — R41844 Frontal lobe and executive function deficit: Secondary | ICD-10-CM

## 2022-01-09 DIAGNOSIS — R41842 Visuospatial deficit: Secondary | ICD-10-CM

## 2022-01-09 DIAGNOSIS — R29818 Other symptoms and signs involving the nervous system: Secondary | ICD-10-CM

## 2022-01-09 NOTE — Therapy (Signed)
[place] ?[address], [address] ?[place], [place], [address] ?Phone: [phone]   Fax:  [phone] ? ?Occupational Therapy Treatment ? ?Patient Details  ?Name: Brandon Nielsen ?MRN: [id number] ?Date of Birth: 1955-11-14 ?Referring Provider (OT): Ellouise Newer, MD ? ? ?Encounter Date: 01/09/2022 ? ? OT End of Session - 01/09/22 5300   ? ? Visit Number 12   ? Number of Visits 13   ? Date for OT Re-Evaluation 02/26/22   ? Authorization Type Medicare (primary); BCBS (secondary)   ? Authorization Time Period VL: [place]   ? Progress Note Due on Visit 10   ? OT Start Time [id number]   ? OT Stop Time 0931   ? OT Time Calculation (min) 41 min   ? Activity Tolerance Patient tolerated treatment well   ? Behavior During Therapy [facility] for tasks assessed/performed   ? ?  ?  ? ?  ? ?Past Medical History:  ?Diagnosis Date  ? Chicken pox   ? as a child  ? Fuch's endothelial dystrophy   ? GERD (gastroesophageal reflux disease)   ? off meds - after loosing weight  ? H/O infectious mononucleosis   ? in college  ? Hepatitis   ? ? ?Past Surgical History:  ?Procedure Laterality Date  ? COLONOSCOPY    ? CORNEAL TRANSPLANT  2014  ? Bil eyes  ? NASAL SEPTUM SURGERY    ? at 68 yrs of age  ? ? ?There were no vitals filed for this visit. ? ? Subjective Assessment - 01/09/22 0852   ? ? Subjective  "I used to like playing games, but they aren't fun anymore"   ? Patient is accompanied by: Family member   [name])  ? 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 Patient Stated Goals "To learn how to function better"   ? Currently in Pain? No/denies   ? ?  ?  ? ?  ? ? OT Treatment - 01/09/22  ? ? Task Oriented Training  Plugging in iPhone charger: power adapter into wall outlet and connector into cell phone port. Completed 3x w/ Min A during 1st attempt, requiring extended time and multimodal cues (verbal, visual, gestural); OT then developed simple  visual aid for practicable steps and pt was able to complete task w/ Mod I using visual aid in remaining attempts. Unable to print visual aid to administer to pt this session; will be provided next session ? ?Practiced using Haematologist to determine the date and any scheduled appointments; pt able to complete w/ Mod I, requiring extended time ?  ? Card Game Familiar Go Fish card game played to facilitate repetition of simple sequencing, visual scanning, and processing. Pt required Max A to complete activity successfully; OT provided visual/verbal cues, downgrading to just-right challenge, direct cues for correction of errors, and extended time for processing.  ? ?  ?  ? ?  ? ? OT Short Term Goals - 12/03/21 1622   ? ?  ? OT SHORT TERM GOAL #1  ? Title Caregiver will verbalize understanding of at least 2 available resources related to provision of caregiver support (e.g., day centers, respite care, support groups, etc.)   ? Baseline Unable to assess at eval   ? Time 2   ? Period Weeks   ? Status On-going   ? Target Date 12/14/21   ?  ? OT SHORT TERM GOAL #2  ? Title  Pt and caregiver will report understanding of potentially beneficial AE or supports of daily living (e.g., dementia clock, tags for missing objects, medication prompts, etc.) to decrease caregiver strain   ? Baseline Unable to assess at eval   ? Time 3   ? Period Weeks   ? Status On-going   ? Target Date 12/21/21   ? ?  ?  ? ?  ? ? OT Long Term Goals - 12/31/21 0940   ? ?  ? OT LONG TERM GOAL #1  ? Title Pt will be able to complete UB/LB dressing w/ Mod I, incorporating compensatory strategies including AE prn, at least 50% of the time by d/c   ? Baseline Requires assist w/ dressing tasks   ? Time 6   ? Period Weeks   ? Status On-going   ? Target Date 01/11/22   ?  ? OT LONG TERM GOAL #2  ? Title Pt will be able to complete 9-Hole Peg Test in 1 min or less to demonstrate improved coordination and processing   ? Baseline 1 min, 3 sec  w/ dominant UE   ? Time 6   ? Period Weeks   ? Status Achieved   12/31/21 - 52 sec  ? Target Date 01/11/22   ?  ? OT LONG TERM GOAL #3  ? Title Pt will be able to unlock and complete 1 task on his phone w/ Mod I, using compensatory/adaptive strategies prn for safety   ? Baseline Required Min A to unlock phone and write a text message   ? Time 6   ? Period Weeks   ? Status Achieved   12/31/21 - able to unlock phone and use voice-activated digital assist to complete 1-step task; 12/19/21 - able to complete practiced phone tasks w/ Mod I inconsistently  ? Target Date 01/11/22   ?  ? OT LONG TERM GOAL #4  ? Title Pt will be able to complete voice-activated calling using his phone in at least 2 attempts w/ Mod I   ? Baseline Increased difficulty w/ cell phone use   ? Time 6   ? Period Weeks   ? Status Achieved   12/31/21 - goal met; 12/19/21 - able to complete w/ SPV inconsistently  ? Target Date 01/11/22   ? ?  ?  ? ?  ? ? Plan - 01/09/22 1408   ? ? Clinical Impression Statement OT continued to try visual aid w/ written and pictoral cues to facilitate increased success and independence w/ simple functional tasks. With repetition, pt was able to complete 2-step task of plugging in cell phone charger w/ Mod I using visual aid and OT will discuss benefit of this we caregiver hopefully next week to facilitate carryover of strategy to home environment. Pt experienced significant difficulty will most elements of Go Fish activity but was able to engage w/ activity as OT provided appropriate cues for sequencing and understanding of simple game rules. Pt will likely benefit from day center or identification of simple activities he is able to complete at home in order to provide increased sense of independence and overall quality of life. OT also requested pt's caregiver come in to one of 2 sessions next week and will follow-up on this prn.   ? OT Occupational Profile and History Problem Focused Assessment - Including review of records  relating to presenting problem   ? Occupational performance deficits (Please refer to evaluation for details): ADL's;IADL's;Work;Leisure;Social Participation   ? Body  Structure / Function / Physical Skills Vision;ADL;IADL;Decreased knowledge of use of DME   ? Cognitive Skills Memory;Orientation;Problem Solve;Safety Awareness;Sequencing   ? Psychosocial Skills Environmental  Adaptations;Interpersonal Interaction;Routines and Behaviors   ? Rehab Potential Fair   ? Clinical Decision Making Several treatment options, min-mod task modification necessary   ? Comorbidities Affecting Occupational Performance: None   ? Modification or Assistance to Complete Evaluation  Min-Moderate modification of tasks or assist with assess necessary to complete eval   ? OT Frequency 2x / week   ? OT Duration 6 weeks   ? OT Treatment/Interventions Self-care/ADL training;DME and/or AE instruction;Therapeutic activities;Psychosocial skills training;Cognitive remediation/compensation;Visual/perceptual remediation/compensation;Therapeutic exercise;Functional Mobility Training;Patient/family education   ? Plan Complete recertification; provide visual aids for sequencing of donning dog leash and plugging in cell-phone charger; address dressing compensatory strategies; day centers/resptie care groups   ? Consulted and Agree with Plan of Care Patient   ? ?  ?  ? ?  ? ?Patient will benefit from skilled therapeutic intervention in order to improve the following deficits and impairments:   ?Body Structure / Function / Physical Skills: Vision, ADL, IADL, Decreased knowledge of use of DME ?Cognitive Skills: Memory, Orientation, Problem Solve, Safety Awareness, Sequencing ?Psychosocial Skills: Environmental  Adaptations, Interpersonal Interaction, Routines and Behaviors ? ? ?Visit Diagnosis: ?Frontal lobe and executive function deficit ? ?Visuospatial deficit ? ?Other symptoms and signs involving the nervous system ? ?Cognitive communication  deficit ? ? ?Problem List ?Patient Active Problem List  ? Diagnosis Date Noted  ? Early onset Alzheimer's dementia without behavioral disturbance ([place]) 01/13/2020  ? Routine health maintenance 11/08/2012  ? Obstructiv

## 2022-01-10 ENCOUNTER — Telehealth: Payer: Self-pay | Admitting: Internal Medicine

## 2022-01-10 NOTE — Telephone Encounter (Signed)
Pt states he has been unable to control his bowels for the past 3-5 mos ? ?Pt thinks his alzheimer medication could be the reason for the diarrhea, pt could not remember the name to the medication ? ?Pt did not report any other symptoms and requesting a cb w/ provider's recommendations ? ?Phone [phone] ?

## 2022-01-10 NOTE — Telephone Encounter (Signed)
Aricept is most likely to cause diarrhea. Have them stop aricept and if no improvement in diarrhea in 1-2 weeks needs visit. Or offer visit if desired.  ?

## 2022-01-14 ENCOUNTER — Ambulatory Visit: Payer: Medicare Other | Attending: Neurology | Admitting: Occupational Therapy

## 2022-01-14 ENCOUNTER — Encounter: Payer: Self-pay | Admitting: Occupational Therapy

## 2022-01-14 DIAGNOSIS — R41841 Cognitive communication deficit: Secondary | ICD-10-CM | POA: Insufficient documentation

## 2022-01-14 DIAGNOSIS — R41844 Frontal lobe and executive function deficit: Secondary | ICD-10-CM | POA: Diagnosis not present

## 2022-01-14 DIAGNOSIS — R29818 Other symptoms and signs involving the nervous system: Secondary | ICD-10-CM | POA: Insufficient documentation

## 2022-01-14 DIAGNOSIS — R41842 Visuospatial deficit: Secondary | ICD-10-CM | POA: Diagnosis present

## 2022-01-14 NOTE — Therapy (Signed)
[place] [facility] ?[place], [place], [address] ?Phone: [phone]   Fax:  [phone] ? ?Occupational Therapy Treatment ? ?Patient Details  ?Name: Brandon Nielsen ?MRN: [id number] ?Date of Birth: 01-06-1956 ?Referring Provider (OT): Ellouise Newer, MD ? ? ?Encounter Date: 01/14/2022 ? ? OT End of Session - 01/14/22 0849   ? ? Visit Number 13   ? Number of Visits 13   ? Date for OT Re-Evaluation 02/26/22   ? Authorization Type Medicare (primary); BCBS (secondary)   ? Authorization Time Period VL: [place]   ? Progress Note Due on Visit 10   ? OT Start Time 0845   ? OT Stop Time 0929   ? OT Time Calculation (min) 44 min   ? Activity Tolerance Patient tolerated treatment well   ? Behavior During Therapy [facility] for tasks assessed/performed   ? ?  ?  ? ?  ? ?Past Medical History:  ?Diagnosis Date  ? Chicken pox   ? as a child  ? Fuch's endothelial dystrophy   ? GERD (gastroesophageal reflux disease)   ? off meds - after loosing weight  ? H/O infectious mononucleosis   ? in college  ? Hepatitis   ? ? ?Past Surgical History:  ?Procedure Laterality Date  ? COLONOSCOPY    ? CORNEAL TRANSPLANT  2014  ? Bil eyes  ? NASAL SEPTUM SURGERY    ? at 72 yrs of age  ? ? ?There were no vitals filed for this visit. ? ? Subjective Assessment - 01/14/22 0846   ? ? Subjective  Pt reports he is comfortable with and would like to continue w/ OT 1x/week   ? Patient is accompanied by: Family member   Wife Brandon Nielsen)  ? 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 Patient Stated Goals "To learn how to function better"   ? Currently in Pain? No/denies   ? ?  ?  ? ?  ? ? OT Treatment   ? ? Visual Aids Reviewed pt-specific visual aids, including written instructions w/ real images, for increased independence w/ both using cell phone charger and attaching the leash to dog's collar. OT also provided education to caregiver regarding how these  can be implemented at home; pt and caregiver verbalize understanding ? ?Finding specified objects in various cabinets w/ provided visual aid (written and pictoral cues) to simulate use of strategy for storage and finding objects in home environment. Able to find 1st object w/ Min A, requiring increased time and verbal cues, and remaining 2 objects w/ Mod I ?  ? ADLs: Dressing Completed simulated LB and UB dressing w/ oversized single-color shorts and pt's zip-up jacket; able to complete both w/ Mod I, requiring 2 verbal/gestural cues to turn shorts back from inside-out and fold after doffing. ?  ? Connect 4 Connect 4 used to facilitate simple 1-2 step sequencing. OT incorporated errorless learning, repetition, mod-max multimodal cues, and downgrading to facilitate success. Visual perceptual deficits were significantly limiting to activity, as anticipated and activity was d/c after second attempt.  ? ?  ?  ? ?  ? ? OT Short Term Goals - 12/03/21 1622   ? ?  ? OT SHORT TERM GOAL #1  ? Title Caregiver will verbalize understanding of at least 2 available resources related to provision of caregiver support (e.g., day centers, respite care, support groups, etc.)   ? Baseline Unable to assess at eval   ?  Time 2   ? Period Weeks   ? Status On-going   ? Target Date 12/14/21   ?  ? OT SHORT TERM GOAL #2  ? Title Pt and caregiver will report understanding of potentially beneficial AE or supports of daily living (e.g., dementia clock, tags for missing objects, medication prompts, etc.) to decrease caregiver strain   ? Baseline Unable to assess at eval   ? Time 3   ? Period Weeks   ? Status On-going   ? Target Date 12/21/21   ? ?  ?  ? ?  ? ? OT Long Term Goals - 01/14/22 0951   ? ?  ? OT LONG TERM GOAL #1  ? Title Pt will be able to complete UB/LB dressing w/ Mod I, incorporating compensatory strategies including AE prn, at least 50% of the time by d/c   ? Baseline Requires assist w/ dressing tasks   ? Time 6   ? Period Weeks    ? Status Partially Met   01/14/22 - simulated dressing w/ Mod I in session  ? Target Date 01/11/22   ?  ? OT LONG TERM GOAL #2  ? Title Pt will be able to complete 9-Hole Peg Test in 1 min or less to demonstrate improved coordination and processing   ? Baseline 1 min, 3 sec w/ dominant UE   ? Time 6   ? Period Weeks   ? Status Achieved   12/31/21 - 52 sec  ? Target Date 01/11/22   ?  ? OT LONG TERM GOAL #3  ? Title Pt will be able to unlock and complete 1 task on his phone w/ Mod I, using compensatory/adaptive strategies prn for safety   ? Baseline Required Min A to unlock phone and write a text message   ? Time 6   ? Period Weeks   ? Status Achieved   12/31/21 - able to unlock phone and use voice-activated digital assist to complete 1-step task; 12/19/21 - able to complete practiced phone tasks w/ Mod I inconsistently  ? Target Date 01/11/22   ?  ? OT LONG TERM GOAL #4  ? Title Pt will be able to complete voice-activated calling using his phone in at least 2 attempts w/ Mod I   ? Baseline Increased difficulty w/ cell phone use   ? Time 6   ? Period Weeks   ? Status Achieved   12/31/21 - goal met; 12/19/21 - able to complete w/ SPV inconsistently  ? Target Date 01/11/22   ? ?  ?  ? ?  ? ? Plan - 01/14/22 0851   ? ? Clinical Impression Statement Visual perceptual deficits continue to be very limiting for pt; however, he was able to use created visual cues including words and pictures of specific objects w/ great success. This strategy will likely be very beneficial at home at well, particularly related to IADLs (snack prep, putting away dishes, etc.). At this point, pt has met 3/4 LTGs, demonstrating good understanding of using voice-activated digital assistant to complete tasks at home and on his cell phone, as well as slightly improving processing w/ simple tasks as evidenced by improving 9-HPT. STGs unable to be assessed due to pt's spouse not attending therapy. OT requested that pt and his wife attend upcoming session to  revisit pt's level of participation in functional activities at home and identify continued appropriate goals if needed; pt verbalized understanding and written note from OT given to pt  and reviewed w/ caregiver at conclusion of session.  ? OT Occupational Profile and History Problem Focused Assessment - Including review of records relating to presenting problem   ? Occupational performance deficits (Please refer to evaluation for details): ADL's;IADL's;Work;Leisure;Social Participation   ? Body Structure / Function / Physical Skills Vision;ADL;IADL;Decreased knowledge of use of DME   ? Cognitive Skills Memory;Orientation;Problem Solve;Safety Awareness;Sequencing   ? Psychosocial Skills Environmental  Adaptations;Interpersonal Interaction;Routines and Behaviors   ? Rehab Potential Fair   ? Clinical Decision Making Several treatment options, min-mod task modification necessary   ? Comorbidities Affecting Occupational Performance: None   ? Modification or Assistance to Complete Evaluation  Min-Moderate modification of tasks or assist with assess necessary to complete eval   ? OT Frequency 2x / week   ? OT Duration 6 weeks   ? OT Treatment/Interventions Self-care/ADL training;DME and/or AE instruction;Therapeutic activities;Psychosocial skills training;Cognitive remediation/compensation;Visual/perceptual remediation/compensation;Therapeutic exercise;Functional Mobility Training;Patient/family education   ? Plan Complete recertification  ? Consulted and Agree with Plan of Care Patient   ? ?  ?  ? ?  ? ?Patient will benefit from skilled therapeutic intervention in order to improve the following deficits and impairments:   ?Body Structure / Function / Physical Skills: Vision, ADL, IADL, Decreased knowledge of use of DME ?Cognitive Skills: Memory, Orientation, Problem Solve, Safety Awareness, Sequencing ?Psychosocial Skills: Environmental  Adaptations, Interpersonal Interaction, Routines and Behaviors ? ? ?Visit  Diagnosis: ?Frontal lobe and executive function deficit ? ?Visuospatial deficit ? ?Other symptoms and signs involving the nervous system ? ?Cognitive communication deficit ? ? ?Problem List ?Patient Active Prob

## 2022-01-16 ENCOUNTER — Ambulatory Visit (INDEPENDENT_AMBULATORY_CARE_PROVIDER_SITE_OTHER): Payer: Medicare Other | Admitting: Internal Medicine

## 2022-01-16 ENCOUNTER — Ambulatory Visit: Payer: Medicare Other | Admitting: Occupational Therapy

## 2022-01-16 ENCOUNTER — Encounter: Payer: Self-pay | Admitting: Internal Medicine

## 2022-01-16 DIAGNOSIS — R197 Diarrhea, unspecified: Secondary | ICD-10-CM | POA: Diagnosis not present

## 2022-01-16 DIAGNOSIS — R41841 Cognitive communication deficit: Secondary | ICD-10-CM

## 2022-01-16 DIAGNOSIS — R41844 Frontal lobe and executive function deficit: Secondary | ICD-10-CM | POA: Diagnosis not present

## 2022-01-16 DIAGNOSIS — R41842 Visuospatial deficit: Secondary | ICD-10-CM

## 2022-01-16 DIAGNOSIS — R29818 Other symptoms and signs involving the nervous system: Secondary | ICD-10-CM

## 2022-01-16 NOTE — Therapy (Signed)
[place] ?[facility] ?[address], [address] ?[place], [place], [address] ?Phone: [phone]   Fax:  [phone] ? ?Occupational Therapy Treatment ? ?Patient Details  ?Name: Brandon Nielsen ?MRN: [id number] ?Date of Birth: 12-25-1955 ?Referring Provider (OT): Ellouise Newer, MD ? ? ?Encounter Date: 01/16/2022 ? ? OT End of Session - 01/16/22 1407   ? ? Visit Number 14   ? Number of Visits 18   ? Date for OT Re-Evaluation 02/26/22   ? Authorization Type Medicare (primary); BCBS (secondary)   ? Authorization Time Period VL: [place]   ? Progress Note Due on Visit 10   ? OT Start Time [phone]   ? OT Stop Time 1005   ? OT Time Calculation (min) 32 min   ? Activity Tolerance Patient tolerated treatment well   ? Behavior During Therapy [facility] for tasks assessed/performed   ? ?  ?  ? ?  ? ?Past Medical History:  ?Diagnosis Date  ? Chicken pox   ? as a child  ? Fuch's endothelial dystrophy   ? GERD (gastroesophageal reflux disease)   ? off meds - after loosing weight  ? H/O infectious mononucleosis   ? in college  ? Hepatitis   ? ? ?Past Surgical History:  ?Procedure Laterality Date  ? COLONOSCOPY    ? CORNEAL TRANSPLANT  2014  ? Bil eyes  ? NASAL SEPTUM SURGERY    ? at 37 yrs of age  ? ? ?There were no vitals filed for this visit. ? ? Subjective Assessment - 01/16/22 1407   ? ? Subjective  Pt reports his wife, Izora Gala, was unable to make it to therapy session today because she was caught up at work   ? Patient is accompanied by: Family member   [name])  ? 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 Patient Stated Goals "To learn how to function better"   ? Currently in Pain? No/denies   ? ?  ?  ? ?  ? ? OT Treatment - 01/16/22  ? ? ADLs: Self-Feeding Using spoon to scoop beans 1 at a time from bowl to drop into container; cutting yellow (soft) putty using fork and knife; using knife to help scoop balls of putty onto spoon to drop into bowl;  and picking up balls of putty w/ fork to drop into bowl, scraping off w/ knife prn. Pt demonstrated good orientation to objects. Most assistance required w/ scooping putty into spoon w/ OT incorporating HOHA for errorless learning and pt demonstrating increased success w/ repetition and decreasing cues from OT. Able to complete scooping of beans w/ Mod I, requiring extended time. Remaining tasks completed at [facility], requiring verbal/tactile cues for sequencing. Multiple sets of each tasks for increased repetition to facilitate success w/ good results.  ? ?  ?  ? ?  ? ? OT Short Term Goals - 12/03/21 1622   ? ?  ? OT SHORT TERM GOAL #1  ? Title Caregiver will verbalize understanding of at least 2 available resources related to provision of caregiver support (e.g., day centers, respite care, support groups, etc.)   ? Baseline Unable to assess at eval   ? Time 2   ? Period Weeks   ? Status On-going   ? Target Date 12/14/21   ?  ? OT SHORT TERM GOAL #2  ? Title Pt and caregiver will report understanding of potentially beneficial AE or supports of daily  living (e.g., dementia clock, tags for missing objects, medication prompts, etc.) to decrease caregiver strain   ? Baseline Unable to assess at eval   ? Time 3   ? Period Weeks   ? Status On-going   ? Target Date 12/21/21   ? ?  ?  ? ?  ? ? OT Long Term Goals - 01/14/22 0951   ? ?  ? OT LONG TERM GOAL #1  ? Title Pt will be able to complete UB/LB dressing w/ Mod I, incorporating compensatory strategies including AE prn, at least 50% of the time by d/c   ? Baseline Requires assist w/ dressing tasks   ? Time 6   ? Period Weeks   ? Status Partially Met   01/14/22 - simulated dressing w/ Mod I in session  ? Target Date 01/11/22   ?  ? OT LONG TERM GOAL #2  ? Title Pt will be able to complete 9-Hole Peg Test in 1 min or less to demonstrate improved coordination and processing   ? Baseline 1 min, 3 sec w/ dominant UE   ? Time 6   ? Period Weeks   ? Status Achieved   12/31/21 - 52 sec   ? Target Date 01/11/22   ?  ? OT LONG TERM GOAL #3  ? Title Pt will be able to unlock and complete 1 task on his phone w/ Mod I, using compensatory/adaptive strategies prn for safety   ? Baseline Required Min A to unlock phone and write a text message   ? Time 6   ? Period Weeks   ? Status Achieved   12/31/21 - able to unlock phone and use voice-activated digital assist to complete 1-step task; 12/19/21 - able to complete practiced phone tasks w/ Mod I inconsistently  ? Target Date 01/11/22   ?  ? OT LONG TERM GOAL #4  ? Title Pt will be able to complete voice-activated calling using his phone in at least 2 attempts w/ Mod I   ? Baseline Increased difficulty w/ cell phone use   ? Time 6   ? Period Weeks   ? Status Achieved   12/31/21 - goal met; 12/19/21 - able to complete w/ SPV inconsistently  ? Target Date 01/11/22   ? ?  ?  ? ?  ? ? Plan - 01/16/22 1408   ? ? Clinical Impression Statement Pt's wife was unable to attend therapy session today which limited ability to further discuss continued POC w/ pt. OT provided education related to benefit of having spouse/caregiver in session to determine additional/revised functional goals, provide caregiver education related to continued care at home, and discuss carryover of learned strategies to home; pt verbalized understanding. OT then completed simulated self-feeding activity w/ pt continuing to benefit from OT incorporating errorless learning, repetition, and knowledge of results into task-oriented training to facilitate success based on most EBP for pt's w/ NCD. Due to pt meeting OT goals not requiring caregiver involvement, but ability to identify continued goals being limited w/out caregiver presence, OT concluded session early today.   ? OT Occupational Profile and History Problem Focused Assessment - Including review of records relating to presenting problem   ? Occupational performance deficits (Please refer to evaluation for details):  ADL's;IADL's;Work;Leisure;Social Participation   ? Body Structure / Function / Physical Skills Vision;ADL;IADL;Decreased knowledge of use of DME   ? Cognitive Skills Memory;Orientation;Problem Solve;Safety Awareness;Sequencing   ? Psychosocial Skills Environmental  Adaptations;Interpersonal Interaction;Routines and Behaviors   ?  Rehab Potential Fair   ? Clinical Decision Making Several treatment options, min-mod task modification necessary   ? Comorbidities Affecting Occupational Performance: None   ? Modification or Assistance to Complete Evaluation  Min-Moderate modification of tasks or assist with assess necessary to complete eval   ? OT Frequency 2x / week   ? OT Duration 6 weeks   ? OT Treatment/Interventions Self-care/ADL training;DME and/or AE instruction;Therapeutic activities;Psychosocial skills training;Cognitive remediation/compensation;Visual/perceptual remediation/compensation;Therapeutic exercise;Functional Mobility Training;Patient/family education   ? Plan Complete recertification   ? Consulted and Agree with Plan of Care Patient   ? ?  ?  ? ?  ? ?Patient will benefit from skilled therapeutic intervention in order to improve the following deficits and impairments:   ?Body Structure / Function / Physical Skills: Vision, ADL, IADL, Decreased knowledge of use of DME ?Cognitive Skills: Memory, Orientation, Problem Solve, Safety Awareness, Sequencing ?Psychosocial Skills: Environmental  Adaptations, Interpersonal Interaction, Routines and Behaviors ? ? ?Visit Diagnosis: ?Frontal lobe and executive function deficit ? ?Visuospatial deficit ? ?Other symptoms and signs involving the nervous system ? ?Cognitive communication deficit ? ? ?Problem List ?Patient Active Problem List  ? Diagnosis Date Noted  ? Early onset Alzheimer's dementia without behavioral disturbance ([place]) 01/13/2020  ? Routine health maintenance 11/08/2012  ? Obstructive sleep apnea 01/09/2008  ? Allergic rhinitis 01/09/2008  ?  GASTROESOPHAGEAL REFLUX DISEASE 01/09/2008  ? ? ?Kathrine Cords, MSOT, OTR/L ?01/16/2022, 2:09 PM ? ?[facility] ?[facility] ?[address], [address] ?[place], [place], [address] ?Phone: [phone]   Fax:  [phone] ?

## 2022-01-16 NOTE — Patient Instructions (Addendum)
We will have you stop namenda (memantine) for 1-2 weeks to see if this helps and let us know. ? ? ?

## 2022-01-16 NOTE — Progress Notes (Signed)
? ?  Subjective:  ? ?Patient ID: Brandon Nielsen, male    DOB: 05/18/1956, 66 y.o.   MRN: [id number] ? ?HPI ?The patient is a 66 YO man coming in for diarrhea. 2-3 times per day sometimes has accidents due to short notice until he has to go.  ? ?Review of Systems  ?Constitutional: Negative.   ?HENT: Negative.    ?Eyes: Negative.   ?Respiratory:  Negative for cough, chest tightness and shortness of breath.   ?Cardiovascular:  Negative for chest pain, palpitations and leg swelling.  ?Gastrointestinal:  Positive for diarrhea. Negative for abdominal distention, abdominal pain, constipation, nausea and vomiting.  ?Musculoskeletal: Negative.   ?Skin: Negative.   ?Neurological: Negative.   ?Psychiatric/Behavioral: Negative.    ? ?Objective:  ?Physical Exam ?Constitutional:   ?   Appearance: He is well-developed.  ?HENT:  ?   Head: Normocephalic and atraumatic.  ?Cardiovascular:  ?   Rate and Rhythm: Normal rate and regular rhythm.  ?Pulmonary:  ?   Effort: Pulmonary effort is normal. No respiratory distress.  ?   Breath sounds: Normal breath sounds. No wheezing or rales.  ?Abdominal:  ?   General: Bowel sounds are normal. There is no distension.  ?   Palpations: Abdomen is soft.  ?   Tenderness: There is no abdominal tenderness. There is no rebound.  ?Musculoskeletal:  ?   Cervical back: Normal range of motion.  ?Skin: ?   General: Skin is warm and dry.  ?Neurological:  ?   Mental Status: He is alert and oriented to person, place, and time.  ?   Coordination: Coordination normal.  ? ? ?Vitals:  ? 01/16/22 1049  ?BP: 112/70  ?Pulse: 62  ?Resp: 18  ?SpO2: 96%  ?Weight: 221 lb 12.8 oz (100.6 kg)  ?Height: 6\' 4"  (1.93 m)  ? ? ?This visit occurred during the SARS-CoV-2 public health emergency.  Safety protocols were in place, including screening questions prior to the visit, additional usage of staff PPE, and extensive cleaning of exam room while observing appropriate contact time as indicated for disinfecting solutions.   ? ?Assessment & Plan:  ? ?

## 2022-01-17 DIAGNOSIS — R197 Diarrhea, unspecified: Secondary | ICD-10-CM | POA: Insufficient documentation

## 2022-01-17 NOTE — Assessment & Plan Note (Signed)
Likely medication induced as this started around the same time he started namenda. Asked him to stop namenda for 1-2 weeks and then let us know if diarrhea is resolved or improved. If not improved will do stool testing. Since this is stable and going on 2-3 months unlikely C dif or other acute viral pathogen.  ?

## 2022-01-21 ENCOUNTER — Ambulatory Visit: Payer: Medicare Other | Admitting: Occupational Therapy

## 2022-01-21 ENCOUNTER — Other Ambulatory Visit: Payer: Self-pay | Admitting: Internal Medicine

## 2022-01-21 DIAGNOSIS — R41844 Frontal lobe and executive function deficit: Secondary | ICD-10-CM | POA: Diagnosis not present

## 2022-01-21 DIAGNOSIS — R41841 Cognitive communication deficit: Secondary | ICD-10-CM

## 2022-01-21 DIAGNOSIS — R29818 Other symptoms and signs involving the nervous system: Secondary | ICD-10-CM

## 2022-01-21 DIAGNOSIS — R41842 Visuospatial deficit: Secondary | ICD-10-CM

## 2022-01-22 ENCOUNTER — Encounter: Payer: Self-pay | Admitting: Occupational Therapy

## 2022-01-22 NOTE — Therapy (Signed)
?OUTPATIENT OCCUPATIONAL THERAPY TREATMENT NOTE ? ? ?Patient Name: Brandon Nielsen ?MRN: [id number] ?DOB:[birth year], 66 y.o., male ?Today's Date: 01/22/2022 ? ?PCP: Hoyt Koch, MD ?REFERRING PROVIDER: Ellouise Newer, MD ? ? OT End of Session - 01/21/22 1304   ? ? Visit Number 15   ? Number of Visits 18   ? Date for OT Re-Evaluation 02/26/22   ? Authorization Type Medicare (primary); BCBS (secondary)   ? Authorization Time Period VL: [place]   ? Progress Note Due on Visit 20   ? OT Start Time [phone]   pt arrival time  ? OT Stop Time 7902   ? OT Time Calculation (min) 53 min   ? Activity Tolerance Patient tolerated treatment well   ? Behavior During Therapy [facility] for tasks assessed/performed   ? ?  ?  ? ?  ? ?Past Medical History:  ?Diagnosis Date  ? Chicken pox   ? as a child  ? Fuch's endothelial dystrophy   ? GERD (gastroesophageal reflux disease)   ? off meds - after loosing weight  ? H/O infectious mononucleosis   ? in college  ? Hepatitis   ? ?Past Surgical History:  ?Procedure Laterality Date  ? COLONOSCOPY    ? CORNEAL TRANSPLANT  2014  ? Bil eyes  ? NASAL SEPTUM SURGERY    ? at 22 yrs of age  ? ?Patient Active Problem List  ? Diagnosis Date Noted  ? Diarrhea 01/17/2022  ? 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 Routine health maintenance 11/08/2012  ? Obstructive sleep apnea 01/09/2008  ? Allergic rhinitis 01/09/2008  ? GASTROESOPHAGEAL REFLUX DISEASE 01/09/2008  ? ? ?ONSET DATE: April 2021 (date of diagnosis) ? ?REFERRING DIAG: ?R29.818 (ICD-10-CM) - Other symptoms and signs involving the nervous system ?R41.844 (ICD-10-CM) - Frontal lobe and executive function deficit ? ?THERAPY DIAG:  ?Frontal lobe and executive function deficit ? ?Visuospatial deficit ? ?Other symptoms and signs involving the nervous system ? ?Cognitive communication deficit ? ? 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 ?PRECAUTIONS: None ? ?SUBJECTIVE:  ? ?SUBJECTIVE STATEMENT: ?"I really feel like I get a lot out of coming here" ? ?PAIN:  ?Are you having pain? No ? ? ?OBJECTIVE:  ? ?TODAY'S TREATMENT:  ?See 'Patient Education' below ? ? ?PATIENT EDUCATION: ?Education details: Thorough discussion w/ pt and his wife regarding psychological support for pt including likely benefit of counselor and/or day programs/events that pt is able to participate in, including potential methods of seeking out those supports and other strategies to allow pt to continue to be involved particularly when spending time w/ family. Also continued condition-specific education, including typical differentiation of stages, safety considerations, changing roles w/ disease progression, and most effective strategies of cues/facilitation for success when pt attempts a task based on most EBP. Occupational therapy intervention approaches related to OP setting and pt diagnosis also discussed; i.e., modification (compensation, adaptation) and maintenance vs. prevention, remediation/restoration, and creation and promotion. ?Person educated: Patient and Spouse ?Education method: Explanation ?Education comprehension: verbalized understanding ? ? ?[place] ?None ? ? SHORT-TERM GOALS                Target Date: 12/21/21  ? ? STG #1  ? Title Caregiver will verbalize understanding of at least 2 available resources related to provision of caregiver support (e.g., day centers, respite care, support groups, etc.)   ? Baseline Unable to assess at eval   ?  Status MET   01/22/22  ?  ? STG #2  ? Title Pt and/or caregiver will report understanding of potentially beneficial AE or supports of daily living (e.g., dementia clock, tags for missing objects, medication prompts, etc.) to decrease caregiver strain   ? Baseline Unable to assess at eval   ? Status IN PROGRESS  ? Target Date 02/26/22   ? ?  ?  ? ? LONG-TERM GOALS                Target Date: 01/11/22  ? ? LTG #1   ? Title Pt will be able to complete UB/LB dressing w/ Mod I, incorporating compensatory strategies including AE prn, at least 50% of the time by d/c   ? Baseline Requires assist w/ dressing tasks   ? Status MET   01/14/22 - simulated dressing w/ Mod I in session  ?  ? LTG #2  ? Title Pt will be able to complete 9-Hole Peg Test in 1 min or less to demonstrate improved coordination and processing   ? Baseline 1 min, 3 sec w/ dominant UE   ? Status MET   12/31/21 - 52 sec  ?  ? LTG #3  ? Title Pt will be able to unlock and complete 1 task on his phone w/ Mod I, using compensatory/adaptive strategies prn for safety   ? Baseline Required Min A to unlock phone and write a text message   ? Status MET   12/31/21 - able to unlock phone and use voice-activated digital assist to complete 1-step task; 12/19/21 - able to complete practiced phone tasks w/ Mod I inconsistently  ?  ? LTG #4  ? Title Pt will be able to complete voice-activated calling using his phone in at least 2 attempts w/ Mod I   ? Baseline Increased difficulty w/ cell phone use   ? Status MET   12/31/21 - goal met; 12/19/21 - able to complete w/ SPV inconsistently  ? ?  ?  ? ? PLAN  ? ? Clinical Impression Statement Pt is a 66 y/o male seen in OP therapy due to deficits w/ executive functioning 2/2 early-onset Alzheimer's dementia. Pt has met 1/2 STGs and all LTGs at this time. Considering this, OT discussed benefit of caregiver/spouse attendance in prior session in order to complete re-evaluation this session. Pt's wife was present this session w/ OT able to facilitate thorough discussion w/ pt and his wife to determine current functional status and new/updated functional goals for OP OT prn. At this point, pt's wife reports she is completing most tasks for pt at home and is experiencing a significant amount of caregiver strain. Pt will continue to benefit from skilled OP therapy to address recommendation and practice of simple AE and/or supports available to work  toward decreasing caregiver strain and to increase pt's sense of independence.  ? OT Occupational Profile and History Problem Focused Assessment - Including review of records relating to presenting problem   ? Occupational performance deficits (Please refer to evaluation for details): ADL's;IADL's;Work;Leisure;Social Participation   ? Body Structure / Function / Physical Skills Vision;ADL;IADL;Decreased knowledge of use of DME   ? Cognitive Skills Memory;Orientation;Problem Solve;Safety Awareness;Sequencing   ? Psychosocial Skills Environmental  Adaptations;Interpersonal Interaction;Routines and Behaviors   ? Rehab Potential Fair   ? Clinical Decision Making Several treatment options, min-mod task modification necessary   ? Comorbidities Affecting Occupational Performance: None   ? OT Frequency 2x / week   ? OT Duration  6 weeks   ? OT Treatment/Interventions Self-care/ADL training;DME and/or AE instruction;Therapeutic activities;Psychosocial skills training;Cognitive remediation/compensation;Visual/perceptual remediation/compensation;Therapeutic exercise;Functional Mobility Training;Patient/family education   ? Plan Provide information on caregiver strain/stages and behavior;   ? Consulted and Agree with Plan of Care Patient   ? ?  ?  ? ?Kathrine Cords, MSOT, OTR/L  ?01/22/2022, 9:33 AM ?

## 2022-01-23 ENCOUNTER — Encounter: Payer: Self-pay | Admitting: Occupational Therapy

## 2022-01-23 ENCOUNTER — Ambulatory Visit: Payer: Medicare Other | Admitting: Occupational Therapy

## 2022-01-23 DIAGNOSIS — R41842 Visuospatial deficit: Secondary | ICD-10-CM

## 2022-01-23 DIAGNOSIS — R41844 Frontal lobe and executive function deficit: Secondary | ICD-10-CM | POA: Diagnosis not present

## 2022-01-23 DIAGNOSIS — R29818 Other symptoms and signs involving the nervous system: Secondary | ICD-10-CM

## 2022-01-23 DIAGNOSIS — R41841 Cognitive communication deficit: Secondary | ICD-10-CM

## 2022-01-23 NOTE — Therapy (Signed)
?OUTPATIENT OCCUPATIONAL THERAPY TREATMENT NOTE ? ? ?Patient Name: Brandon Nielsen ?MRN: [id number] ?DOB:04-29-1956, 66 y.o., male ?Today's Date: 01/23/2022 ? ?PCP: Hoyt Koch, MD ?REFERRING PROVIDER: Ellouise Newer, MD ? ? OT End of Session - 01/23/22 7939   ? ? Visit Number 16   ? Number of Visits 18   ? Date for OT Re-Evaluation 02/26/22   ? Authorization Type Medicare (primary); BCBS (secondary)   ? Authorization Time Period VL: [place]   ? Progress Note Due on Visit 20   ? OT Start Time [id number]   ? OT Stop Time 1018   ? OT Time Calculation (min) 41 min   ? Activity Tolerance Patient tolerated treatment well   ? Behavior During Therapy [facility] for tasks assessed/performed   ? ?  ?  ? ?  ? ?Past Medical History:  ?Diagnosis Date  ? Chicken pox   ? as a child  ? Fuch's endothelial dystrophy   ? GERD (gastroesophageal reflux disease)   ? off meds - after loosing weight  ? H/O infectious mononucleosis   ? in college  ? Hepatitis   ? ?Past Surgical History:  ?Procedure Laterality Date  ? COLONOSCOPY    ? CORNEAL TRANSPLANT  2014  ? Bil eyes  ? NASAL SEPTUM SURGERY    ? at 27 yrs of age  ? ?Patient Active Problem List  ? Diagnosis Date Noted  ? Diarrhea 01/17/2022  ? Early onset Alzheimer's dementia without behavioral disturbance ([place]) 01/13/2020  ? Routine health maintenance 11/08/2012  ? Obstructive sleep apnea 01/09/2008  ? Allergic rhinitis 01/09/2008  ? GASTROESOPHAGEAL REFLUX DISEASE 01/09/2008  ? ? ?ONSET DATE: April 2021 (date of diagnosis) ? ?REFERRING DIAG: ?R29.818 (ICD-10-CM) - Other symptoms and signs involving the nervous system ?R41.844 (ICD-10-CM) - Frontal lobe and executive function deficit ? ?THERAPY DIAG:  ?Frontal lobe and executive function deficit ? ?Visuospatial deficit ? ?Other symptoms and signs involving the nervous system ? ?Cognitive communication deficit ? ? ?PERTINENT HISTORY: Early onset Alzheimer's dementia without behavioral disturbance; anxiety, depression  ? ?PRECAUTIONS:  None ? ?SUBJECTIVE:  ? ?SUBJECTIVE STATEMENT: ?Pt reports he does not have difficulty using the TV remote ? ?PAIN:  ?Are you having pain? No ? ? ?OBJECTIVE:  ? ?TODAY'S TREATMENT:  ?Practiced using simulated Flipper remote to control TV: identified which color buttons controlled each action (e.g., green for on/off, orange for volume, and blue for channel) w/ OT then asking pt to "control TV" using specified buttons (e.g., "how would you turn the volume up?") Pt initially required Max A w/ breakdown of task, verbal/visual cues, and contextual cues for understanding; success improved w/ repetition but still required Min-Mod A for success. ?Turning off phone alarm for simulated medication reminder; OT set alarm on cell phone w/ pt able to stop alarm w/out difficulty. OT also discussed other options for increasing independence w/ medication management, particularly as disease progresses w/ pt verbalizing understanding. ? ? ?PATIENT EDUCATION: ?Education details: Reviewed condition-specific education, specifically disease stages and pertinent safety considerations ?Person educated: Patient ?Education method: Explanation ?Education comprehension: verbalized understanding ? ? ?[place] ?None ? ? SHORT-TERM GOALS                Target Date: 12/21/21  ? ? STG #1  ? Title Caregiver will verbalize understanding of at least 2 available resources related to provision of caregiver support (e.g., day centers, respite care, support groups, etc.)   ? Baseline Unable to assess at eval   ?  Status MET   01/22/22  ?  ? STG #2  ? Title Pt and/or caregiver will report understanding of potentially beneficial AE or supports of daily living (e.g., dementia clock, tags for missing objects, medication prompts, etc.) to decrease caregiver strain   ? Baseline Unable to assess at eval   ? Status IN PROGRESS  ? Target Date 02/26/22   ? ?  ?  ? ? LONG-TERM GOALS                Target Date: 01/11/22  ? ? LTG #1  ? Title Pt will be able to  complete UB/LB dressing w/ Mod I, incorporating compensatory strategies including AE prn, at least 50% of the time by d/c   ? Baseline Requires assist w/ dressing tasks   ? Status MET   01/14/22 - simulated dressing w/ Mod I in session  ?  ? LTG #2  ? Title Pt will be able to complete 9-Hole Peg Test in 1 min or less to demonstrate improved coordination and processing   ? Baseline 1 min, 3 sec w/ dominant UE   ? Status MET   12/31/21 - 52 sec  ?  ? LTG #3  ? Title Pt will be able to unlock and complete 1 task on his phone w/ Mod I, using compensatory/adaptive strategies prn for safety   ? Baseline Required Min A to unlock phone and write a text message   ? Status MET   12/31/21 - able to unlock phone and use voice-activated digital assist to complete 1-step task; 12/19/21 - able to complete practiced phone tasks w/ Mod I inconsistently  ?  ? LTG #4  ? Title Pt will be able to complete voice-activated calling using his phone in at least 2 attempts w/ Mod I   ? Baseline Increased difficulty w/ cell phone use   ? Status MET   12/31/21 - goal met; 12/19/21 - able to complete w/ SPV inconsistently  ? ?  ?  ? ? PLAN  ? ? Clinical Impression Statement Considering conversation in prior session regarding goals of increasing independence and decreasing caregiver strain, OT addressed potential benefit of various AE, focusing on using the TV and medication management. Pt did indicate he largely uses streaming services vs. cable TV, which may negate benefit of adaptive TV remote; OT to clarify this prior to next session. Increased difficulty w/ task likely as least in part due to novelty of task. Pt continues to benefit from incorporation of errorless learning, repetition, and knowledge of results into task-oriented training to facilitate success based on most EBP for pt's w/ NCD.  ? OT Occupational Profile and History Problem Focused Assessment - Including review of records relating to presenting problem   ? Occupational performance  deficits (Please refer to evaluation for details): ADL's;IADL's;Work;Leisure;Social Participation   ? Body Structure / Function / Physical Skills Vision;ADL;IADL;Decreased knowledge of use of DME   ? Cognitive Skills Memory;Orientation;Problem Solve;Safety Awareness;Sequencing   ? Psychosocial Skills Environmental  Adaptations;Interpersonal Interaction;Routines and Behaviors   ? Rehab Potential Fair   ? Clinical Decision Making Several treatment options, min-mod task modification necessary   ? Comorbidities Affecting Occupational Performance: None   ? OT Frequency 2x / week   ? OT Duration 6 weeks   ? OT Treatment/Interventions Self-care/ADL training;DME and/or AE instruction;Therapeutic activities;Psychosocial skills training;Cognitive remediation/compensation;Visual/perceptual remediation/compensation;Therapeutic exercise;Functional Mobility Training;Patient/family education   ? Plan Provide information on caregiver strain/stages and behavior; continue to practice AE prn  ?  Consulted and Agree with Plan of Care Patient   ? ?  ?  ? ?Kathrine Cords, MSOT, OTR/L  ?01/23/2022, 9:33 AM ?

## 2022-01-28 ENCOUNTER — Ambulatory Visit: Payer: Medicare Other | Admitting: Occupational Therapy

## 2022-01-30 ENCOUNTER — Ambulatory Visit: Payer: Medicare Other | Admitting: Occupational Therapy

## 2022-01-30 ENCOUNTER — Encounter: Payer: Self-pay | Admitting: Occupational Therapy

## 2022-01-30 DIAGNOSIS — R41842 Visuospatial deficit: Secondary | ICD-10-CM

## 2022-01-30 DIAGNOSIS — R41844 Frontal lobe and executive function deficit: Secondary | ICD-10-CM | POA: Diagnosis not present

## 2022-01-30 DIAGNOSIS — R29818 Other symptoms and signs involving the nervous system: Secondary | ICD-10-CM

## 2022-01-30 DIAGNOSIS — R41841 Cognitive communication deficit: Secondary | ICD-10-CM

## 2022-01-30 NOTE — Therapy (Signed)
?OUTPATIENT OCCUPATIONAL THERAPY TREATMENT NOTE ? ? ?Patient Name: Brandon Nielsen ?MRN: [id number] ?DOB:[birth year], 66 y.o., male ?Today's Date: 01/30/2022 ? ?PCP: Hoyt Koch, MD ?REFERRING PROVIDER: Ellouise Newer, MD ? ? OT End of Session - 01/30/22 0935   ? ? Visit Number 17   ? Number of Visits 18   ? Date for OT Re-Evaluation 02/26/22   ? Authorization Type Medicare (primary); BCBS (secondary)   ? Authorization Time Period VL: [place]   ? Progress Note Due on Visit 20   ? OT Start Time 0932   ? OT Stop Time 1014   ? OT Time Calculation (min) 42 min   ? Activity Tolerance Patient tolerated treatment well   ? Behavior During Therapy [facility] for tasks assessed/performed   ? ?  ?  ? ?  ? ?Past Medical History:  ?Diagnosis Date  ? Chicken pox   ? as a child  ? Fuch's endothelial dystrophy   ? GERD (gastroesophageal reflux disease)   ? off meds - after loosing weight  ? H/O infectious mononucleosis   ? in college  ? Hepatitis   ? ?Past Surgical History:  ?Procedure Laterality Date  ? COLONOSCOPY    ? CORNEAL TRANSPLANT  2014  ? Bil eyes  ? NASAL SEPTUM SURGERY    ? at 39 yrs of age  ? ?Patient Active Problem List  ? Diagnosis Date Noted  ? Diarrhea 01/17/2022  ? Early onset Alzheimer's dementia without behavioral disturbance ([place]) 01/13/2020  ? Routine health maintenance 11/08/2012  ? Obstructive sleep apnea 01/09/2008  ? Allergic rhinitis 01/09/2008  ? GASTROESOPHAGEAL REFLUX DISEASE 01/09/2008  ? ? ?ONSET DATE: April 2021 (date of diagnosis) ? ?REFERRING DIAG: ?R29.818 (ICD-10-CM) - Other symptoms and signs involving the nervous system ?R41.844 (ICD-10-CM) - Frontal lobe and executive function deficit ? ?THERAPY DIAG:  ?Frontal lobe and executive function deficit ? ?Visuospatial deficit ? ?Other symptoms and signs involving the nervous system ? ?Cognitive communication deficit ? ? ?PERTINENT HISTORY: Early onset Alzheimer's dementia without behavioral disturbance; anxiety, depression  ? ?PRECAUTIONS:  None ? ?SUBJECTIVE:  ? ?SUBJECTIVE STATEMENT: ?Pt reports a caregiver from [employer] has been staying overnight the past few days because his wife is out of town ? ?PAIN:  ?Are you having pain? No ? ? ?OBJECTIVE:  ? ?TODAY'S TREATMENT:  ?Self-feeding - using standard spoon to scoop beans off of a plate, and cutting yellow (soft) putty using a knife and fork. Pt demonstrated increased success when using spoon w/ built up handle as well as w/ a plate guard when scooping beans from plate. Attempted cutting putty w/ both standard fork and knife and then built-up handle fork and rocker knife. Required HOH A when using adaptive utensils to cut putty. Increased success w/ repetition when using standard utensils. ?Discussed memory compensatory strategies, most notably using visual cue as an external/environmental reminder for task completion. OT developed visual cue w/ pictorial/written instruction to assist w/ recall of charging cell phone at nighttime; pt verbalized understanding. ? ?PATIENT EDUCATION: ?Education details: Continued condition-specific education ?Person educated: Patient ?Education method: Explanation ?Education comprehension: verbalized understanding ? ? ?[place] ?None ? ? SHORT-TERM GOALS                Target Date: 12/21/21  ? ? STG #1  ? Title Caregiver will verbalize understanding of at least 2 available resources related to provision of caregiver support (e.g., day centers, respite care, support groups, etc.)   ? Baseline Unable  to assess at eval   ? Status MET   01/22/22  ?  ? STG #2  ? Title Pt and/or caregiver will report understanding of potentially beneficial AE or supports of daily living (e.g., dementia clock, tags for missing objects, medication prompts, etc.) to decrease caregiver strain   ? Baseline Unable to assess at eval   ? Status IN PROGRESS  ? Target Date 02/26/22   ? ?  ?  ? ? LONG-TERM GOALS                Target Date: 01/11/22  ? ? LTG #1  ? Title Pt will be able to  complete UB/LB dressing w/ Mod I, incorporating compensatory strategies including AE prn, at least 50% of the time by d/c   ? Baseline Requires assist w/ dressing tasks   ? Status MET   01/14/22 - simulated dressing w/ Mod I in session  ?  ? LTG #2  ? Title Pt will be able to complete 9-Hole Peg Test in 1 min or less to demonstrate improved coordination and processing   ? Baseline 1 min, 3 sec w/ dominant UE   ? Status MET   12/31/21 - 52 sec  ?  ? LTG #3  ? Title Pt will be able to unlock and complete 1 task on his phone w/ Mod I, using compensatory/adaptive strategies prn for safety   ? Baseline Required Min A to unlock phone and write a text message   ? Status MET   12/31/21 - able to unlock phone and use voice-activated digital assist to complete 1-step task; 12/19/21 - able to complete practiced phone tasks w/ Mod I inconsistently  ?  ? LTG #4  ? Title Pt will be able to complete voice-activated calling using his phone in at least 2 attempts w/ Mod I   ? Baseline Increased difficulty w/ cell phone use   ? Status MET   12/31/21 - goal met; 12/19/21 - able to complete w/ SPV inconsistently  ? ?  ?  ? ? PLAN  ? ? Clinical Impression Statement Pt appeared slightly more confused today, unable to identify a cell phone in a provided picture, requiring HOH A to cut food after initial success, and having difficulty answering questions accurately. Pt did report last week that namenda (cognition-enhancing med) was d/c due to side-effects, which may be a contributing factor. Considering presentation today, OT focused only on familiar tasks this session, problem-solving for continued independence w/ use of utensils during meal times and charging his cell phone. OT discussed current methods implemented to assist w/ memory/cognition at home w/ pt's caregiver who reports he was having difficulty locating his clothes this morning; OT recommended use of visual aids (e.g., labels on drawers; reminders next to medicine, chargers, etc.) w/  caregiver verbalizing understanding. OT did also review POC, including potential for upcoming d/c, w/ pt who is agreeable at this time.  ? OT Occupational Profile and History Problem Focused Assessment - Including review of records relating to presenting problem   ? Occupational performance deficits (Please refer to evaluation for details): ADL's;IADL's;Work;Leisure;Social Participation   ? Body Structure / Function / Physical Skills Vision;ADL;IADL;Decreased knowledge of use of DME   ? Cognitive Skills Memory;Orientation;Problem Solve;Safety Awareness;Sequencing   ? Psychosocial Skills Environmental  Adaptations;Interpersonal Interaction;Routines and Behaviors   ? Rehab Potential Fair   ? Clinical Decision Making Several treatment options, min-mod task modification necessary   ? Comorbidities Affecting Occupational Performance: None   ?  OT Frequency 2x / week   ? OT Duration 6 weeks   ? OT Treatment/Interventions Self-care/ADL training;DME and/or AE instruction;Therapeutic activities;Psychosocial skills training;Cognitive remediation/compensation;Visual/perceptual remediation/compensation;Therapeutic exercise;Functional Mobility Training;Patient/family education   ? Plan Provide information on caregiver strain/stages and behavior; continue to practice AE prn  ? Consulted and Agree with Plan of Care Patient   ? ?  ?  ? ?Kathrine Cords, MSOT, OTR/L  ?01/30/2022, 11:11 AM ?

## 2022-02-04 ENCOUNTER — Ambulatory Visit: Payer: Medicare Other | Admitting: Occupational Therapy

## 2022-02-04 ENCOUNTER — Encounter: Payer: Self-pay | Admitting: Occupational Therapy

## 2022-02-06 ENCOUNTER — Encounter: Payer: Self-pay | Admitting: Occupational Therapy

## 2022-02-06 ENCOUNTER — Ambulatory Visit: Payer: Medicare Other | Admitting: Occupational Therapy

## 2022-02-13 ENCOUNTER — Encounter: Payer: Self-pay | Admitting: Occupational Therapy

## 2022-02-20 ENCOUNTER — Ambulatory Visit: Payer: Medicare Other | Attending: Neurology | Admitting: Occupational Therapy

## 2022-02-20 ENCOUNTER — Encounter: Payer: Self-pay | Admitting: Occupational Therapy

## 2022-02-20 DIAGNOSIS — R41842 Visuospatial deficit: Secondary | ICD-10-CM | POA: Insufficient documentation

## 2022-02-20 DIAGNOSIS — R41844 Frontal lobe and executive function deficit: Secondary | ICD-10-CM | POA: Insufficient documentation

## 2022-02-20 DIAGNOSIS — R41841 Cognitive communication deficit: Secondary | ICD-10-CM | POA: Insufficient documentation

## 2022-02-20 DIAGNOSIS — R29818 Other symptoms and signs involving the nervous system: Secondary | ICD-10-CM | POA: Insufficient documentation

## 2022-02-20 NOTE — Therapy (Signed)
?OUTPATIENT OCCUPATIONAL THERAPY ?TREATMENT NOTE & DISCHARGE SUMMARY ? ? ?Patient Name: Brandon Nielsen ?MRN: [id number] ?DOB:07-03-1956, 66 y.o., male ?Today's Date: 02/20/2022 ? ?PCP: Hoyt Koch, MD ?REFERRING PROVIDER: Ellouise Newer, MD ? ? OT End of Session - 02/20/22 1655   ? ? Visit Number 18   ? Number of Visits 18   ? Date for OT Re-Evaluation --   ? Authorization Type Medicare (primary); BCBS (secondary)   ? Authorization Time Period VL: [place]   ? Progress Note Due on Visit 20   ? OT Start Time [phone]   ? OT Stop Time 0915   ? OT Time Calculation (min) 31 min   ? Activity Tolerance Patient tolerated treatment well   ? Behavior During Therapy [facility] [address] for tasks assessed/performed   ? ?  ?  ? ?  ? ?OCCUPATIONAL THERAPY DISCHARGE SUMMARY ? ?Visits from Start of Care: 18 ? ?Current functional level related to goals / functional outcomes: ?Pt and spouse verbalize understanding of available resources for both patient and caregiver support and potentially beneficial compensatory/adaptive strategies. Pt decreased time to complete 9-HPT by 11 sec and improved participation w/ phone use and management. ?  ?Remaining deficits: ?Significant visuospatial deficits and limitations w/ higher level executive functioning (planning, organizing, connecting information, transitioning, and self-monitoring); decreased processing speed; limitations w/ independence and participation in both BADLs and IADLs. ?  ?Education / Equipment: ?Local and online resources, AE/supports of daily living; compensatory strategies (including visual aids, verbal cueing, etc) ? ?Patient agrees to discharge. Patient goals were met. Patient is being discharged due to meeting the stated rehab goals.. ? ? ?Past Medical History:  ?Diagnosis Date  ? Chicken pox   ? as a child  ? Fuch's endothelial dystrophy   ? GERD (gastroesophageal reflux disease)   ? off meds - after loosing weight  ? H/O infectious mononucleosis   ? in college  ? Hepatitis   ? ?Past  Surgical History:  ?Procedure Laterality Date  ? COLONOSCOPY    ? CORNEAL TRANSPLANT  2014  ? Bil eyes  ? NASAL SEPTUM SURGERY    ? at 48 yrs of age  ? ?Patient Active Problem List  ? Diagnosis Date Noted  ? Diarrhea 01/17/2022  ? Early onset Alzheimer's dementia without behavioral disturbance ([place]) 01/13/2020  ? Routine health maintenance 11/08/2012  ? Obstructive sleep apnea 01/09/2008  ? Allergic rhinitis 01/09/2008  ? GASTROESOPHAGEAL REFLUX DISEASE 01/09/2008  ? ? ?ONSET DATE: April 2021 (date of diagnosis) ? ?REFERRING DIAG: ?R29.818 (ICD-10-CM) - Other symptoms and signs involving the nervous system ?R41.844 (ICD-10-CM) - Frontal lobe and executive function deficit ? ?THERAPY DIAG:  ?Frontal lobe and executive function deficit ? ?Visuospatial deficit ? ?Other symptoms and signs involving the nervous system ? ?Cognitive communication deficit ? ? ?PERTINENT HISTORY: Early onset Alzheimer's dementia without behavioral disturbance; anxiety, depression  ? ?PRECAUTIONS: None ? ?SUBJECTIVE: ? ?SUBJECTIVE STATEMENT: ?Pt reports he visited his daughter in [place] and had a great trip ? ?PAIN:  ?Are you having pain? No ? ? ?OBJECTIVE:  ? ?TODAY'S TREATMENT: ?See 'Education' below ? ?PATIENT EDUCATION: ?Continued condition-specific education, particularly related to typical course of therapy across stages of progression, including potential benefit of HH services to provide task-oriented training in the home w/ parameters and environment as true to the actual task as possible; discussion of next steps in anticipation of d/c; pertinent approaches to therapy (I.e., compensation and modification vs. restoration); and importance of psychological/social health related to pt's overall  wellbeing. ?Person educated: Patient and Spouse ?Education method: Explanation ?Education comprehension: verbalized understanding ? ? ?Olivia Lopez de Gutierrez ?None ? ? SHORT-TERM GOALS                Target Date: 12/21/21  ? ? STG #1  ? Title  Caregiver will verbalize understanding of at least 2 available resources related to provision of caregiver support (e.g., day centers, respite care, support groups, etc.)   ? Baseline Unable to assess at eval   ? Status MET   01/22/22  ?  ? STG #2  ? Title Pt and/or caregiver will report understanding of potentially beneficial AE or supports of daily living (e.g., dementia clock, tags for missing objects, medication prompts, etc.) to decrease caregiver strain   ? Baseline Unable to assess at eval   ? Status MET   02/20/22  ? ?  ?  ? ? LONG-TERM GOALS                Target Date: 01/11/22  ? ? LTG #1  ? Title Pt will be able to complete UB/LB dressing w/ Mod I, incorporating compensatory strategies including AE prn, at least 50% of the time by d/c   ? Baseline Requires assist w/ dressing tasks   ? Status MET   01/14/22 - simulated dressing w/ Mod I in session  ?  ? LTG #2  ? Title Pt will be able to complete 9-Hole Peg Test in 1 min or less to demonstrate improved coordination and processing   ? Baseline 1 min, 3 sec w/ dominant UE   ? Status MET   12/31/21 - 52 sec  ?  ? LTG #3  ? Title Pt will be able to unlock and complete 1 task on his phone w/ Mod I, using compensatory/adaptive strategies prn for safety   ? Baseline Required Min A to unlock phone and write a text message   ? Status MET   12/31/21 - able to unlock phone and use voice-activated digital assist to complete 1-step task; 12/19/21 - able to complete practiced phone tasks w/ Mod I inconsistently  ?  ? LTG #4  ? Title Pt will be able to complete voice-activated calling using his phone in at least 2 attempts w/ Mod I   ? Baseline Increased difficulty w/ cell phone use   ? Status MET   12/31/21 - goal met; 12/19/21 - able to complete w/ SPV inconsistently  ? ?  ?  ? ? PLAN  ? ? Clinical Impression Statement Mr. Karnes is a 66 y/o male who has been seen in OP OT for executive functioning deficits 2/2 early-onset Alzheimers. Pt has shown improvements in simple task  completion using cellphone and understanding of visual aids w/ all STGs and LTGs met. Pt's spouse verbalizes understanding of relevant recommendations for task simplification, available caregiver and patient supports, and potentially beneficial AE. Pt is currently appropriate for d/c from skilled OT to home at this time, reports he is satisfied with activities addressed in therapy, and is agreeable to discharge plan. Pt and his spouse were encouraged to contact OT w/ any additional questions and are aware of concerns that may warrant receiving a new referral for OT services.  ? OT Occupational Profile and History Problem Focused Assessment - Including review of records relating to presenting problem   ? Occupational performance deficits (Please refer to evaluation for details): ADL's;IADL's;Work;Leisure;Social Participation   ? Body Structure / Function / Physical Skills Vision;ADL;IADL;Decreased knowledge of  use of DME   ? Cognitive Skills Memory;Orientation;Problem Solve;Safety Awareness;Sequencing   ? Psychosocial Skills Environmental  Adaptations;Interpersonal Interaction;Routines and Behaviors   ? Rehab Potential Fair   ? Clinical Decision Making Several treatment options, min-mod task modification necessary   ? Comorbidities Affecting Occupational Performance: None   ? OT Frequency 2x / week   ? OT Duration 6 weeks   ? OT Treatment/Interventions Self-care/ADL training;DME and/or AE instruction;Therapeutic activities;Psychosocial skills training;Cognitive remediation/compensation;Visual/perceptual remediation/compensation;Therapeutic exercise;Functional Mobility Training;Patient/family education   ? Plan D/C  ? Consulted and Agree with Plan of Care Patient   ? ?  ?  ? ?Kathrine Cords, MSOT, OTR/L  ?02/20/2022, 9:48 AM ?

## 2022-02-27 ENCOUNTER — Ambulatory Visit (INDEPENDENT_AMBULATORY_CARE_PROVIDER_SITE_OTHER): Payer: Medicare Other | Admitting: Neurology

## 2022-02-27 ENCOUNTER — Encounter: Payer: Self-pay | Admitting: Neurology

## 2022-02-27 VITALS — BP 130/82 | HR 53 | Ht 76.0 in | Wt 221.0 lb

## 2022-02-27 DIAGNOSIS — G300 Alzheimer's disease with early onset: Secondary | ICD-10-CM

## 2022-02-27 DIAGNOSIS — F02818 Dementia in other diseases classified elsewhere, unspecified severity, with other behavioral disturbance: Secondary | ICD-10-CM | POA: Diagnosis not present

## 2022-02-27 MED ORDER — ESCITALOPRAM OXALATE 20 MG PO TABS: ORAL_TABLET | ORAL | 11 refills | Status: DC

## 2022-02-27 MED ORDER — MEMANTINE HCL 10 MG PO TABS
ORAL_TABLET | ORAL | 11 refills | Status: DC
Start: 2022-02-27 — End: 2022-07-10

## 2022-02-27 NOTE — Progress Notes (Signed)
? ?NEUROLOGY FOLLOW UP OFFICE NOTE ? ?Brandon Nielsen ?[id number] ?[birth year] ? ?HISTORY OF PRESENT ILLNESS: ?I had the pleasure of seeing Brandon Nielsen in follow-up in the neurology clinic on 02/27/2022.  The patient was last seen 6 months ago for early onset Alzheimer's dementia. He is again accompanied by his wife [name] who helps supplement the history today.  Records and images were personally reviewed where available.  He has been discharged from occupational therapy, he reports having a good experience. OT notes reviewed, on his last visit, he was noted to have continued significant visuospatial deficits and limitations with higher level executive functioning (planning, organizing, connecting information, transitioning, and self-monitoring), decreased processing speed, limitations with independence and participation in both BADLS and IADLs. He states his communication abilities are very good, which [name] agrees with. She notes similar visuospatial issues. She thinks he misses her face in a crowd, he walked past her paying at the counter one time. Another time he kept saying he could not find the seltzer in the fridge but it was right in front of him. He does not see his clothes in the closet or toothbrush on the sink. There have been instances of inappropriate dressing, he came out of the pool dressing room in his underwear. She cannot leave him home alone, one time she had to babysit her grandchildren and found out he got up from bed at night and broke glass, not knowing what to do next. They now have help at home once a week. He is having more OCD behaviors, he keeps buying toothpaste, deodorant, or lip balm, with several open at the same time. He is having more confusion at night, he has been napping in the afternoon and one time woke up, showered and got ready for work at [employer]. 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 However when asked to use the computer today, he states he is unable to do it. He is independent with dressing and bathing but may get confused dressing, which he states is rare and he does better with now. [name] reports he gets testy when trying to get his jacket on, he was trying to pu on a blanket one time. He is on Lexapro 20mg  daily. He had diarrhea which improved with stopping Donepezil, he is on Memantine 10mg  BID without side effects. No paranoia or hallucinations. He walks daily with his dog Charlie. BP initially elevated 143/105, no headaches, dizziness, focal numbness/tingling/weakness. Repeat BP was 130/82. ? ? ?History on Initial Assessment 03/02/2021: This is a pleasant 66 year old left-handed man with a history of GERD, presenting to establish care for early onset Alzheimer's disease. His wife 03/04/2021 is present to provide additional information. She started noticing confusion around 7-8 years ago, worse in the past year. She reports he has changed so much in his ability to function. She took over finances 2-3 years ago because he was having a harder time. She helps with medications and he takes them by himself, rarely making a mistake. He stopped driving a year ago. He is able to bathe independently but would put on clothes inside out. He underwent Neuropsychological testing with Dr. 71 in March 2021 with note of profound and striking visuospatial impairment including extremely low scores on measures of perceptual functioning and marked constructional apraxia. He also had difficulties with executive functioning. Speed of processing could not be assessed related  to extreme difficulty interacting with test forms visually. It was also noted that memory was relatively spared. 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  I personally reviewed  brain MRI without contrast done in 12/2019 and 12/2019 did not show interval change, there was moderate diffuse volume loss, slightly more pronounced in the bilateral parietal lobes, right greater than left. It was noted that atrophy pattern is not classic for PCA. He was evaluated by [facility] dementia specialist Dr. [name] in 01/2020 and it was felt that most likely underlying pathological process is Alzheimer's versus frontotemporal lobar degeneration. He was started on Donepezil which caused nausea and diarrhea, switched to Rivastigmine. This caused worse vomiting, weakness, so he resumed Donepezil 10mg  daily. He was last seen at [facility] in 09/2020. They have decided to stay locally for now and wanted to establish local care. ? ?His wife reports that cognitive issues are not noticeable with brief conversations. He is more confused at night. They have an aide coming once a week. His son has noticed he has not idea what time of the day it is without cues. She drives him to the office where his sister also works, he sits at his desk and does not do anything. 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 He says he works a 5-day work week. He notes mild depression but he is able to function and does not feel the need to take medication. His wife however states that he is pretty depressed and gets emotional. She shows a video where he has difficulty following instructions, trying to eat with his knife. He starts crying saying he is tired of pushing himself. Sleep is pretty good, no wandering behaviors. No paranoia or hallucinations.  ? ?Laboratory Data: ?Lab Results  ?Component Value Date  ? TSH 0.83 11/17/2019  ? ?Lab Results  ?Component Value Date  ? 01/15/2020 228 11/17/2019  ? ?moderate diffuse parenchymal ?volume loss, slightly more pronounced in the bilateral parietal ?lobes, right greater than left, more pronounced  than expected for ?age, stable when compared to prior MRI. ? ?PAST MEDICAL HISTORY: ?Past Medical History:  ?Diagnosis Date  ? Chicken pox   ? as a child  ? Fuch's endothelial dystrophy   ? GERD (gastroesophageal reflux disease)   ? off meds - after loosing weight  ? H/O infectious mononucleosis   ? in college  ? Hepatitis   ? ? ?MEDICATIONS: ?Current Outpatient Medications on File Prior to Visit  ?Medication Sig Dispense Refill  ? Cholecalciferol (VITAMIN D3) 10 MCG (400 UNIT) tablet Take 400 Units by mouth daily.    ? donepezil (ARICEPT) 10 MG tablet Take 1 tablet (10 mg total) by mouth at bedtime. 30 tablet 11  ? escitalopram (LEXAPRO) 20 MG tablet Take 1 tablet (20 mg total) by mouth daily. 30 tablet 11  ? ipratropium (ATROVENT) 0.06 % nasal spray Place 1 spray into each nostril 4 (four) times daily. 15 mL 1  ? memantine (NAMENDA) 10 MG tablet Take 1 tablet every night for 2 weeks, then increase to 1 tablet twice a day 60 tablet 11  ? prednisoLONE acetate (PRED FORTE) 1 % ophthalmic suspension     ? prednisoLONE acetate (PRED MILD) 0.12 % ophthalmic suspension Place 1 drop into both eyes  daily.    ? RABEprazole (ACIPHEX) 20 MG tablet TAKE ONE TABLET BY MOUTH ONCE DAILY 90 tablet 0  ? ranitidine (ZANTAC) 150 MG capsule Take 150 mg by mouth as needed for heartburn.    ? ?No current facility-administered medications on file prior to visit.  ? ? ?ALLERGIES: ?No Known Allergies ? ?FAMILY HISTORY: ?Family History  ?Problem Relation Age of Onset  ? Cancer Mother   ?     cervical cancer/non-hodgikins lymphoma  ? Hypertension Mother   ? Colon polyps Mother   ? Heart disease Father   ? Hypertension Father   ? ? ?SOCIAL HISTORY: ?Social History  ? ?Socioeconomic History  ? Marital status: Married  ?  Spouse name: Not on file  ? Number of children: 3  ? Years of education: 3219  ? Highest education level: Not on file  ?Occupational History  ? Occupation: Clinical research associatelawyer  ?  Employer: [employer]  ?Tobacco Use  ? Smoking status:  Never  ? Smokeless tobacco: Never  ?Vaping Use  ? Vaping Use: Never used  ?Substance and Sexual Activity  ? Alcohol use: Yes  ?  Alcohol/week: 8.0 - 10.0 standard drinks  ?  Types: 8 - 10 Standard drinks

## 2022-02-27 NOTE — Patient Instructions (Signed)
Good to see you. ? ?Increase Lexapro 20mg : Take 1 and 1/2 tablets daily ? ?2. Continue Memantine 10mg  twice a day ? ?3. Set-up appointment for therapy with our social worker ? ?4. Recommend starting a day program with [facility] ? ?5. Follow-up in 6 months, call for any changes ? ? ?FALL PRECAUTIONS: Be cautious when walking. Scan the area for obstacles that may increase the risk of trips and falls. When getting up in the mornings, sit up at the edge of the bed for a few minutes before getting out of bed. Consider elevating the bed at the head end to avoid drop of blood pressure when getting up. Walk always in a well-lit room (use night lights in the walls). Avoid area rugs or power cords from appliances in the middle of the walkways. Use a walker or a cane if necessary and consider physical therapy for balance exercise. Get your eyesight checked regularly. ? ?HOME SAFETY: Consider the safety of the kitchen when operating appliances like stoves, microwave oven, and blender. Consider having supervision and share cooking responsibilities until no longer able to participate in those. Accidents with firearms and other hazards in the house should be identified and addressed as well. ? ?ABILITY TO BE LEFT ALONE: If patient is unable to contact 911 operator, consider using LifeLine, or when the need is there, arrange for someone to stay with patients. Smoking is a fire hazard, consider supervision or cessation. Risk of wandering should be assessed by caregiver and if detected at any point, supervision and safe proof recommendations should be instituted. ? ? ?RECOMMENDATIONS FOR ALL PATIENTS WITH MEMORY PROBLEMS: ?1. Continue to exercise (Recommend 30 minutes of walking everyday, or 3 hours every week) ?2. Increase social interactions - continue going to [place] and enjoy social gatherings with friends and family ?3. Eat healthy, avoid fried foods and eat more fruits and vegetables ?4. Maintain adequate blood pressure, blood  sugar, and blood cholesterol level. Reducing the risk of stroke and cardiovascular disease also helps promoting better memory. ?5. Avoid stressful situations. Live a simple life and avoid aggravations. Organize your time and prepare for the next day in anticipation. ?6. Sleep well, avoid any interruptions of sleep and avoid any distractions in the bedroom that may interfere with adequate sleep quality ?7. Avoid sugar, avoid sweets as there is a strong link between excessive sugar intake, diabetes, and cognitive impairment ?The Mediterranean diet has been shown to help patients reduce the risk of progressive memory disorders and reduces cardiovascular risk. This includes eating fish, eat fruits and green leafy vegetables, nuts like almonds and hazelnuts, walnuts, and also use olive oil. Avoid fast foods and fried foods as much as possible. Avoid sweets and sugar as sugar use has been linked to worsening of memory function. ? ?There is always a concern of gradual progression of memory problems. If this is the case, then we may need to adjust level of care according to patient needs. Support, both to the patient and caregiver, should then be put into place. ? ?

## 2022-03-04 ENCOUNTER — Ambulatory Visit (INDEPENDENT_AMBULATORY_CARE_PROVIDER_SITE_OTHER): Payer: Medicare Other | Admitting: Licensed Clinical Social Worker

## 2022-03-04 DIAGNOSIS — F4321 Adjustment disorder with depressed mood: Secondary | ICD-10-CM

## 2022-03-04 NOTE — BH Specialist Note (Signed)
Integrated Behavioral Health Initial In-Person Visit  MRN: [id number] Name: Brandon Nielsen [facility]  Number of Integrated Behavioral Health Clinician visits: 1- Initial Visit  Session Start time: [phone]    Session End time: 1050  Total time in minutes: 55   Types of Service: Individual psychotherapy  Interpretor:No. Interpretor Name and Language: NA    Warm Hand Off Completed.         Subjective: Brandon Nielsen is a 66 y.o. male accompanied by  Assistant  Patient was referred by Dr. Patrcia Dolly for Early onset Alzheimer's . Patient reports the following symptoms/concerns: Feelings of frustration at times with loss of independence, coming to terms to role transition .  Duration of problem: Several months; Severity of problem: moderate  Objective: Mood: Depressed and Affect: Appropriate Risk of harm to self or others: No plan to harm self or others  Life Context: Family and Social: Pt resides with his wife who is his primary caregiver. 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  Self-Care: Pt is unable to drive and is able to complete most ADL's with prompting  Life Changes: Pt has memory cognitive impairment with early onset Alzheimer's   Patient and/or Family's Strengths/Protective Factors: Concrete supports in place (healthy food, safe environments, etc.)  Goals Addressed: Patient will: Reduce symptoms of: depression and mood instability Increase knowledge and/or ability of: coping skills  Demonstrate ability to: Increase healthy adjustment to current life circumstances  Progress towards Goals: Ongoing  Interventions: Interventions utilized: Motivational Interviewing, Solution-Focused Strategies, and Supportive Counseling  Standardized Assessments completed: PHQ 2  Patient and/or Family Response: Pt open to supportive counseling .  Assisted in resolving some negative feelings associated with the  impact of the memory loss and look at what has worked, been helpful and lessons provided .    Patient Centered Plan: Patient is on the following Treatment Plan(s):  Looking at keeping dialogue lines of communication with family and primary caregiver open to discuss feelings including frustration, sadness but also what has worked, what has not worked and what each other can do to make a difference to support each other through this transition. Continue to do purposeful work and what provides meaning , look at what are weekly or daily frustrations and what are possible ways of resolving those with visual , audio  aids or other tools.    Assessment: Patient currently experiencing Feelings of sadness and frustration due to memory impairment and ramification  .   Patient may benefit from Resolving some of the negative emotional feelings associated with the impact of the memory loss and look at what has worked what has been beneficial . Continue to do purposeful work and activities that provide joy and meaning.  Keeping dialogue between Pt and primary caregiver open about feelings on both sides open weekly check ins what has worked , what has not, what changes need to be made and how they can support one another through life role transition.    Plan: Follow up with behavioral health clinician on : Three weeks  Behavioral recommendations: Continue to do purposeful work and activities that provide joy and meaning.  Keeping dialogue between Pt and primary caregiver open about feelings on both sides open weekly check ins what has worked , what has not, what changes need to be made and how they can support one another through life role transition.   Referral(s):  NA "From scale of 1-10, how  likely are you to follow plan?": 9  Tonyia Marschall A Taylor-Paladino, LCSW

## 2022-03-25 ENCOUNTER — Ambulatory Visit: Payer: Medicare Other | Admitting: Licensed Clinical Social Worker

## 2022-03-26 ENCOUNTER — Ambulatory Visit: Payer: Medicare Other | Admitting: Licensed Clinical Social Worker

## 2022-04-04 ENCOUNTER — Ambulatory Visit: Payer: Medicare Other | Admitting: Licensed Clinical Social Worker

## 2022-04-05 ENCOUNTER — Other Ambulatory Visit: Payer: Self-pay | Admitting: Internal Medicine

## 2022-05-01 ENCOUNTER — Other Ambulatory Visit: Payer: Self-pay | Admitting: Internal Medicine

## 2022-05-15 ENCOUNTER — Other Ambulatory Visit: Payer: Self-pay | Admitting: Internal Medicine

## 2022-06-12 ENCOUNTER — Ambulatory Visit (INDEPENDENT_AMBULATORY_CARE_PROVIDER_SITE_OTHER): Payer: Medicare Other | Admitting: Neurology

## 2022-06-12 ENCOUNTER — Encounter: Payer: Self-pay | Admitting: Neurology

## 2022-06-12 VITALS — BP 138/96 | HR 62 | Ht 75.0 in | Wt 217.6 lb

## 2022-06-12 DIAGNOSIS — K529 Noninfective gastroenteritis and colitis, unspecified: Secondary | ICD-10-CM

## 2022-06-12 DIAGNOSIS — F02818 Dementia in other diseases classified elsewhere, unspecified severity, with other behavioral disturbance: Secondary | ICD-10-CM

## 2022-06-12 DIAGNOSIS — G300 Alzheimer's disease with early onset: Secondary | ICD-10-CM | POA: Diagnosis not present

## 2022-06-12 NOTE — Patient Instructions (Signed)
Good to see you.  Stop the Namenda (Memantine) for 2 weeks. If no change in diarrhea, restart Memantine 10mg  twice a day.  2. If no change with diarrhea off the Namenda, we will wean off the Lexapro: reduce to 1 tablet daily for 3 days, then stop medication for 2 weeks. If again no change in diarrhea, recommend evaluation with a Gastroenterologist  3. Follow-up in 3 months, call for any changes.

## 2022-06-12 NOTE — Progress Notes (Signed)
NEUROLOGY FOLLOW UP OFFICE NOTE  Jamesryan Krammer [id number] Mar 09, 1956  HISTORY OF PRESENT ILLNESS: I had the pleasure of seeing Regniald Yeiser in follow-up in the neurology clinic on 06/12/2022.  The patient was last seen 3 months ago for early onset Alzheimer's dementia. He is accompanied by his son Mitzi Hansen who helps supplement the history today. MMSE 20/30 in May 2023. He is on Memantine 10mg  BID. He had diarrhea on Donepezil, vomiting on Rivastigmine. He is on Lexapro 30mg  daily. He presents for an earlier visit due to chronic diarrhea and expressed concerns it is medication-related. He has had it off and on but it is now affecting his quality of life. It seems to usually occur after eating. They were hiking recently and he had bowel incontinence. He wore Depends and had to clean up. Mitzi Hansen reports the incontinence is rare but has happened. He denies any nausea/vomiting, abdominal pain, back pain, focal numbness/tingling/weakness, no falls. He states Alzheimer's is "no fun" but grateful he can communicate without difficulties. Mitzi Hansen will be taking over with overseeing his medical care, they have been looking into 24/7 care versus assisted living at [facility].    History on Initial Assessment 03/02/2021: This is a pleasant 66 year old left-handed man with a history of GERD, presenting to establish care for early onset Alzheimer's disease. His [name] is present to provide additional information. She started noticing confusion around 7-8 years ago, worse in the past year. She reports he has changed so much in his ability to function. She took over finances 2-3 years ago because he was having a harder time. She helps with medications and he takes them by himself, rarely making a mistake. He stopped driving a year ago. He is able to bathe independently but would put on clothes inside out. He underwent Neuropsychological testing with Dr. Nicole Kindred in March 2021 with note of profound and striking  visuospatial impairment including extremely low scores on measures of perceptual functioning and marked constructional apraxia. He also had difficulties with executive functioning. Speed of processing could not be assessed related to extreme difficulty interacting with test forms visually. It was also noted that memory was relatively spared. 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  I personally reviewed brain MRI without contrast done in 12/2019 and 12/2019 did not show interval change, there was moderate diffuse volume loss, slightly more pronounced in the bilateral parietal lobes, right greater than left. It was noted that atrophy pattern is not classic for PCA. He was evaluated by [facility] dementia specialist Dr. Werner Lean in 01/2020 and it was felt that most likely underlying pathological process is Alzheimer's versus frontotemporal lobar degeneration. He was started on Donepezil which caused nausea and diarrhea, switched to Rivastigmine. This caused worse vomiting, weakness, so he resumed Donepezil 10mg  daily. He was last seen at [facility] in 09/2020. They have decided to stay locally for now and wanted to establish local care.  His wife reports that cognitive issues are not noticeable with brief conversations. He is more confused at night. They have an aide coming once a week. His son has noticed he has not idea what time of the day it is without cues. She drives him to the office where his sister also works, he sits at his desk and does not do anything. 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 He says he works a 5-day work week. He notes mild depression but he is able to function and does not feel the need to take medication. His wife however states that he is pretty depressed  and gets emotional. She shows a video where he has difficulty following instructions, trying to eat with his knife. He starts crying saying he is tired of pushing himself. Sleep is pretty good, no wandering behaviors. No paranoia or hallucinations.   Laboratory Data: Lab Results  Component Value Date   TSH 0.83 11/17/2019   Lab Results  Component Value Date   VITAMINB12 228 11/17/2019   Repeat MRI brain without contrast 12/2020 showed moderate diffuse parenchymal volume loss, slightly more pronounced in the bilateral parietal lobes, right greater than left, more pronounced than expected for age, stable when compared to prior MRI in 2021.  PAST MEDICAL HISTORY: Past Medical History:  Diagnosis Date   Chicken pox    as a child   Fuch's endothelial dystrophy    GERD (gastroesophageal reflux disease)    off meds - after loosing weight   H/O infectious mononucleosis    in college   Hepatitis     MEDICATIONS: Current Outpatient Medications on File Prior to Visit  Medication Sig Dispense Refill   Cholecalciferol (VITAMIN D3) 10 MCG (400 UNIT) tablet Take 400 Units by mouth daily.     escitalopram (LEXAPRO) 20 MG tablet Take 1 and 1/2 tablet daily 45 tablet 11   ipratropium (ATROVENT) 0.06 % nasal spray INSTILL ONE SPRAY IN EACH NOSTRIL FOUR TIMES DAILY 15 mL 1   memantine (NAMENDA) 10 MG tablet Take 1 tablet twice a day 60 tablet 11   prednisoLONE acetate (PRED FORTE) 1 % ophthalmic suspension      prednisoLONE acetate (PRED MILD) 0.12 % ophthalmic suspension Place 1 drop into both eyes daily.     RABEprazole (ACIPHEX) 20 MG tablet Take 1 tablet (20 mg total) by mouth daily. Annual appt due in NOV must see provider for future refills 90 tablet 1   No current facility-administered medications on file prior to visit.    ALLERGIES: No Known Allergies  FAMILY HISTORY: Family History  Problem Relation Age of Onset   Cancer Mother        cervical cancer/non-hodgikins lymphoma    Hypertension Mother    Colon polyps Mother    Heart disease Father    Hypertension Father     SOCIAL HISTORY: Social History   Socioeconomic History   Marital status: Married    Spouse name: Not on file   Number of children: 3   Years of education: 5   Highest education level: Not on file  Occupational History   Occupation: Scientist, research (medical): [employer]  Tobacco Use   Smoking status: Never   Smokeless tobacco: Never  Vaping Use   Vaping Use: Never used  Substance and Sexual Activity   Alcohol use: Yes    Alcohol/week: 8.0 - 10.0 standard drinks of alcohol    Types: 8 - 10 Standard drinks or equivalent per week    Comment: occ   Drug use: No   Sexual activity: Yes    Partners: Female  Other Topics Concern   Not on file  Social History Narrative   HSG, [place]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 Married '77- 13/divorced. Married '95.  3 adult children - '82, '  52, '86 (step daughter). Work - TEFL teacher.    Left handed    One story   Hot tea and coffee             Social Determinants of Health   Financial Resource Strain: Not on file  Food Insecurity: Not on file  Transportation Needs: Not on file  Physical Activity: Not on file  Stress: Not on file  Social Connections: Not on file  Intimate Partner Violence: Not on file     PHYSICAL EXAM: Vitals:   06/12/22 0936  BP: (!) 138/96  Pulse: 62  SpO2: 98%   General: No acute distress Head:  Normocephalic/atraumatic Skin/Extremities: No rash, no edema Neurological Exam: alert and awake. No aphasia or dysarthria. Fund of knowledge is appropriate.  Attention and concentration are normal.   Cranial nerves: Pupils equal, round. Extraocular movements intact with no nystagmus. He has neglect/extinction on the left visual field to double simultaneous stimulation. No facial asymmetry.  Motor: Bulk and tone normal, muscle strength 5/5 throughout with no pronator drift.   Finger to  nose testing intact.  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 He presents for an earlier visit concerned that his diarrhea is due to medication. We discussed doing a drug holiday first of Memantine for 2 weeks, and if no change in diarrhea, restart Memantine then wean off Lexapro and monitor symptoms. If no change in diarrhea off medications, recommend GI evaluation. We discussed need for increased supervision, they are looking into 24/7 home care or assisted living. He does not drive. Follow-up in 3 months, they know to call for any changes.    Thank you for allowing me to participate in his care.  Please do not hesitate to call for any questions or concerns.    Patrcia Dolly, M.D.   CC: Dr. Okey Dupre

## 2022-07-10 ENCOUNTER — Ambulatory Visit (INDEPENDENT_AMBULATORY_CARE_PROVIDER_SITE_OTHER): Payer: Medicare Other | Admitting: Internal Medicine

## 2022-07-10 ENCOUNTER — Encounter: Payer: Self-pay | Admitting: Internal Medicine

## 2022-07-10 VITALS — BP 132/86 | HR 55 | Ht 76.0 in | Wt 220.0 lb

## 2022-07-10 DIAGNOSIS — K219 Gastro-esophageal reflux disease without esophagitis: Secondary | ICD-10-CM | POA: Diagnosis not present

## 2022-07-10 DIAGNOSIS — G300 Alzheimer's disease with early onset: Secondary | ICD-10-CM

## 2022-07-10 DIAGNOSIS — R9431 Abnormal electrocardiogram [ECG] [EKG]: Secondary | ICD-10-CM

## 2022-07-10 DIAGNOSIS — I4891 Unspecified atrial fibrillation: Secondary | ICD-10-CM | POA: Diagnosis not present

## 2022-07-10 DIAGNOSIS — R197 Diarrhea, unspecified: Secondary | ICD-10-CM

## 2022-07-10 DIAGNOSIS — Z23 Encounter for immunization: Secondary | ICD-10-CM | POA: Diagnosis not present

## 2022-07-10 DIAGNOSIS — Z13220 Encounter for screening for lipoid disorders: Secondary | ICD-10-CM

## 2022-07-10 DIAGNOSIS — F0280 Dementia in other diseases classified elsewhere without behavioral disturbance: Secondary | ICD-10-CM | POA: Diagnosis not present

## 2022-07-10 LAB — LIPID PANEL
Cholesterol: 182 mg/dL (ref 0–200)
HDL: 64.5 mg/dL (ref >39.00)
LDL Cholesterol: 95 mg/dL (ref 0–99)
NonHDL: 117.81
Total CHOL/HDL Ratio: 3
Triglycerides: 116 mg/dL (ref 0.0–149.0)
VLDL: 23.2 mg/dL (ref 0.0–40.0)

## 2022-07-10 LAB — CBC
HCT: 45.1 % (ref 39.0–52.0)
Hemoglobin: 15.2 g/dL (ref 13.0–17.0)
MCHC: 33.6 g/dL (ref 30.0–36.0)
MCV: 90.8 fl (ref 78.0–100.0)
Platelets: 214 10*3/uL (ref 150.0–400.0)
RBC: 4.97 Mil/uL (ref 4.22–5.81)
RDW: 14 % (ref 11.5–15.5)
WBC: 8.1 10*3/uL (ref 4.0–10.5)

## 2022-07-10 LAB — COMPREHENSIVE METABOLIC PANEL
ALT: 19 U/L (ref 0–53)
AST: 19 U/L (ref 0–37)
Albumin: 4.1 g/dL (ref 3.5–5.2)
Alkaline Phosphatase: 85 U/L (ref 39–117)
BUN: 18 mg/dL (ref 6–23)
CO2: 31 mEq/L (ref 19–32)
Calcium: 9.1 mg/dL (ref 8.4–10.5)
Chloride: 104 mEq/L (ref 96–112)
Creatinine, Ser: 1.02 mg/dL (ref 0.40–1.50)
GFR: 75.92 mL/min (ref >60.00)
Glucose, Bld: 76 mg/dL (ref 70–99)
Potassium: 4.5 mEq/L (ref 3.5–5.1)
Sodium: 139 mEq/L (ref 135–145)
Total Bilirubin: 0.8 mg/dL (ref 0.2–1.2)
Total Protein: 7 g/dL (ref 6.0–8.3)

## 2022-07-10 LAB — TSH: TSH: 0.77 u[IU]/mL (ref 0.35–5.50)

## 2022-07-10 NOTE — Progress Notes (Signed)
   Subjective:   Patient ID: Brandon Nielsen, male    DOB: 1956-03-25, 66 y.o.   MRN: [id number]  HPI The patient is a 66 YO man coming in for follow up. No new concerns. Diarrhea improved off namenda.  Review of Systems  Objective:  Physical Exam  Vitals:   07/10/22 1012  BP: 132/86  Pulse: (!) 55  SpO2: 97%  Weight: 220 lb (99.8 kg)  Height: 6\' 4"  (1.93 m)   EKG: Rate 44, axis normal, interval normal, new A fib, no st or t wave changes, there is  significant change compared to prior with new A fib   Assessment & Plan:  Flu shot given at visit

## 2022-07-10 NOTE — Patient Instructions (Addendum)
We will check the labs today and have given the flu shot.  You can get a covid-19 booster at the pharmacy.  We have done an EKG today which does show new A fib. We will get you in with a cardiologist to talk to them and will have you start taking an aspirin 81 mg daily to help.

## 2022-07-11 DIAGNOSIS — I4891 Unspecified atrial fibrillation: Secondary | ICD-10-CM | POA: Insufficient documentation

## 2022-07-11 DIAGNOSIS — I480 Paroxysmal atrial fibrillation: Secondary | ICD-10-CM | POA: Insufficient documentation

## 2022-07-11 NOTE — Assessment & Plan Note (Signed)
Almost entirely resolved off namenda.

## 2022-07-11 NOTE — Assessment & Plan Note (Addendum)
Well controlled on aciphex 20 mg daily. Will continue lifelong.

## 2022-07-11 NOTE — Assessment & Plan Note (Signed)
EKG done to update as last 2017. There is a clear loss of p waves from then to now. He is bradycardic so starting a beta blocker not indicated. He is asymptomatic. CHADs-VASC 1 so advised to start taking aspirin 81 mg daily and referral to cardiology to assess for cardiac cause such as CAD to have triggered. Checking CBC, CMP, TSH, lipid panel today.

## 2022-07-11 NOTE — Assessment & Plan Note (Signed)
Is currently off medication and stable. He did have diarrhea with aricept and namenda. Sees neurology every 3 months.

## 2022-08-08 ENCOUNTER — Encounter: Payer: Self-pay | Admitting: Internal Medicine

## 2022-08-08 ENCOUNTER — Ambulatory Visit: Payer: Medicare Other | Attending: Internal Medicine | Admitting: Internal Medicine

## 2022-08-08 VITALS — BP 136/88 | HR 69 | Ht 76.0 in | Wt 227.0 lb

## 2022-08-08 DIAGNOSIS — G4733 Obstructive sleep apnea (adult) (pediatric): Secondary | ICD-10-CM | POA: Insufficient documentation

## 2022-08-08 DIAGNOSIS — I4891 Unspecified atrial fibrillation: Secondary | ICD-10-CM | POA: Insufficient documentation

## 2022-08-08 DIAGNOSIS — G300 Alzheimer's disease with early onset: Secondary | ICD-10-CM | POA: Insufficient documentation

## 2022-08-08 DIAGNOSIS — F0280 Dementia in other diseases classified elsewhere without behavioral disturbance: Secondary | ICD-10-CM | POA: Insufficient documentation

## 2022-08-08 DIAGNOSIS — Z0289 Encounter for other administrative examinations: Secondary | ICD-10-CM

## 2022-08-08 MED ORDER — ASPIRIN 81 MG PO TBEC: 81.0000 mg | DELAYED_RELEASE_TABLET | Freq: Every day | ORAL | 12 refills | Status: AC

## 2022-08-08 NOTE — Progress Notes (Signed)
Cardiology Office Note:    Date:  08/08/2022   ID:  Bertram, Haddix Jun 16, 1956, MRN [id number]  PCP:  Hoyt Koch, MD   [facility] Cardiologist:  Werner Lean, MD     Referring MD: Hoyt Koch, *   CC: asymptomatic atrial fibrillation Consulted for the evaluation of PAF at the behest of Dr. Sharlet Salina  History of Present Illness:    Brandon Nielsen is a 66 y.o. male with a hx of new paroxsymal atrial fibrillation, OSA, and Alzhemiers' dementia, prior ILR, who presents for evaluation.Prior heart monitoring in 2014 through [facility].  Patient notes that he is feeling well.   Has early onset Azlheimers diagnosis but is still working.  On no therapies for this. Still works as a Chief Executive Officer.  Able to take his dog for a walk.    Has had no chest pain, chest pressure, chest tightness, chest stinging .  No shortness of breath, DOE .  No PND or orthopnea.  No weight gain, leg swelling , or abdominal swelling.  No syncope or near syncope . Notes  no palpitations or funny heart beats ( he is in Afib right now).   Past Medical History:  Diagnosis Date   Chicken pox    as a child   Fuch's endothelial dystrophy    GERD (gastroesophageal reflux disease)    off meds - after loosing weight   H/O infectious mononucleosis    in college   Hepatitis     Past Surgical History:  Procedure Laterality Date   COLONOSCOPY     CORNEAL TRANSPLANT  2014   Bil eyes   NASAL SEPTUM SURGERY     at 31 yrs of age    Current Medications: Current Meds  Medication Sig   aspirin EC 81 MG tablet Take 1 tablet (81 mg total) by mouth daily. Swallow whole.   escitalopram (LEXAPRO) 20 MG tablet Take 1 and 1/2 tablet daily   ipratropium (ATROVENT) 0.06 % nasal spray INSTILL ONE SPRAY IN EACH NOSTRIL FOUR TIMES DAILY   RABEprazole (ACIPHEX) 20 MG tablet Take 1 tablet (20 mg total) by mouth daily. Annual appt due in NOV must see provider for future refills      Allergies:   Patient has no known allergies.   Social History   Socioeconomic History   Marital status: Married    Spouse name: Not on file   Number of children: 3   Years of education: 78   Highest education level: Not on file  Occupational History   Occupation: lawyer    Employer: [employer]  Tobacco Use   Smoking status: Never   Smokeless tobacco: Never  Vaping Use   Vaping Use: Never used  Substance and Sexual Activity   Alcohol use: Yes    Alcohol/week: 8.0 - 10.0 standard drinks of alcohol    Types: 8 - 10 Standard drinks or equivalent per week    Comment: occ   Drug use: No   Sexual activity: Yes    Partners: Female  Other Topics Concern   Not on file  Social History Narrative   HSG, [place]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 Married '77- 13/divorced. Married '95.  3 adult children - '82, '85, '86 (step daughter). Work - Research scientist (physical sciences).    Left handed    One story   Hot tea and coffee  Social Determinants of Health   Financial Resource Strain: Not on file  Food Insecurity: Not on file  Transportation Needs: Not on file  Physical Activity: Not on file  Stress: Not on file  Social Connections: Not on file    Social: comes with wife [name], has a dog and grandchildren, son was a former Psychologist, clinical  Family History: The patient's family history includes Cancer in his mother; Colon polyps in his mother; Heart disease in his father; Hypertension in his father and mother. Afib in sister and partents.   ROS:   Please see the history of present illness.     All other systems reviewed and are negative.  EKGs/Labs/Other Studies Reviewed:    The following studies were reviewed today:  EKG:  EKG is  ordered today.  The ekg ordered today demonstrates  08/08/22: Atrial fibrillation   Recent Labs: 07/10/2022: ALT 19; BUN 18; Creatinine, Ser 1.02; Hemoglobin 15.2; Platelets 214.0; Potassium 4.5; Sodium 139;  TSH 0.77  Recent Lipid Panel    Component Value Date/Time   CHOL 182 07/10/2022 1113   TRIG 116.0 07/10/2022 1113   HDL 64.50 07/10/2022 1113   CHOLHDL 3 07/10/2022 1113   VLDL 23.2 07/10/2022 1113   LDLCALC 95 07/10/2022 1113    Risk Assessment/Calculations:    CHA2DS2-VASc Score = 1   This indicates a 0.6% annual risk of stroke. The patient's score is based upon: CHF History: 0 HTN History: 0 Diabetes History: 0 Stroke History: 0 Vascular Disease History: 0 Age Score: 1 Gender Score: 0            Physical Exam:    VS:  BP 136/88   Pulse 69   Ht 6\' 4"  (1.93 m)   Wt 227 lb (103 kg)   SpO2 97%   BMI 27.63 kg/m     Wt Readings from Last 3 Encounters:  08/08/22 227 lb (103 kg)  07/10/22 220 lb (99.8 kg)  06/12/22 217 lb 9.6 oz (98.7 kg)    GEN:  Well nourished, well developed in no acute distress HEENT: Normal NECK: No JVD LYMPHATICS: No lymphadenopathy CARDIAC: IRIR, no murmurs, rubs, gallops RESPIRATORY:  Clear to auscultation without rales, wheezing or rhonchi  ABDOMEN: Soft, non-tender, non-distended MUSCULOSKELETAL:  trace R leg edema; No deformity  SKIN: Warm and dry NEUROLOGIC:  Alert and oriented x 3 PSYCHIATRIC:  Normal affect   ASSESSMENT:    1. Atrial fibrillation, unspecified type (HCC)   2. Obstructive sleep apnea   3. Early onset Alzheimer's dementia without behavioral disturbance (HCC)    PLAN:    Atrial Fibrillation NOS  Early onset Alzheimer disease Alcohol use OSA - CHADSVASC=1. - TSH normal - discussed alcohol; discussed exercise as preventive factors - Will get obtain TTE.  Has no falls; if LV dysfunction will discuss BB; DOAC, and DCCV  APP in three months Me in one year       Medication Adjustments/Labs and Tests Ordered: Current medicines are reviewed at length with the patient today.  Concerns regarding medicines are outlined above.  Orders Placed This Encounter  Procedures   EKG 12-Lead   ECHOCARDIOGRAM COMPLETE    Meds ordered this encounter  Medications   aspirin EC 81 MG tablet    Sig: Take 1 tablet (81 mg total) by mouth daily. Swallow whole.    Dispense:  30 tablet    Refill:  12    Patient Instructions  Medication Instructions:  Your physician has recommended you make the  following change in your medication:  START: Aspirin 81 mg by mouth once daily  *If you need a refill on your cardiac medications before your next appointment, please call your pharmacy*   Lab Work: NONE If you have labs (blood work) drawn today and your tests are completely normal, you will receive your results only by: MyChart Message (if you have MyChart) OR A paper copy in the mail If you have any lab test that is abnormal or we need to change your treatment, we will call you to review the results.   Testing/Procedures: Your physician has requested that you have an echocardiogram. Echocardiography is a painless test that uses sound waves to create images of your heart. It provides your doctor with information about the size and shape of your heart and how well your heart's chambers and valves are working. This procedure takes approximately one hour. There are no restrictions for this procedure. Please do NOT wear cologne, perfume, aftershave, or lotions (deodorant is allowed). Please arrive 15 minutes prior to your appointment time.    Follow-Up: At [facility], you and your health needs are our priority.  As part of our continuing mission to provide you with exceptional heart care, we have created designated Provider Care Teams.  These Care Teams include your primary Cardiologist (physician) and Advanced Practice Providers (APPs -  Physician Assistants and Nurse Practitioners) who all work together to provide you with the care you need, when you need it.  We recommend signing up for the patient portal called "MyChart".  Sign up information is provided on this After Visit Summary.  MyChart is used to  connect with patients for Virtual Visits (Telemedicine).  Patients are able to view lab/test results, encounter notes, upcoming appointments, etc.  Non-urgent messages can be sent to your provider as well.   To learn more about what you can do with MyChart, go to [email or web address].    Your next appointment:   3 month(s)  The format for your next appointment:   In Person  Provider:   Ronie Spies, PA-C, Robin Searing, NP, or Jacolyn Reedy, PA-C     Then, Christell Constant, MD will plan to see you again in 1 year(s).    Important Information About Sugar         Signed, Christell Constant, MD  08/08/2022 10:45 AM    [facility]

## 2022-08-08 NOTE — Patient Instructions (Signed)
Medication Instructions:  Your physician has recommended you make the following change in your medication:  START: Aspirin 81 mg by mouth once daily  *If you need a refill on your cardiac medications before your next appointment, please call your pharmacy*   Lab Work: NONE If you have labs (blood work) drawn today and your tests are completely normal, you will receive your results only by: [place] (if you have MyChart) OR A paper copy in the mail If you have any lab test that is abnormal or we need to change your treatment, we will call you to review the results.   Testing/Procedures: Your physician has requested that you have an echocardiogram. Echocardiography is a painless test that uses sound waves to create images of your heart. It provides your doctor with information about the size and shape of your heart and how well your heart's chambers and valves are working. This procedure takes approximately one hour. There are no restrictions for this procedure. Please do NOT wear cologne, perfume, aftershave, or lotions (deodorant is allowed). Please arrive 15 minutes prior to your appointment time.    Follow-Up: At [facility], you and your health needs are our priority.  As part of our continuing mission to provide you with exceptional heart care, we have created designated Provider Care Teams.  These Care Teams include your primary Cardiologist (physician) and Advanced Practice Providers (APPs -  Physician Assistants and Nurse Practitioners) who all work together to provide you with the care you need, when you need it.  We recommend signing up for the patient portal called "MyChart".  Sign up information is provided on this After Visit Summary.  MyChart is used to connect with patients for Virtual Visits (Telemedicine).  Patients are able to view lab/test results, encounter notes, upcoming appointments, etc.  Non-urgent messages can be sent to your provider as well.   To  learn more about what you can do with MyChart, go to [email or web address].    Your next appointment:   3 month(s)  The format for your next appointment:   In Person  Provider:   Melina Copa, PA-C, Ambrose Pancoast, NP, or Ermalinda Barrios, PA-C     Then, Werner Lean, MD will plan to see you again in 1 year(s).    Important Information About Sugar

## 2022-08-13 ENCOUNTER — Telehealth: Payer: Self-pay | Admitting: *Deleted

## 2022-08-13 NOTE — Telephone Encounter (Signed)
Fl2 form complete/sign/faxed [phone] attached medication list.

## 2022-08-19 ENCOUNTER — Telehealth: Payer: Self-pay | Admitting: Internal Medicine

## 2022-08-19 NOTE — Telephone Encounter (Signed)
Left message for patient to call back to schedule Medicare Annual Wellness Visit   Last AWV  08/27/21  Please schedule at anytime with LB Boyce if patient calls the office back.      Any questions, please call me at [phone] message for patient to call back to schedule Medicare Annual Wellness Visit

## 2022-08-27 ENCOUNTER — Ambulatory Visit (INDEPENDENT_AMBULATORY_CARE_PROVIDER_SITE_OTHER): Payer: Medicare Other

## 2022-08-27 DIAGNOSIS — I4891 Unspecified atrial fibrillation: Secondary | ICD-10-CM

## 2022-08-27 LAB — ECHOCARDIOGRAM COMPLETE
AV Vena cont: 2.11 cm
S' Lateral: 2.87 cm

## 2022-09-09 ENCOUNTER — Encounter: Payer: Self-pay | Admitting: Neurology

## 2022-09-10 ENCOUNTER — Ambulatory Visit: Payer: Medicare Other | Admitting: Neurology

## 2022-09-23 ENCOUNTER — Telehealth: Payer: Self-pay

## 2022-09-23 DIAGNOSIS — I517 Cardiomegaly: Secondary | ICD-10-CM

## 2022-09-23 DIAGNOSIS — I712 Thoracic aortic aneurysm, without rupture, unspecified: Secondary | ICD-10-CM

## 2022-09-23 NOTE — Telephone Encounter (Signed)
-----   Message from Christell Constant, MD sent at 09/01/2022  4:45 PM EST ----- Results: Severe LVH without history of long standing HTN Moderate TAA Plan: MRA and CMR in 6 months, me after  Christell Constant, MD

## 2022-09-23 NOTE — Telephone Encounter (Signed)
Left a message for pt to call back.  Need to verbally go over instructions for Cardiac MRI/MRA.  Instruction letter pending.  Advised to call our office  to schedule labs needed prior to testing due in May 2024 and review instructions. Orders for Cmri/ MRA and labs placed.

## 2022-10-10 ENCOUNTER — Encounter: Payer: Self-pay | Admitting: Neurology

## 2022-10-10 NOTE — Telephone Encounter (Signed)
Second attempt at reaching pt to review Cardiac MRI instructions.

## 2022-10-11 ENCOUNTER — Encounter: Payer: Self-pay | Admitting: Neurology

## 2022-10-11 ENCOUNTER — Ambulatory Visit (INDEPENDENT_AMBULATORY_CARE_PROVIDER_SITE_OTHER): Payer: Medicare Other | Admitting: Neurology

## 2022-10-11 VITALS — BP 130/81 | HR 61 | Ht 76.0 in | Wt 237.4 lb

## 2022-10-11 DIAGNOSIS — F02818 Dementia in other diseases classified elsewhere, unspecified severity, with other behavioral disturbance: Secondary | ICD-10-CM | POA: Diagnosis not present

## 2022-10-11 DIAGNOSIS — G300 Alzheimer's disease with early onset: Secondary | ICD-10-CM

## 2022-10-11 MED ORDER — ESCITALOPRAM OXALATE 20 MG PO TABS: ORAL_TABLET | ORAL | 11 refills | Status: DC

## 2022-10-11 NOTE — Progress Notes (Signed)
NEUROLOGY FOLLOW UP OFFICE NOTE  Brandon Nielsen [id number] 21-Dec-1955  HISTORY OF PRESENT ILLNESS: I had the pleasure of seeing Brandon Nielsen in follow-up in the neurology clinic on 10/11/2022.  The patient was last seen 4 months ago for early onset Alzheimer's dementia. He is again accompanied by his wife Brandon Nielsen who helps supplement the history today.  Records and images were personally reviewed where available. On his last visit, they expressed concern about diarrhea due to Memantine and/or Lexapro. We discussed doing a drug holiday, they report resolution of diarrhea with discontinuation of Memantine. He continues on Lexapro 30mg  daily (20mg  1 and 1/2 tabs daily) without side effects. His wife contacted our office last month to report that he is no longer going to his office and is home with 24/7 care. Yesterday she sent an update that she spoke to staff at [facility] and they are thinking he will need to begin his transition to live in Assisted Living in late January/early February. He has an apartment and goes to their gym twice a week. He has 24/7 aides administering medications, managing meals. His wife manages finances. He states he does not need much help with dressing and bathing, [facility] nods behind that he does. She reports more difficulties with executive functioning, staff would put pills in one hand and give a glass of water in the other hand, and he poured the water on his other hand. Another time he took pills and tried to drink out of the side of the glass. He now has an automatic pill dispenser with an alarm, he takes long naps and would get confused when waking up, thinking it was time to take his medications. Sleep at night is good. No hallucinations or paranoia but there is more confusion with some issues, more agitation. His desire for sweets has gone "off the charts."    History on Initial Assessment 03/02/2021: This is a pleasant 66 year old left-handed man with a history of  GERD, presenting to establish care for early onset Alzheimer's disease. His wife 03/04/2021 is present to provide additional information. She started noticing confusion around 7-8 years ago, worse in the past year. She reports he has changed so much in his ability to function. She took over finances 2-3 years ago because he was having a harder time. She helps with medications and he takes them by himself, rarely making a mistake. He stopped driving a year ago. He is able to bathe independently but would put on clothes inside out. He underwent Neuropsychological testing with Dr. 71 in March 2021 with note of profound and striking visuospatial impairment including extremely low scores on measures of perceptual functioning and marked constructional apraxia. He also had difficulties with executive functioning. Speed of processing could not be assessed related to extreme difficulty interacting with test forms visually. It was also noted that memory was relatively spared. 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  I personally reviewed brain MRI without contrast done in 12/2019 and 12/2019 did not show interval change, there was moderate diffuse volume loss, slightly more pronounced in the bilateral parietal lobes, right greater than left. It was noted that atrophy pattern is not classic for PCA. He was evaluated by [facility] dementia specialist Dr. 01/2020 in 01/2020 and it was felt that most likely underlying pathological process is Alzheimer's versus frontotemporal lobar degeneration. He was started on Donepezil which caused  nausea and diarrhea, switched to Rivastigmine. This caused worse vomiting, weakness, so he resumed Donepezil 10mg  daily. He was last seen at [facility] in 09/2020. They have decided to stay locally for now and wanted to establish local care.  His wife reports that cognitive  issues are not noticeable with brief conversations. He is more confused at night. They have an aide coming once a week. His son has noticed he has not idea what time of the day it is without cues. She drives him to the office where his sister also works, he sits at his desk and does not do anything. 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 He says he works a 5-day work week. He notes mild depression but he is able to function and does not feel the need to take medication. His wife however states that he is pretty depressed and gets emotional. She shows a video where he has difficulty following instructions, trying to eat with his knife. He starts crying saying he is tired of pushing himself. Sleep is pretty good, no wandering behaviors. No paranoia or hallucinations.   Laboratory Data: Lab Results  Component Value Date   TSH 0.83 11/17/2019   Lab Results  Component Value Date   VITAMINB12 228 11/17/2019   Repeat MRI brain without contrast 12/2020 showed moderate diffuse parenchymal volume loss, slightly more pronounced in the bilateral parietal lobes, right greater than left, more pronounced than expected for age, stable when compared to prior MRI in 2021.   PAST MEDICAL HISTORY: Past Medical History:  Diagnosis Date   Chicken pox    as a child   Fuch's endothelial dystrophy    GERD (gastroesophageal reflux disease)    off meds - after loosing weight   H/O infectious mononucleosis    in college   Hepatitis     MEDICATIONS: Current Outpatient Medications on File Prior to Visit  Medication Sig Dispense Refill   aspirin EC 81 MG tablet Take 1 tablet (81 mg total) by mouth daily. Swallow whole. 30 tablet 12   escitalopram (LEXAPRO) 20 MG tablet Take 1 and 1/2 tablet daily 45 tablet 11   ipratropium (ATROVENT) 0.06 % nasal spray INSTILL ONE SPRAY IN EACH NOSTRIL FOUR TIMES DAILY 15  mL 1   RABEprazole (ACIPHEX) 20 MG tablet Take 1 tablet (20 mg total) by mouth daily. Annual appt due in NOV must see provider for future refills 90 tablet 1   No current facility-administered medications on file prior to visit.    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 Married '77- 13/divorced. Married '95.  3 adult children - '82, '85, '86 (step daughter). Work - TEFL teacher.    Left handed    One story   Hot tea and coffee             Social Determinants of Health   Financial Resource Strain: Not on file  Food Insecurity: Not on file  Transportation Needs: Not on file  Physical Activity: Not on file  Stress: Not on file  Social Connections: Not on file  Intimate Partner Violence: Not on file     PHYSICAL EXAM: Vitals:   10/11/22 0913  BP: 130/81  Pulse: 61  SpO2: 97%   General: No acute distress Head:  Normocephalic/atraumatic Skin/Extremities: No rash, no edema Neurological Exam: alert and  oriented to person, place, and time. No aphasia or dysarthria. Fund of knowledge is appropriate.  Recent and remote memory are impaired. Attention and concentration are reduced. He is able to read short sentences. He cannot write any sentences, draw a clock, or intersecting pentagons. normal.  MMSE 18/30.    10/11/2022    9:00 AM 02/27/2022    9:00 AM  MMSE - Mini Mental State Exam  Orientation to time 4 4  Orientation to Place 4 4  Registration 3 3  Attention/ Calculation 2 0  Recall 0 3  Language- name 2 objects 1 2  Language- repeat 1 1  Language- follow 3 step command 2 2  Language- read & follow direction 1 1  Write a sentence 0 0  Copy design 0 0  Total score 18 20   Cranial nerves: Pupils equal, round. Extraocular movements intact with no nystagmus. Visual fields full, possibly some extinction/neglect on the left at times.  No facial asymmetry.  Motor: Bulk and tone normal, muscle strength 5/5 throughout with no pronator drift.   Finger to nose testing intact.  Gait narrow-based and steady, no ataxia. No tremors.    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 He has significant visuospatial difficulties. He is now having more difficulties with executive functioning. He had side effects on Donepezil, Rivastigmine, and Memantine. MMSE today 18/30. He is needing increasing assistance and will be moving to ALF next month. Continue Lexapro 30mg  daily. He was encouraged to continue regular exercise, brain stimulation exercises/socialization, MIND diet for overall brain health. He does not drive. Follow-up in 6 months, call for any changes.   Thank you for allowing me to participate in his care.  Please do not hesitate to call for any questions or concerns.    , M.D.   CC: Dr. Patrcia Dolly

## 2022-10-11 NOTE — Patient Instructions (Signed)
Good to see you. Continue all your medications. Follow-up in 6 months, call for any changes   FALL PRECAUTIONS: Be cautious when walking. Scan the area for obstacles that may increase the risk of trips and falls. When getting up in the mornings, sit up at the edge of the bed for a few minutes before getting out of bed. Consider elevating the bed at the head end to avoid drop of blood pressure when getting up. Walk always in a well-lit room (use night lights in the walls). Avoid area rugs or power cords from appliances in the middle of the walkways. Use a walker or a cane if necessary and consider physical therapy for balance exercise. Get your eyesight checked regularly.   RECOMMENDATIONS FOR ALL PATIENTS WITH MEMORY PROBLEMS: 1. Continue to exercise (Recommend 30 minutes of walking everyday, or 3 hours every week) 2. Increase social interactions - continue going to [place] and enjoy social gatherings with friends and family 3. Eat healthy, avoid fried foods and eat more fruits and vegetables 4. Maintain adequate blood pressure, blood sugar, and blood cholesterol level. Reducing the risk of stroke and cardiovascular disease also helps promoting better memory. 5. Avoid stressful situations. Live a simple life and avoid aggravations. Organize your time and prepare for the next day in anticipation. 6. Sleep well, avoid any interruptions of sleep and avoid any distractions in the bedroom that may interfere with adequate sleep quality 7. Avoid sugar, avoid sweets as there is a strong link between excessive sugar intake, diabetes, and cognitive impairment The Mediterranean diet has been shown to help patients reduce the risk of progressive memory disorders and reduces cardiovascular risk. This includes eating fish, eat fruits and green leafy vegetables, nuts like almonds and hazelnuts, walnuts, and also use olive oil. Avoid fast foods and fried foods as much as possible. Avoid sweets and sugar as sugar use  has been linked to worsening of memory function.  There is always a concern of gradual progression of memory problems. If this is the case, then we may need to adjust level of care according to patient needs. Support, both to the patient and caregiver, should then be put into place.

## 2022-10-24 NOTE — Telephone Encounter (Signed)
Called spouse d/t no answer from pt.  Pt has early onset dementia.  Reviewed Cardiac MRI instructions with spouse.  Will place on my chart for review.  Pt also has an OV with Dunn, PA on 10/30/22 spouse will request printout of instructions.  Note placed on office note to print out instructions.  Spouse had no questions or concerns.

## 2022-10-29 ENCOUNTER — Encounter: Payer: Self-pay | Admitting: Physician Assistant

## 2022-10-29 NOTE — Progress Notes (Signed)
Cardiology Office Note:    Date:  10/30/2022   ID:  Khalid, Lacko 11-Feb-1956, MRN [id number]  PCP:  Myrlene Broker, MD   [facility] Providers Cardiologist:  Christell Constant, MD     Referring MD: Myrlene Broker, *   Chief Complaint  Patient presents with   follow up atrial fibrillation    Seen for Dr. 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  He was last seen in our office for an initial cardiac evaluation by Dr. Izora Ribas on 08/08/22 at the request of his PCP for concerns of atrial fibrillation, at that time he was doing well from a cardiac perspective. 12 lead confirmed AF, rate controlled. TSH was normal. CHADSVASC 1 (age) therefore anticoagulation was not pursued. Echo was ordered which demonstrated severe concentric LVH, RV mildly enlarged, LA moderately dilated, trivial MR, aneurysm of the ascending aorta (45 mm), dilatation of the aortic root (37 mm).  Cardiac MRI was scheduled for 02/12/2023. Aspirin 81 mg once daily was started.  He presents today accompanied by his caregiver, he states that he is doing well.  We contacted his wife via speaker phone to allow her to be part of the conversation as well, she was at home with a repair man and sent his caregiver and her place.  He has been going to [employer] for exercise several times a week, he also goes on daily walks with his caregiver.  He admits to me that he does have dementia but he is still a lot of year and still very active.  We discussed his atrial fib and options for moving forward with treatment, including DCCV.  He is experiencing no untoward symptoms related to his atrial fib and was shocked to learn that he was in atrial fib.  Since he is asymptomatic, he really does not want to undergo DCCV at this time. He  denies chest pain, palpitations, dyspnea, pnd, orthopnea, n, v, dizziness, syncope, edema, weight gain, or early satiety.  His blood pressure was normal high initially, however on recheck it was well-managed.  His wife is in the process of moving him to an assisted living facility at [facility] and they are trying to get him slowly acclimated to the new environment.     Past Medical History:  Diagnosis Date   Aneurysm of the ascending aorta, without rupture (HCC)    Aortic root dilation (HCC)    Chicken pox    as a child   Fuch's endothelial dystrophy    GERD (gastroesophageal reflux disease)    off meds - after loosing weight   H/O infectious mononucleosis    in college   Hepatitis     Past Surgical History:  Procedure Laterality Date   COLONOSCOPY     CORNEAL TRANSPLANT  2014   Bil eyes   NASAL SEPTUM SURGERY     at 27 yrs of age    Current Medications: Current Meds  Medication Sig   aspirin EC 81 MG tablet Take 1 tablet (81 mg total) by mouth daily. Swallow whole.   escitalopram (LEXAPRO) 20 MG tablet Take 1 and 1/2 tablet daily   ipratropium (ATROVENT) 0.06 % nasal spray INSTILL ONE SPRAY IN EACH NOSTRIL FOUR TIMES DAILY   RABEprazole (ACIPHEX) 20 MG tablet Take 1 tablet (20 mg  total) by mouth daily. Annual appt due in NOV must see provider for future refills     Allergies:   Patient has no known allergies.   Social History   Socioeconomic History   Marital status: Married    Spouse name: Not on file   Number of children: 3   Years of education: 65   Highest education level: Not on file  Occupational History   Occupation: lawyer    Employer: [employer]  Tobacco Use   Smoking status: Never   Smokeless tobacco: Never  Vaping Use   Vaping Use: Never used  Substance and Sexual Activity   Alcohol use: Yes    Alcohol/week: 8.0 - 10.0 standard drinks of alcohol    Types: 8 - 10 Standard drinks or equivalent per week    Comment: occ   Drug use: No    Sexual activity: Yes    Partners: Female  Other Topics Concern   Not on file  Social History Narrative   HSG, [employer] - Radio broadcast assistant; [place]. Married '77- 13/divorced. Married '95.  3 adult children - '82, '85, '86 (step daughter). Work - Research scientist (physical sciences).    Left handed    One story   Hot tea and coffee             Social Determinants of Health   Financial Resource Strain: Not on file  Food Insecurity: Not on file  Transportation Needs: Not on file  Physical Activity: Not on file  Stress: Not on file  Social Connections: Not on file     Family History: The patient's family history includes Cancer in his mother; Colon polyps in his mother; Heart disease in his father; Hypertension in his father and mother.  ROS:   Please see the history of present illness.     All other systems reviewed and are negative.  EKGs/Labs/Other Studies Reviewed:    The following studies were reviewed today:  08/27/22 echo complete - LV has normal function.  Severe concentric LVH, RV is mildly enlarged, LA is moderately dilated, trivial MR.  Aneurysm of the ascending aorta (45 mm).  Dilatation of the aortic root (37 mm).   EKG:  EKG is  ordered today.  The ekg ordered today demonstrates atrial fibrillation with slow ventricular response, HR 57bpm, consistent with previous tracing.   Recent Labs: 07/10/2022: ALT 19; BUN 18; Creatinine, Ser 1.02; Hemoglobin 15.2; Platelets 214.0; Potassium 4.5; Sodium 139; TSH 0.77  Recent Lipid Panel    Component Value Date/Time   CHOL 182 07/10/2022 1113   TRIG 116.0 07/10/2022 1113   HDL 64.50 07/10/2022 1113   CHOLHDL 3 07/10/2022 1113   VLDL 23.2 07/10/2022 1113   LDLCALC 95 07/10/2022 1113     Risk Assessment/Calculations:    CHA2DS2-VASc Score = 1   This indicates a 0.6% annual risk of stroke. The patient's score is based upon: CHF History: 0 HTN History: 0 Diabetes History: 0 Stroke History: 0 Vascular  Disease History: 0 Age Score: 1 Gender Score: 0           STOP-Bang Score:  4       Physical Exam:    VS:  BP 126/82   Pulse (!) 57   Ht 6\' 4"  (1.93 m)   Wt 236 lb 12.8 oz (107.4 kg)   SpO2 98%   BMI 28.82 kg/m     Wt Readings from Last 3 Encounters:  10/30/22 236 lb 12.8  oz (107.4 kg)  10/11/22 237 lb 6.4 oz (107.7 kg)  08/08/22 227 lb (103 kg)     GEN:  Well nourished, well developed in no acute distress HEENT: Normal NECK: No JVD; No carotid bruits LYMPHATICS: No lymphadenopathy CARDIAC: irregular rhythm noted, no murmurs, rubs, gallops RESPIRATORY:  Clear to auscultation without rales, wheezing or rhonchi  ABDOMEN: Soft, non-tender, non-distended MUSCULOSKELETAL:  No edema; No deformity  SKIN: Warm and dry NEUROLOGIC:  Alert and oriented x 3 PSYCHIATRIC:  Normal affect   ASSESSMENT:    1. Persistent atrial fibrillation ([place])   2. LVH (left ventricular hypertrophy)   3. Aneurysm of the ascending aorta, without rupture ([place])   4. Aortic root dilatation (HCC)   5. Obstructive sleep apnea   6. 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 Excessive daytime sleepiness    PLAN:    In order of problems listed above:  Persistent atrial fibrillation - remains in afib, rate controlled. Is not currently anticoagulated, CHA2DS2-VASc Score = 1 [CHF History: 0, HTN History: 0, Diabetes History: 0, Stroke History: 0, Vascular Disease History: 0, Age Score: 1, Gender Score: 0].  Therefore, the patient's annual risk of stroke is 0.6 %.   They are planning on getting an Apple watch for surveillance of his AF/heart rate though he is know to be persistently in afib at this time. Prior to OV, discussed strategy with Dr. Angelena Form (DOD) in Dr. Oralia Rud absence who felt it would be reasonable to pursue anticoagulation with cardioversion strategy after at least 3 weeks. At this time he does not wish to pursue. We did discuss obtaining a calcium score  as this may impact his CHADSVASC score which would then prompt initiation of anticoagulation. Per shared decision making, they are agreeable. If atherosclerosis is present bumping his score up to 2, would initiate Eliquis.  Obtain f/u CBC, CMET, baseline Mag level today. The CMET was obtained in the event that plaque is present, prompting statin initiation. LVH - Blood pressure today 126/82, well controlled. Dr. Gasper Sells has already recommended cMRI, MRa chest scheduled in May for follow up.  Aneurysm of the AA/Aortic root dilatation - Plan cMRI/Mra as above. Will share aneurysm information via MyChart so they have this for their records. Continue BP surveillance. HR 50s so will hold off beta blocker.  OSA -previously has worn CPAP in the past however states he has lost significant weight since then.  Most recently had worn a dental appliance per his dentist though has not been using this.  Will arrange at home sleep study.  Alzheimer's dementia -recently saw his neurologist in December 2023, at that time his disease had progressed and his wife was looking at him moving into an assisted living facility.   Disposition - Calcium score, if +, will start Eliquis 5 mg twice daily, check CBC, BMET and mag today.           Medication Adjustments/Labs and Tests Ordered: Current medicines are reviewed at length with the patient today.  Concerns regarding medicines are outlined above.  Orders Placed This Encounter  Procedures   CT CARDIAC SCORING (SELF PAY ONLY)   Comprehensive Metabolic Panel (CMET)   Magnesium   CBC   EKG 12-Lead   Itamar Sleep Study   No orders of the defined types were placed in this encounter.   Patient Instructions  Medication Instructions:  Your physician recommends that you continue on your current medications as directed. Please refer to the Current Medication  list given to you today. *If you need a refill on your cardiac medications before your next appointment,  please call your pharmacy*   Lab Work: TODAY-CMET, MAG, & CBC If you have labs (blood work) drawn today and your tests are completely normal, you will receive your results only by: [place] (if you have MyChart) OR A paper copy in the mail If you have any lab test that is abnormal or we need to change your treatment, we will call you to review the results.   Testing/Procedures: CARDIAC CT w/ CALCIUM SCORE  ITAMAR  Follow-Up: At [facility], you and your health needs are our priority.  As part of our continuing mission to provide you with exceptional heart care, we have created designated Provider Care Teams.  These Care Teams include your primary Cardiologist (physician) and Advanced Practice Providers (APPs -  Physician Assistants and Nurse Practitioners) who all work together to provide you with the care you need, when you need it.  We recommend signing up for the patient portal called "MyChart".  Sign up information is provided on this After Visit Summary.  MyChart is used to connect with patients for Virtual Visits (Telemedicine).  Patients are able to view lab/test results, encounter notes, upcoming appointments, etc.  Non-urgent messages can be sent to your provider as well.   To learn more about what you can do with MyChart, go to [email or web address].    Your next appointment:   6 month(s)  Provider:   Werner Lean, MD     Other Instructions   You are scheduled for Cardiac MRI on ___5/1/24__. Please arrive for your appointment at ______________ ( arrive 30-45 minutes prior to test start time). ?  [facility] [address] [place], [place] [address] [phone] Please take advantage of the free valet parking available at the MAIN entrance (A entrance).  Proceed to the [facility] Radiology Department (First Floor) for check-in.   Magnetic resonance imaging (MRI) is a painless test that produces images of the inside of the  body without using Xrays.  During an MRI, strong magnets and radio waves work together in a Research officer, political party to form detailed images.   MRI images may provide more details about a medical condition than X-rays, CT scans, and ultrasounds can provide.  You may be given earphones to listen for instructions.  You may eat a light breakfast and take medications as ordered with the exception of HCTZ (fluid pill, other). Please avoid stimulants for 12 hr prior to test. (Ie. Caffeine, nicotine, chocolate, or antihistamine medications)  If a contrast material will be used, an IV will be inserted into one of your veins. Contrast material will be injected into your IV. It will leave your body through your urine within a day. You may be told to drink plenty of fluids to help flush the contrast material out of your system.  You will be asked to remove all metal, including: Watch, jewelry, and other metal objects including hearing aids, hair pieces and dentures. Also wearable glucose monitoring systems (ie. Freestyle Libre and Omnipods) (Braces and fillings normally are not a problem.)   TEST WILL TAKE APPROXIMATELY 1 HOUR  PLEASE NOTIFY SCHEDULING AT LEAST 24 HOURS IN ADVANCE IF YOU ARE UNABLE TO KEEP YOUR APPOINTMENT. [phone]  Please call Marchia Bond, cardiac imaging nurse navigator with any questions/concerns. Marchia Bond RN Navigator Cardiac Imaging Gordy Clement RN Navigator Cardiac Imaging [facility] Heart and Vascular Services [phone] Office  Signed, Trudi Ida, NP  10/30/2022 3:38 PM    [place]

## 2022-10-30 ENCOUNTER — Ambulatory Visit: Payer: Medicare Other | Attending: Physician Assistant | Admitting: Cardiology

## 2022-10-30 ENCOUNTER — Encounter: Payer: Self-pay | Admitting: Cardiology

## 2022-10-30 VITALS — BP 126/82 | HR 57 | Ht 76.0 in | Wt 236.8 lb

## 2022-10-30 DIAGNOSIS — I77810 Thoracic aortic ectasia: Secondary | ICD-10-CM | POA: Diagnosis not present

## 2022-10-30 DIAGNOSIS — I480 Paroxysmal atrial fibrillation: Secondary | ICD-10-CM

## 2022-10-30 DIAGNOSIS — F0280 Dementia in other diseases classified elsewhere without behavioral disturbance: Secondary | ICD-10-CM | POA: Diagnosis present

## 2022-10-30 DIAGNOSIS — I7121 Aneurysm of the ascending aorta, without rupture: Secondary | ICD-10-CM | POA: Diagnosis not present

## 2022-10-30 DIAGNOSIS — G4733 Obstructive sleep apnea (adult) (pediatric): Secondary | ICD-10-CM

## 2022-10-30 DIAGNOSIS — I4819 Other persistent atrial fibrillation: Secondary | ICD-10-CM | POA: Diagnosis not present

## 2022-10-30 DIAGNOSIS — G300 Alzheimer's disease with early onset: Secondary | ICD-10-CM | POA: Diagnosis present

## 2022-10-30 DIAGNOSIS — I517 Cardiomegaly: Secondary | ICD-10-CM | POA: Diagnosis not present

## 2022-10-30 LAB — CBC

## 2022-10-30 NOTE — Patient Instructions (Addendum)
Medication Instructions:  Your physician recommends that you continue on your current medications as directed. Please refer to the Current Medication list given to you today. *If you need a refill on your cardiac medications before your next appointment, please call your pharmacy*   Lab Work: TODAY-CMET, MAG, & CBC If you have labs (blood work) drawn today and your tests are completely normal, you will receive your results only by: [place] (if you have MyChart) OR A paper copy in the mail If you have any lab test that is abnormal or we need to change your treatment, we will call you to review the results.   Testing/Procedures: CARDIAC CT w/ CALCIUM SCORE  ITAMAR  Follow-Up: At [facility], you and your health needs are our priority.  As part of our continuing mission to provide you with exceptional heart care, we have created designated Provider Care Teams.  These Care Teams include your primary Cardiologist (physician) and Advanced Practice Providers (APPs -  Physician Assistants and Nurse Practitioners) who all work together to provide you with the care you need, when you need it.  We recommend signing up for the patient portal called "MyChart".  Sign up information is provided on this After Visit Summary.  MyChart is used to connect with patients for Virtual Visits (Telemedicine).  Patients are able to view lab/test results, encounter notes, upcoming appointments, etc.  Non-urgent messages can be sent to your provider as well.   To learn more about what you can do with MyChart, go to [email or web address].    Your next appointment:   6 month(s)  Provider:   Werner Lean, MD     Other Instructions   You are scheduled for Cardiac MRI on ___5/1/24__. Please arrive for your appointment at ______________ ( arrive 30-45 minutes prior to test start time). ?  [facility] [address] [place], [place] [address] [phone] Please take  advantage of the free valet parking available at the MAIN entrance (A entrance).  Proceed to the [facility] Radiology Department (First Floor) for check-in.   Magnetic resonance imaging (MRI) is a painless test that produces images of the inside of the body without using Xrays.  During an MRI, strong magnets and radio waves work together in a Research officer, political party to form detailed images.   MRI images may provide more details about a medical condition than X-rays, CT scans, and ultrasounds can provide.  You may be given earphones to listen for instructions.  You may eat a light breakfast and take medications as ordered with the exception of HCTZ (fluid pill, other). Please avoid stimulants for 12 hr prior to test. (Ie. Caffeine, nicotine, chocolate, or antihistamine medications)  If a contrast material will be used, an IV will be inserted into one of your veins. Contrast material will be injected into your IV. It will leave your body through your urine within a day. You may be told to drink plenty of fluids to help flush the contrast material out of your system.  You will be asked to remove all metal, including: Watch, jewelry, and other metal objects including hearing aids, hair pieces and dentures. Also wearable glucose monitoring systems (ie. Freestyle Libre and Omnipods) (Braces and fillings normally are not a problem.)   TEST WILL TAKE APPROXIMATELY 1 HOUR  PLEASE NOTIFY SCHEDULING AT LEAST 24 HOURS IN ADVANCE IF YOU ARE UNABLE TO KEEP YOUR APPOINTMENT. [phone]  Please call Marchia Bond, cardiac imaging nurse navigator with any questions/concerns. Clarise Cruz  Juleen China RN Navigator Cardiac Imaging Gordy Clement RN Navigator Cardiac Imaging [facility] Heart and Vascular Services [phone] Office

## 2022-10-31 ENCOUNTER — Ambulatory Visit (HOSPITAL_BASED_OUTPATIENT_CLINIC_OR_DEPARTMENT_OTHER): Payer: Medicare Other

## 2022-10-31 LAB — CBC
Hematocrit: 44.3 % (ref 37.5–51.0)
Hemoglobin: 15.1 g/dL (ref 13.0–17.7)
MCH: 30.2 pg (ref 26.6–33.0)
MCHC: 34.1 g/dL (ref 31.5–35.7)
MCV: 89 fL (ref 79–97)
Platelets: 240 x10E3/uL (ref 150–450)
RBC: 5 x10E6/uL (ref 4.14–5.80)
RDW: 12.8 % (ref 11.6–15.4)
WBC: 8.9 x10E3/uL (ref 3.4–10.8)

## 2022-10-31 LAB — COMPREHENSIVE METABOLIC PANEL WITH GFR
ALT: 22 IU/L (ref 0–44)
AST: 23 IU/L (ref 0–40)
Albumin/Globulin Ratio: 1.7 (ref 1.2–2.2)
Albumin: 4.3 g/dL (ref 3.9–4.9)
Alkaline Phosphatase: 94 IU/L (ref 44–121)
BUN/Creatinine Ratio: 18 (ref 10–24)
BUN: 18 mg/dL (ref 8–27)
Bilirubin Total: 0.5 mg/dL (ref 0.0–1.2)
CO2: 25 mmol/L (ref 20–29)
Calcium: 9.5 mg/dL (ref 8.6–10.2)
Chloride: 102 mmol/L (ref 96–106)
Creatinine, Ser: 1.01 mg/dL (ref 0.76–1.27)
Globulin, Total: 2.5 g/dL (ref 1.5–4.5)
Glucose: 90 mg/dL (ref 70–99)
Potassium: 4.6 mmol/L (ref 3.5–5.2)
Sodium: 140 mmol/L (ref 134–144)
Total Protein: 6.8 g/dL (ref 6.0–8.5)
eGFR: 81 mL/min/1.73

## 2022-10-31 LAB — MAGNESIUM: Magnesium: 2.4 mg/dL — ABNORMAL HIGH (ref 1.6–2.3)

## 2022-11-08 NOTE — Telephone Encounter (Addendum)
I called to s/w the pt today to confirm if the pt was given an Itamar device to take home while he was here for his appt. I s/w the pt's wife who states the pt has dementia and she has not seen a device that came home with him from the appt. She states the [place] assistant brought the pt to the appt in our office. Pt's wife will check with the assistant to see if there was sleep study that was sent home with the pt. If not then she will call me back next week and we will arrange for the pt to come by the office for Itamar set up. I thanked pt's wife for her help and we will s/w early next week as to plan of care. I have given pt's wife my direct line [phone].    ----- Message from [place] sent at 10/30/2022  1:39 PM EST ----- Regarding: Itamar sleep study Hello,   A Itamar has been ordered for this patient.   Thanks,  [place], [place]

## 2022-11-12 ENCOUNTER — Telehealth: Payer: Self-pay | Admitting: *Deleted

## 2022-11-12 ENCOUNTER — Other Ambulatory Visit: Payer: Self-pay | Admitting: Internal Medicine

## 2022-11-12 NOTE — Telephone Encounter (Signed)
Left message for pt's wife, pt has been approved for Itamar study. I did send a message through [place] as well with the PIN# [id number]. Left message if the pt would be able to do the study one night this week or by the weekend. If not please call the office.

## 2022-11-12 NOTE — Telephone Encounter (Signed)
Prior Authorization for ITAMAR sent to [employer] via web portal. Tracking Number  [id number] PA REQ.  

## 2022-11-12 NOTE — Telephone Encounter (Signed)
See my chart message and phone note that message was left pt has been approved and left PIN# on my chart and vm as well.

## 2022-11-18 ENCOUNTER — Telehealth: Payer: Self-pay | Admitting: Internal Medicine

## 2022-11-18 NOTE — Telephone Encounter (Signed)
[facility] called stated they need an updated FF2 form for pt Brandon Nielsen. He is currently moving in today and need that form to be able to move in. [facility] also stated they need an updated medication list and the also faxed in form to be filled out.

## 2022-11-19 NOTE — Telephone Encounter (Signed)
[facility] called back stated patient has moved into the facility and they need a updated FL2 ASAP, stated that they faxed one over and they just need the date changed on it. If any questions can be reached at [phone].

## 2022-11-19 NOTE — Telephone Encounter (Signed)
Re faxed back over for patient

## 2022-11-27 ENCOUNTER — Ambulatory Visit (HOSPITAL_BASED_OUTPATIENT_CLINIC_OR_DEPARTMENT_OTHER)
Admission: RE | Admit: 2022-11-27 | Discharge: 2022-11-27 | Disposition: A | Discharge status: Home / Self Care | Payer: Self-pay | Source: Ambulatory Visit | Attending: Cardiology | Admitting: Cardiology

## 2022-11-27 ENCOUNTER — Encounter (HOSPITAL_BASED_OUTPATIENT_CLINIC_OR_DEPARTMENT_OTHER): Payer: Self-pay

## 2022-11-27 DIAGNOSIS — I517 Cardiomegaly: Secondary | ICD-10-CM | POA: Insufficient documentation

## 2022-11-27 DIAGNOSIS — G300 Alzheimer's disease with early onset: Secondary | ICD-10-CM | POA: Insufficient documentation

## 2022-11-27 DIAGNOSIS — F0280 Dementia in other diseases classified elsewhere without behavioral disturbance: Secondary | ICD-10-CM | POA: Insufficient documentation

## 2022-11-27 DIAGNOSIS — I77810 Thoracic aortic ectasia: Secondary | ICD-10-CM | POA: Insufficient documentation

## 2022-11-27 DIAGNOSIS — I7121 Aneurysm of the ascending aorta, without rupture: Secondary | ICD-10-CM | POA: Insufficient documentation

## 2022-11-27 DIAGNOSIS — G4733 Obstructive sleep apnea (adult) (pediatric): Secondary | ICD-10-CM | POA: Insufficient documentation

## 2022-12-09 ENCOUNTER — Telehealth: Payer: Self-pay | Admitting: Internal Medicine

## 2022-12-09 NOTE — Telephone Encounter (Signed)
Brandon Nielsen with [facility] calls today in regards to PT's onset of diarrhea. They are wanting to know if Dr.Crawford would suggest anything be prescribed to him just incase of symptoms continuing? They stated that they would handle all the orders on their end.  CB: [phone]

## 2022-12-10 NOTE — Telephone Encounter (Signed)
Okay to use imodium otc daily prn diarrhea.

## 2022-12-11 NOTE — Telephone Encounter (Signed)
Left detailed message for patient for Ms susan

## 2022-12-22 ENCOUNTER — Emergency Department (HOSPITAL_COMMUNITY): Payer: Medicare Other

## 2022-12-22 ENCOUNTER — Other Ambulatory Visit: Payer: Self-pay

## 2022-12-22 ENCOUNTER — Emergency Department (HOSPITAL_COMMUNITY)
Admission: EM | Admit: 2022-12-22 | Discharge: 2022-12-22 | Disposition: A | Discharge status: Home / Self Care | Payer: Medicare Other | Attending: Emergency Medicine | Admitting: Emergency Medicine

## 2022-12-22 ENCOUNTER — Encounter (HOSPITAL_COMMUNITY): Payer: Self-pay | Admitting: *Deleted

## 2022-12-22 DIAGNOSIS — R109 Unspecified abdominal pain: Secondary | ICD-10-CM | POA: Insufficient documentation

## 2022-12-22 DIAGNOSIS — I959 Hypotension, unspecified: Secondary | ICD-10-CM | POA: Diagnosis not present

## 2022-12-22 DIAGNOSIS — Z7982 Long term (current) use of aspirin: Secondary | ICD-10-CM | POA: Insufficient documentation

## 2022-12-22 DIAGNOSIS — R197 Diarrhea, unspecified: Secondary | ICD-10-CM | POA: Insufficient documentation

## 2022-12-22 HISTORY — DX: Unspecified dementia, unspecified severity, without behavioral disturbance, psychotic disturbance, mood disturbance, and anxiety: F03.90

## 2022-12-22 LAB — COMPREHENSIVE METABOLIC PANEL
ALT: 19 U/L (ref 0–44)
AST: 27 U/L (ref 15–41)
Albumin: 3.7 g/dL (ref 3.5–5.0)
Alkaline Phosphatase: 68 U/L (ref 38–126)
Anion gap: 7 (ref 5–15)
BUN: 17 mg/dL (ref 8–23)
CO2: 27 mmol/L (ref 22–32)
Calcium: 9.2 mg/dL (ref 8.9–10.3)
Chloride: 102 mmol/L (ref 98–111)
Creatinine, Ser: 1.12 mg/dL (ref 0.61–1.24)
GFR, Estimated: >60 mL/min (ref >60)
Glucose, Bld: 82 mg/dL (ref 70–99)
Potassium: 4.6 mmol/L (ref 3.5–5.1)
Sodium: 136 mmol/L (ref 135–145)
Total Bilirubin: 0.8 mg/dL (ref 0.3–1.2)
Total Protein: 6.7 g/dL (ref 6.5–8.1)

## 2022-12-22 LAB — URINALYSIS, ROUTINE W REFLEX MICROSCOPIC
Bilirubin Urine: NEGATIVE
Glucose, UA: NEGATIVE mg/dL
Hgb urine dipstick: NEGATIVE
Ketones, ur: NEGATIVE mg/dL
Leukocytes,Ua: NEGATIVE
Nitrite: NEGATIVE
Protein, ur: NEGATIVE mg/dL
Specific Gravity, Urine: 1.031 — ABNORMAL HIGH (ref 1.005–1.030)
pH: 6 (ref 5.0–8.0)

## 2022-12-22 LAB — CBC
HCT: 47.1 % (ref 39.0–52.0)
Hemoglobin: 15.7 g/dL (ref 13.0–17.0)
MCH: 30.5 pg (ref 26.0–34.0)
MCHC: 33.3 g/dL (ref 30.0–36.0)
MCV: 91.5 fL (ref 80.0–100.0)
Platelets: 242 10*3/uL (ref 150–400)
RBC: 5.15 MIL/uL (ref 4.22–5.81)
RDW: 12.9 % (ref 11.5–15.5)
WBC: 13 10*3/uL — ABNORMAL HIGH (ref 4.0–10.5)
nRBC: 0 % (ref 0.0–0.2)

## 2022-12-22 LAB — LIPASE, BLOOD: Lipase: 41 U/L (ref 11–51)

## 2022-12-22 MED ORDER — SODIUM CHLORIDE 0.9 % IV BOLUS
1000.0000 mL | Freq: Once | INTRAVENOUS | Status: AC
  Administered 2022-12-22: 1000 mL via INTRAVENOUS

## 2022-12-22 MED ORDER — IOHEXOL 350 MG/ML SOLN
75.0000 mL | Freq: Once | INTRAVENOUS | Status: AC | PRN
  Administered 2022-12-22: 75 mL via INTRAVENOUS

## 2022-12-22 NOTE — Discharge Instructions (Signed)
Please read and follow all provided instructions.  Your diagnoses today include:  1. Diarrhea, unspecified type   2. Transient hypotension     Tests performed today include: Blood cell counts and platelets: Elevated white blood cell count, otherwise unremarkable Kidney and liver function tests Pancreas function test (called lipase) CT of the abdomen pelvis: No signs of infection or other significant problems Vital signs. See below for your results today.   Medications prescribed:  Imodium - you can take 2 mg every 6 hours, and after every episode of loose stool.  Due to please do not take more than 16 mg for 8 tablets in a day  Take any prescribed medications only as directed.  Home care instructions:  Follow any educational materials contained in this packet.  Follow-up instructions: Please follow-up with your primary care provider in the next 3 days for further evaluation of your symptoms.    Return instructions:  SEEK IMMEDIATE MEDICAL ATTENTION IF: The pain does not go away or becomes severe  A temperature above 101F develops  Repeated vomiting occurs (multiple episodes)  The pain becomes localized to portions of the abdomen. The right side could possibly be appendicitis. In an adult, the left lower portion of the abdomen could be colitis or diverticulitis.  Blood is being passed in stools or vomit (bright red or black tarry stools)  You develop chest pain, difficulty breathing, dizziness or fainting, or become confused, poorly responsive, or inconsolable (young children) If you have any other emergent concerns regarding your health  Additional Information: Abdominal (belly) pain can be caused by many things. Your caregiver performed an examination and possibly ordered blood/urine tests and imaging (CT scan, x-rays, ultrasound). Many cases can be observed and treated at home after initial evaluation in the emergency department. Even though you are being discharged home,  abdominal pain can be unpredictable. Therefore, you need a repeated exam if your pain does not resolve, returns, or worsens. Most patients with abdominal pain don't have to be admitted to the hospital or have surgery, but serious problems like appendicitis and gallbladder attacks can start out as nonspecific pain. Many abdominal conditions cannot be diagnosed in one visit, so follow-up evaluations are very important.  Your vital signs today were: BP 134/88   Pulse 64   Temp 97.6 F (36.4 C) (Oral)   Resp 15   Ht '6\' 4"'$  (1.93 m)   Wt 107.4 kg   SpO2 98%   BMI 28.82 kg/m  If your blood pressure (bp) was elevated above 135/85 this visit, please have this repeated by your doctor within one month. --------------

## 2022-12-22 NOTE — ED Triage Notes (Signed)
PT here from [facility] at [facility] living for acute episode of diarrhea and nausea while drinking his coffee this morning.  Pt presently denies pain and has no complaints.

## 2022-12-22 NOTE — ED Provider Notes (Signed)
[facility] Provider Note   CSN: [id number] Arrival date & time: 12/22/22  1145     History  Chief Complaint  Patient presents with   Diarrhea    Brandon Nielsen is a 67 y.o. male.  Patient presents to the emergency department today from [facility] at [facility] assisted living, history of atrial fibrillation not on anticoagulation due to low risk CHA2DS2-VASc score, early onset Alzheimer's disease, thoracic aortic aneurysm undergoing monitoring --presents to the emergency department today for evaluation of diarrhea.  Patient was in his normal state of health when he awoke this morning.  He has had several large bowel movements this morning without blood.  He has had associated nausea.  Denies fever, abdominal pain.  No vomiting.  No recent antibiotic use reported.    Additional history obtained from another family member later in visit, states that patient had a transient episode of hypotension during the bowel movement today.  Unknown sure what the blood pressure actually was however this resolved shortly afterwards.  He has not had this with previous episodes of diarrhea.  He did not have associated chest pain or syncope.       Home Medications Prior to Admission medications   Medication Sig Start Date End Date Taking? Authorizing Provider  aspirin EC 81 MG tablet Take 1 tablet (81 mg total) by mouth daily. Swallow whole. 08/08/22   Werner Lean, MD  escitalopram (LEXAPRO) 20 MG tablet Take 1 and 1/2 tablet daily 10/11/22   Cameron Sprang, MD  ipratropium (ATROVENT) 0.06 % nasal spray INSTILL ONE SPRAY IN [facility] NOSTRIL FOUR TIMES DAILY 11/12/22   Hoyt Koch, MD  RABEprazole (ACIPHEX) 20 MG tablet Take 1 tablet (20 mg total) by mouth daily. Annual appt due in NOV must see provider for future refills 05/01/22   Hoyt Koch, MD      Allergies    Patient has no known allergies.    Review of Systems    Review of Systems  Physical Exam Updated Vital Signs BP 120/85 (BP Location: Right Arm)   Pulse 68   Temp 97.6 F (36.4 C) (Oral)   Resp 18   Ht '6\' 4"'$  (1.93 m)   Wt 107.4 kg   SpO2 97%   BMI 28.82 kg/m   Physical Exam Vitals and nursing note reviewed.  Constitutional:      General: He is not in acute distress.    Appearance: He is well-developed.  HENT:     Head: Normocephalic and atraumatic.  Eyes:     General:        Right eye: No discharge.        Left eye: No discharge.     Conjunctiva/sclera: Conjunctivae normal.  Cardiovascular:     Rate and Rhythm: Normal rate and regular rhythm.     Heart sounds: Normal heart sounds.  Pulmonary:     Effort: Pulmonary effort is normal.     Breath sounds: Normal breath sounds.  Abdominal:     Palpations: Abdomen is soft.     Tenderness: There is no abdominal tenderness.  Musculoskeletal:     Cervical back: Normal range of motion and neck supple.  Skin:    General: Skin is warm and dry.  Neurological:     Mental Status: He is alert.     ED Results / Procedures / Treatments   Labs (all labs ordered are listed, but only abnormal results are displayed)  Labs Reviewed  CBC - Abnormal; Notable for the following components:      Result Value   WBC 13.0 (*)    All other components within normal limits  URINALYSIS, ROUTINE W REFLEX MICROSCOPIC - Abnormal; Notable for the following components:   Specific Gravity, Urine 1.031 (*)    All other components within normal limits  LIPASE, BLOOD  COMPREHENSIVE METABOLIC PANEL    ED ECG REPORT   Date: 12/22/2022  Rate: 61  Rhythm: atrial fibrillation  QRS Axis: right  Intervals: normal  ST/T Wave abnormalities: normal  Conduction Disutrbances:none  Narrative Interpretation:   Old EKG Reviewed: unchanged  I have personally reviewed the EKG tracing and agree with the computerized printout as noted.   Radiology CT ABDOMEN PELVIS W CONTRAST  Result Date:  12/22/2022 CLINICAL DATA:  Abdominal pain, acute, nonlocalized abdominal cramping, diarrhea EXAM: CT ABDOMEN AND PELVIS WITH CONTRAST TECHNIQUE: Multidetector CT imaging of the abdomen and pelvis was performed using the standard protocol following bolus administration of intravenous contrast. RADIATION DOSE REDUCTION: This exam was performed according to the departmental dose-optimization program which includes automated exposure control, adjustment of the mA and/or kV according to patient size and/or use of iterative reconstruction technique. CONTRAST:  55m OMNIPAQUE IOHEXOL 350 MG/ML SOLN COMPARISON:  None Available. FINDINGS: Lower chest: No acute abnormality. Hepatobiliary: No focal liver abnormality is seen. No gallstones, gallbladder wall thickening, or biliary dilatation. Pancreas: Unremarkable. No pancreatic ductal dilatation or surrounding inflammatory changes. Spleen: Normal in size without focal abnormality. Adrenals/Urinary Tract: Unremarkable adrenal glands. Kidneys enhance symmetrically without solid lesion, stone, or hydronephrosis. 1.7 cm simple cyst within the left kidney which does not require follow-up imaging. Ureters are nondilated. Urinary bladder appears unremarkable for the degree of distention. Stomach/Bowel: Stomach is within normal limits. Appendix appears normal. No evidence of bowel wall thickening, distention, or inflammatory changes. Vascular/Lymphatic: Scattered aortoiliac atherosclerotic calcifications without aneurysm. No abdominopelvic lymphadenopathy. Reproductive: Prostate is unremarkable. Other: Pelvic lipomatosis. No free fluid. No abdominopelvic fluid collection. No pneumoperitoneum. No abdominal wall hernia. Musculoskeletal: No acute or significant osseous findings. IMPRESSION: 1. No acute abdominopelvic findings. 2. Aortic atherosclerosis (ICD10-I70.0). Electronically Signed   By: NDavina PokeD.O.   On: 12/22/2022 14:41    Procedures Procedures    Medications  Ordered in ED Medications - No data to display  ED Course/ Medical Decision Making/ A&P    Patient seen and examined. History obtained directly from patient.   Labs/EKG: Ordered CBC, CMP, lipase, UA.  Imaging: Ordered CT abdomen pelvis  Medications/Fluids: None ordered  Most recent vital signs reviewed and are as follows: BP 134/88   Pulse 64   Temp 98 F (36.7 C) (Oral)   Resp 15   Ht '6\' 4"'$  (1.93 m)   Wt 107.4 kg   SpO2 98%   BMI 28.82 kg/m   Initial impression: Diarrhea, intermittent abdominal cramps  3:25 PM Reassessment performed. Patient appears stable.  Labs personally reviewed and interpreted including: CBC with elevated white blood cell count at 13,000 otherwise hemoglobin 15.7 and CBC otherwise unremarkable; CMP unremarkable; lipase normal; UA concentrated but without compelling signs of infection.  Imaging personally visualized and interpreted including: CT abdomen and pelvis agree no acute findings.   Reviewed pertinent lab work and imaging with patient at bedside. Questions answered.   Most current vital signs reviewed and are as follows: BP 134/88   Pulse 64   Temp 98 F (36.7 C) (Oral)   Resp 15   Ht '6\' 4"'$  (1.93  m)   Wt 107.4 kg   SpO2 98%   BMI 28.82 kg/m   Plan: Will give fluid bolus prior to discharge and d/c home, discussed use of Imodium.  Reviewed patient's medication list.  Recommended Imodium every 6 hours including an additional dose after every episode of diarrhea, not to exceed 8 tablets/day.  Prescriptions written for: None  Other home care instructions discussed: Maintain good hydration  ED return instructions discussed: The patient was urged to return to the Emergency Department immediately with worsening of current symptoms, worsening abdominal pain, persistent vomiting, blood noted in stools, fever, or any other concerns. The patient verbalized understanding.   Follow-up instructions discussed: Patient encouraged to follow-up with  their PCP in 3 days. Consider GI follow-up if diarrhea is persistent.                            Medical Decision Making Amount and/or Complexity of Data Reviewed Labs: ordered. Radiology: ordered.  Risk Prescription drug management.   For this patient's complaint of abdominal pain, the following conditions were considered on the differential diagnosis: gastritis/PUD, enteritis/duodenitis, appendicitis, cholelithiasis/cholecystitis, cholangitis, pancreatitis, ruptured viscus, colitis, diverticulitis, small/large bowel obstruction, proctitis, cystitis, pyelonephritis, ureteral colic, aortic dissection, aortic aneurysm. Atypical chest etiologies were also considered including ACS, PE, and pneumonia.  No recent antibiotic use and I have low concern for C. difficile colitis.  Abdominal CT is reassuring without signs of colitis, enteritis, diverticulitis or other intra-abdominal problems.  No severe dehydration suspected.  Would likely benefit from GI evaluation for diarrhea.  This could also be related to his underlying medical problems.  The patient's vital signs, pertinent lab work and imaging were reviewed and interpreted as discussed in the ED course. Hospitalization was considered for further testing, treatments, or serial exams/observation. However as patient is well-appearing, has a stable exam, and reassuring studies today, I do not feel that they warrant admission at this time. This plan was discussed with the patient who verbalizes agreement and comfort with this plan and seems reliable and able to return to the Emergency Department with worsening or changing symptoms.          Final Clinical Impression(s) / ED Diagnoses Final diagnoses:  Diarrhea, unspecified type  Transient hypotension    Rx / DC Orders ED Discharge Orders     None         Carlisle Cater, PA-C 12/22/22 1606    Tegeler, Gwenyth Allegra, MD 12/23/22 [phone]

## 2022-12-22 NOTE — ED Notes (Signed)
Patient transported to CT 

## 2022-12-22 NOTE — ED Notes (Signed)
Patient and caregiver verbalizes understanding of discharge instructions. Opportunity for questioning and answers were provided. Armband removed by staff, pt discharged from ED. Caregiver to transport back to [facility]. Pt taken to ED entrance via wheelchair.

## 2022-12-25 ENCOUNTER — Telehealth: Payer: Self-pay | Admitting: *Deleted

## 2022-12-25 ENCOUNTER — Encounter: Payer: Self-pay | Admitting: *Deleted

## 2022-12-25 NOTE — Transitions of Care (Post Inpatient/ED Visit) (Signed)
   12/25/2022  Name: Randell Teare MRN: [id number] DOB: [birth year]  Today's TOC FU Call Status: Today's TOC FU Call Status:: Unsuccessul Call (1st Attempt) Unsuccessful Call (1st Attempt) Date: 12/25/22  Attempted to reach the patient regarding the most recent ED visit; left HIPAA compliant voice message requesting call back  Follow Up Plan: Additional outreach attempts will be made to reach the patient to complete the Transitions of Care ([place] ED visit) call.   Oneta Rack, RN, BSN, CCRN Alumnus RN CM Care Coordination/ Transition of [place] Management [phone]: direct office

## 2022-12-26 ENCOUNTER — Telehealth: Payer: Self-pay | Admitting: *Deleted

## 2022-12-26 ENCOUNTER — Encounter: Payer: Self-pay | Admitting: *Deleted

## 2022-12-26 NOTE — Transitions of Care (Post Inpatient/ED Visit) (Signed)
12/26/2022  Name: Brandon Nielsen MRN: [id number] DOB: [birth year]  Today's TOC FU Call Status: Today's TOC FU Call Status:: Successful TOC FU Call Competed TOC FU Call Complete Date: 12/26/22  ED EMMI Red Alert notification 12/25/22 from 12/24/22 call: "No scheduled follow up"  Transition Care Management Follow-up Telephone Call Date of Discharge: 12/22/22 [facility]) Type of Discharge: Emergency Department Reason for ED Visit: Other: (diarrhea; transient hypotension) How have you been since you were released from the hospital?: Better (per spouse Izora Gala: "He is back to his normal and doing great-- After I got your voice mail yesterday, I called and made him a doctor appointment and it it scheduled for next Tuesday; I am driving and can't talk-- I will call you back if I need anything") Any questions or concerns?: No  Items Reviewed: Did you receive and understand the discharge instructions provided?: Yes Medications obtained and verified?: No Medications Not Reviewed Reasons:: Other: (spouse/ caregiver declined-- she is driving during [facility] call; she denies questions/ concerns around patient's medications) Any new allergies since your discharge?: No Dietary orders reviewed?: No Do you have support at home?: Yes People in Home: spouse Name of Support/Comfort Primary Source: Entirety of call completed with spouse Izora Gala on [place], who resides with patient/ she assists with all care needs as indicated  Home Care and Equipment/Supplies: [place] Ordered?: NA Any new equipment or medical supplies ordered?: NA  Functional Questionnaire: Do you need assistance with bathing/showering or dressing?:  (unable to address specifically-- caregiver/ spouse declined) Do you need assistance with meal preparation?:  (unable to address specifically-- caregiver/ spouse declined) Do you need assistance with eating?:  (unable to address specifically--  caregiver/ spouse declined) Do you have difficulty maintaining continence:  (unable to address specifically-- caregiver/ spouse declined) Do you need assistance with getting out of bed/getting out of a chair/moving?:  (unable to address specifically-- caregiver/ spouse declined) Do you have difficulty managing or taking your medications?:  (unable to address specifically-- caregiver/ spouse declined)  Folllow up appointments reviewed: PCP Follow-up appointment confirmed?: Yes Date of PCP follow-up appointment?: 12/31/22 Follow-up Provider: PCP, dr. Sharlet Salina Specialist [facility] Follow-up appointment confirmed?: NA Do you need transportation to your follow-up appointment?: No Do you understand care options if your condition(s) worsen?: Yes-patient verbalized understanding  SDOH Interventions Today    Flowsheet Row Most Recent Value  SDOH Interventions   Food Insecurity Interventions Intervention Not Indicated  [spouse reports lives at [facility]- meals are provided]  Transportation Interventions Intervention Not Indicated  [spouse provides transportation]       TOC Interventions Today    Flowsheet Row Most Recent Value  TOC Interventions   TOC Interventions Discussed/Reviewed TOC Interventions Discussed  [Declines need for ongoing/ further care coordination outreach/ follow up-- provided my direct contact information should questions/ concerns/ care coordination needs arise post-TOC call]      Interventions Today    Flowsheet Row Most Recent Value  Chronic Disease   Chronic disease during today's visit Other  [diarrhea/ Parkinson's Disease]  General Interventions   General Interventions Discussed/Reviewed General Interventions Discussed, Doctor Visits  Doctor Visits Discussed/Reviewed Doctor Visits Discussed, PCP  PCP/Specialist Visits Compliance with follow-up visit  Nutrition Interventions   Nutrition Discussed/Reviewed Nutrition Discussed  Pharmacy Interventions    Pharmacy Dicussed/Reviewed Pharmacy Topics Discussed      Oneta Rack, RN, BSN, CCRN Alumnus RN CM Care Coordination/ Transition of [place] Management [phone]: direct office

## 2022-12-31 ENCOUNTER — Encounter: Payer: Self-pay | Admitting: Internal Medicine

## 2022-12-31 ENCOUNTER — Ambulatory Visit (INDEPENDENT_AMBULATORY_CARE_PROVIDER_SITE_OTHER): Payer: Medicare Other | Admitting: Internal Medicine

## 2022-12-31 VITALS — BP 120/80 | HR 58 | Temp 97.9°F | Ht 76.0 in | Wt 235.0 lb

## 2022-12-31 DIAGNOSIS — R197 Diarrhea, unspecified: Secondary | ICD-10-CM | POA: Diagnosis not present

## 2022-12-31 MED ORDER — FAMOTIDINE 20 MG PO TABS: 20.0000 mg | ORAL_TABLET | Freq: Every day | ORAL | 3 refills | Status: AC

## 2022-12-31 NOTE — Patient Instructions (Signed)
We will try to stop aciphex (rabeprazole) as this may be causing the diarrhea. We have sent in the pepcid to take instead.  Try adding fiber daily metamucil to see if this helps.  Another idea is to try getting on the toilet to try to go at regular intervals.

## 2022-12-31 NOTE — Progress Notes (Signed)
   Subjective:   Patient ID: Brandon Nielsen, male    DOB: [birth year], 67 y.o.   MRN: [id number]  HPI The patient is a 67 YO man coming in for ER follow up. Diarrhea worsening lately and ER trip was due to some low BP after diarrhea. Using briefs at times.   Review of Systems  Unable to perform ROS: Dementia  Constitutional: Negative.   HENT: Negative.    Eyes: Negative.   Respiratory:  Negative for cough, chest tightness and shortness of breath.   Cardiovascular:  Negative for chest pain, palpitations and leg swelling.  Gastrointestinal:  Positive for diarrhea. Negative for abdominal distention, abdominal pain, constipation, nausea and vomiting.  Musculoskeletal: Negative.   Skin: Negative.   Neurological: Negative.   Psychiatric/Behavioral: Negative.      Objective:  Physical Exam Constitutional:      Appearance: He is well-developed.  HENT:     Head: Normocephalic and atraumatic.  Cardiovascular:     Rate and Rhythm: Normal rate and regular rhythm.  Pulmonary:     Effort: Pulmonary effort is normal. No respiratory distress.     Breath sounds: Normal breath sounds. No wheezing or rales.  Abdominal:     General: Bowel sounds are normal. There is no distension.     Palpations: Abdomen is soft.     Tenderness: There is no abdominal tenderness. There is no rebound.  Musculoskeletal:     Cervical back: Normal range of motion.  Skin:    General: Skin is warm and dry.  Neurological:     Mental Status: He is alert and oriented to person, place, and time.     Coordination: Coordination normal.     Vitals:   12/31/22 1607  BP: 120/80  Pulse: (!) 58  Temp: 97.9 F (36.6 C)  TempSrc: Oral  SpO2: 98%  Weight: 235 lb (106.6 kg)  Height: 6\' 4"  (1.93 m)    Assessment & Plan:  Visit time 18 minutes in face to face communication with patient and coordination of care, additional 2 minutes spent in record review, coordination or care, ordering tests,  communicating/referring to other healthcare professionals, documenting in medical records all on the same day of the visit for total time 20 minutes spent on the visit.

## 2023-01-03 NOTE — Assessment & Plan Note (Signed)
No GERD symptoms in some time. Will stop aciphex as this can contribute. Start pepcid 20 mg qhs. We have also talked about daily fiber supplement to help with consistency. We also talked about scheduled toileting to help empty bowels regularly so that he is not having the urgency as much. Lexapro could cause diarrhea as well. We elect not to make too many changes at once. Reassess based on results and start with stopping PPI and adding fiber.

## 2023-01-18 ENCOUNTER — Ambulatory Visit
Admission: EM | Admit: 2023-01-18 | Discharge: 2023-01-18 | Disposition: A | Discharge status: Home / Self Care | Payer: Medicare Other | Attending: Family Medicine | Admitting: Family Medicine

## 2023-01-18 ENCOUNTER — Other Ambulatory Visit: Payer: Self-pay

## 2023-01-18 ENCOUNTER — Encounter: Payer: Self-pay | Admitting: Emergency Medicine

## 2023-01-18 DIAGNOSIS — R509 Fever, unspecified: Secondary | ICD-10-CM

## 2023-01-18 DIAGNOSIS — R059 Cough, unspecified: Secondary | ICD-10-CM | POA: Diagnosis not present

## 2023-01-18 LAB — POCT INFLUENZA A/B
Influenza A, POC: NEGATIVE
Influenza B, POC: NEGATIVE

## 2023-01-18 MED ORDER — BENZONATATE 200 MG PO CAPS: 200.0000 mg | ORAL_CAPSULE | Freq: Three times a day (TID) | ORAL | 0 refills | Status: AC | PRN

## 2023-01-18 MED ORDER — ACETAMINOPHEN 325 MG PO TABS
650.0000 mg | ORAL_TABLET | Freq: Once | ORAL | Status: AC
  Administered 2023-01-18: 650 mg via ORAL

## 2023-01-18 NOTE — ED Provider Notes (Signed)
[facility]    CSN: [id number] Arrival date & time: 01/18/23  1249      History   Chief Complaint Chief Complaint  Patient presents with   Cough    HPI Brandon Nielsen is a 67 y.o. male.   HPI Pleasant 67 year old male presents with hacking cough, nasal congestion and oral temperature of 100.3.  Patient is accompanied by a caregiver as his wife is out of town.  Caregiver reports patient is currently resident at [facility].  PMH significant for early onset Alzheimer's dementia without behavioral disturbance and persistent atrial fibrillation.  Past Medical History:  Diagnosis Date   Aneurysm of the ascending aorta, without rupture    Aortic root dilation    Chicken pox    as a child   Dementia    Fuch's endothelial dystrophy    GERD (gastroesophageal reflux disease)    off meds - after loosing weight   H/O infectious mononucleosis    in college   Hepatitis     Patient Active Problem List   Diagnosis Date Noted   Atrial fibrillation 07/11/2022   Diarrhea 01/17/2022   Early onset Alzheimer's dementia without behavioral disturbance 01/13/2020   Routine health maintenance 11/08/2012   Obstructive sleep apnea 01/09/2008   Allergic rhinitis 01/09/2008   GASTROESOPHAGEAL REFLUX DISEASE 01/09/2008    Past Surgical History:  Procedure Laterality Date   COLONOSCOPY     CORNEAL TRANSPLANT  2014   Bil eyes   NASAL SEPTUM SURGERY     at 9 yrs of age       Home Medications    Prior to Admission medications   Medication Sig Start Date End Date Taking? Authorizing Provider  benzonatate (TESSALON) 200 MG capsule Take 1 capsule (200 mg total) by mouth 3 (three) times daily as needed for up to 7 days. 01/18/23 01/25/23 Yes Trevor Ihaagan, Kairee Isa, FNP  aspirin EC 81 MG tablet Take 1 tablet (81 mg total) by mouth daily. Swallow whole. 08/08/22   Christell Constanthandrasekhar, Mahesh A, MD  escitalopram (LEXAPRO) 20 MG tablet Take 1 and 1/2 tablet daily 10/11/22   Van ClinesAquino, Karen M, MD   famotidine (PEPCID) 20 MG tablet Take 1 tablet (20 mg total) by mouth daily. 12/31/22   Myrlene Brokerrawford, Elizabeth A, MD  ipratropium (ATROVENT) 0.06 % nasal spray INSTILL ONE SPRAY IN EACH NOSTRIL FOUR TIMES DAILY 11/12/22   Myrlene Brokerrawford, Elizabeth A, MD    Family History Family History  Problem Relation Age of Onset   Cancer Mother        cervical cancer/non-hodgikins lymphoma   Hypertension Mother    Colon polyps Mother    Heart disease Father    Hypertension Father     Social History Social History   Tobacco Use   Smoking status: Never   Smokeless tobacco: Never  Vaping Use   Vaping Use: Never used  Substance Use Topics   Alcohol use: Yes    Alcohol/week: 8.0 - 10.0 standard drinks of alcohol    Types: 8 - 10 Standard drinks or equivalent per week    Comment: occ   Drug use: No     Allergies   Patient has no known allergies.   Review of Systems Review of Systems  Constitutional:  Positive for fever.  All other systems reviewed and are negative.    Physical Exam Triage Vital Signs ED Triage Vitals  Enc Vitals Group     BP 01/18/23 1326 131/84     Pulse Rate 01/18/23 1326 (!)  112     Resp 01/18/23 1326 16     Temp 01/18/23 1326 (!) 101.2 F (38.4 C)     Temp Source 01/18/23 1326 Oral     SpO2 01/18/23 1326 95 %     Weight 01/18/23 1327 235 lb (106.6 kg)     Height --      Head Circumference --      Peak Flow --      Pain Score --      Pain Loc --      Pain Edu? --      Excl. in GC? --    No data found.  Updated Vital Signs BP 131/84 (BP Location: Left Arm)   Pulse (!) 112   Temp (!) 101.2 F (38.4 C) (Oral)   Resp 16   Wt 235 lb (106.6 kg)   SpO2 95%   BMI 28.61 kg/m    Physical Exam Vitals and nursing note reviewed.  Constitutional:      Appearance: Normal appearance. He is obese. He is ill-appearing.  HENT:     Head: Normocephalic and atraumatic.     Right Ear: Tympanic membrane, ear canal and external ear normal.     Left Ear: Tympanic  membrane, ear canal and external ear normal.     Mouth/Throat:     Mouth: Mucous membranes are moist.     Pharynx: Oropharynx is clear.  Eyes:     Extraocular Movements: Extraocular movements intact.     Conjunctiva/sclera: Conjunctivae normal.     Pupils: Pupils are equal, round, and reactive to light.  Cardiovascular:     Rate and Rhythm: Normal rate and regular rhythm.     Pulses: Normal pulses.     Heart sounds: Normal heart sounds.     Comments: Irregularly irregular Pulmonary:     Effort: Pulmonary effort is normal.     Breath sounds: Normal breath sounds. No wheezing, rhonchi or rales.     Comments: Infrequent nonproductive cough noted on exam Musculoskeletal:        General: Normal range of motion.     Cervical back: Normal range of motion and neck supple.  Skin:    General: Skin is warm and dry.  Neurological:     General: No focal deficit present.     Mental Status: He is alert.      UC Treatments / Results  Labs (all labs ordered are listed, but only abnormal results are displayed) Labs Reviewed  POCT INFLUENZA A/B    EKG   Radiology No results found.  Procedures Procedures (including critical care time)  Medications Ordered in UC Medications  acetaminophen (TYLENOL) tablet 650 mg (650 mg Oral Given 01/18/23 1335)    Initial Impression / Assessment and Plan / UC Course  I have reviewed the triage vital signs and the nursing notes.  Pertinent labs & imaging results that were available during my care of the patient were reviewed by me and considered in my medical decision making (see chart for details).     MDM: 1.  Fever, unspecified-Tylenol 650 mg given once in clinic and prior to discharge, advised OTC Tylenol 650 mg given every 8 hours for fever; 2.  Cough, unspecified type Rx'd Tessalon Perles 200 mg 3 times daily, as needed for cough. Advised patient's caregiver influenza was negative today. Advised may take Tessalon Perles 3 times daily, as  needed for cough.  Advised may give OTC Tylenol 650 mg every 8 hours for fever.  Encouraged increase daily water intake to 64 ounces per day while taking this medication.  Advised if symptoms worsen and/or unresolved please follow-up with PCP or here for further evaluation. Final Clinical Impressions(s) / UC Diagnoses   Final diagnoses:  Cough, unspecified type  Fever, unspecified     Discharge Instructions      Advised patient's caregiver influenza was negative today. Advised may take Tessalon Perles 3 times daily, as needed for cough.  Advised may give OTC Tylenol 650 mg every 8 hours for fever.  Encouraged increase daily water intake to 64 ounces per day while taking this medication.  Advised if symptoms worsen and/or unresolved please follow-up with PCP or here for further evaluation.     ED Prescriptions     Medication Sig Dispense Auth. Provider   benzonatate (TESSALON) 200 MG capsule Take 1 capsule (200 mg total) by mouth 3 (three) times daily as needed for up to 7 days. 21 capsule Trevor Ihaagan, Baylea Milburn, FNP      PDMP not reviewed this encounter.   Trevor IhaRagan, Saquoia Sianez, FNP 01/18/23 1441

## 2023-01-18 NOTE — Discharge Instructions (Addendum)
Advised patient's caregiver influenza was negative today. Advised may take Tessalon Perles 3 times daily, as needed for cough.  Advised may give OTC Tylenol 650 mg every 8 hours for fever.  Encouraged increase daily water intake to 64 ounces per day while taking this medication.  Advised if symptoms worsen and/or unresolved please follow-up with PCP or here for further evaluation.

## 2023-01-18 NOTE — ED Triage Notes (Signed)
Cough (hacking) per pt x 24 hours  Nasal congestion  Temp 100.3 Being treated for pink eye  Pt here w/ a care giver - his  wife is out of town  Negative COVID test at [facility] today  Temp 101.2 in triage

## 2023-01-19 ENCOUNTER — Telehealth: Payer: Self-pay | Admitting: Family Medicine

## 2023-01-19 NOTE — Telephone Encounter (Signed)
Received call from on-call service.  They noted the patient's facility was calling to get some orders.  She connected me to the facility.  The nurse I spoke with noted the patient noted right foot swelling and pain today.  The patient advised the nurse that he tripped over his shoe.  He had some trouble bearing weight after this.  He was able to pivot to change positions.  The nurse notes he was walking normally yesterday.  She also notes when he was provided with a urinal to urinate and his urine was dark brown in color.  She noted the patient does not drink much water and typically only drinks soda.  She notes that is difficult to know if he is having any other urine symptoms as he does have dementia.  Of note the patient was seen at urgent care yesterday for cough and congestion symptoms with fever.  He had a negative flu test.  They advised use of Tylenol for fever and Tessalon Perles for cough.  The patient's nurse notes he is not having any issues with these respiratory complaints today.  The nurse is requesting an x-ray order and a urine order.  I gave verbal orders for an x-ray of his right foot and ankle.  I gave verbal orders for point-of-care urinalysis.  The nurse noted these would be faxed to his PCPs office.  I advised that the patient did need to be evaluated for these things either today or tomorrow.  Discussed if he developed any progression of symptoms or had any urinary symptoms or difficulty controlling pain with Tylenol that they should have him evaluated at urgent care today.  Otherwise they can have him seen by his PCP tomorrow.  Message sent to PCP.

## 2023-01-21 ENCOUNTER — Encounter: Payer: Self-pay | Admitting: Internal Medicine

## 2023-02-12 ENCOUNTER — Ambulatory Visit (HOSPITAL_COMMUNITY): Admission: RE | Admit: 2023-02-12 | Payer: BLUE CROSS/BLUE SHIELD | Source: Ambulatory Visit

## 2023-02-12 ENCOUNTER — Ambulatory Visit (HOSPITAL_COMMUNITY): Payer: BLUE CROSS/BLUE SHIELD

## 2023-03-17 ENCOUNTER — Ambulatory Visit (INDEPENDENT_AMBULATORY_CARE_PROVIDER_SITE_OTHER): Payer: Medicare Other

## 2023-03-17 DIAGNOSIS — Z0000 Encounter for general adult medical examination without abnormal findings: Secondary | ICD-10-CM | POA: Diagnosis not present

## 2023-03-17 NOTE — Progress Notes (Signed)
Subjective:   Brandon Nielsen is a 67 y.o. male who presents for Medicare Annual/Subsequent preventive examination.  I connected with pt's wife Brandon Nielsen today in regards to [place] as he has Alzheimer's by telephone and verified that I am speaking with the correct person using two identifiers. I discussed the limitations, risks, security and privacy concerns of performing an evaluation and management service by telephone and the availability of in person appointments. I also discussed with the patient that there may be a patient responsible charge related to this service. Wife expressed understanding and agreed to proceed. Location patient: home Location provider: [facility] Persons participating in the visit: Brandon Nielsen (wife) and Brandon Nielsen, CMA.  Time Spent with patient on telephone encounter: 10 mins  Review of Systems    No ROS. Medicare Wellness Telephone Visit. Additional risk factors are reflected in social history. Cardiac Risk Factors include: advanced age (>17men, >70 women);male gender     Objective:    There were no vitals filed for this visit. There is no height or weight on file to calculate BMI.     03/17/2023    9:10 AM 12/22/2022   11:53 AM 10/11/2022    9:24 AM 06/12/2022    9:39 AM 02/27/2022    8:38 AM 08/29/2021   10:27 AM 04/04/2021    8:57 AM  Advanced Directives  Does Patient Have a Medical Advance Directive? Yes No Yes Yes Yes Yes Yes  Type of Estate agent of [place];Living will  Healthcare Power of [place];Living will Healthcare Power of [place];Living will;Out of facility DNR (pink MOST or yellow form) Healthcare Power of [place];Living will;Out of facility DNR (pink MOST or yellow form) Healthcare Power of [place];Living will;Out of facility DNR (pink MOST or yellow form) Healthcare Power of Attorney  Does patient want to make changes to medical advance directive? No - Patient declined      No - Patient declined  Copy of  Healthcare Power of Attorney in Chart? No - copy requested        Would patient like information on creating a medical advance directive?  No - Patient declined         Current Medications (verified) Outpatient Encounter Medications as of 03/17/2023  Medication Sig   aspirin EC 81 MG tablet Take 1 tablet (81 mg total) by mouth daily. Swallow whole.   escitalopram (LEXAPRO) 20 MG tablet Take 1 and 1/2 tablet daily   famotidine (PEPCID) 20 MG tablet Take 1 tablet (20 mg total) by mouth daily.   ipratropium (ATROVENT) 0.06 % nasal spray INSTILL ONE SPRAY IN EACH NOSTRIL FOUR TIMES DAILY   No facility-administered encounter medications on file as of 03/17/2023.    Allergies (verified) Patient has no known allergies.   History: Past Medical History:  Diagnosis Date   Aneurysm of the ascending aorta, without rupture (HCC)    Aortic root dilation (HCC)    Chicken pox    as a child   Dementia (HCC)    Fuch's endothelial dystrophy    GERD (gastroesophageal reflux disease)    off meds - after loosing weight   H/O infectious mononucleosis    in college   Hepatitis    Past Surgical History:  Procedure Laterality Date   COLONOSCOPY     CORNEAL TRANSPLANT  2014   Bil eyes   NASAL SEPTUM SURGERY     at 19 yrs of age   Family History  Problem Relation Age of Onset  Cancer Mother        cervical cancer/non-hodgikins lymphoma   Hypertension Mother    Colon polyps Mother    Heart disease Father    Hypertension Father    Social History   Socioeconomic History   Marital status: Married    Spouse name: Not on file   Number of children: 3   Years of education: 28   Highest education level: Not on file  Occupational History   Occupation: lawyer    Employer: [employer]  Tobacco Use   Smoking status: Never   Smokeless tobacco: Never  Vaping Use   Vaping Use: Never used  Substance and Sexual Activity   Alcohol use: Yes    Alcohol/week: 8.0 - 10.0 standard drinks of  alcohol    Types: 8 - 10 Standard drinks or equivalent per week    Comment: occ   Drug use: No   Sexual activity: Yes    Partners: Female  Other Topics Concern   Not on file  Social History Narrative   HSG, 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 Married '77- 13/divorced. Married '95.  3 adult children - '82, '85, '86 (step daughter). Work - TEFL teacher.    Left handed    One story   Hot tea and coffee             Social Determinants of Health   Financial Resource Strain: Low Risk  (03/17/2023)   Overall Financial Resource Strain (CARDIA)    Difficulty of Paying Living Expenses: Not hard at all  Food Insecurity: No Food Insecurity (03/17/2023)   Hunger Vital Sign    Worried About Running Out of Food in the Last Year: Never true    Ran Out of Food in the Last Year: Never true  Transportation Needs: No Transportation Needs (03/17/2023)   PRAPARE - Administrator, Civil Service (Medical): No    Lack of Transportation (Non-Medical): No  Physical Activity: Sufficiently Active (03/17/2023)   Exercise Vital Sign    Days of Exercise per Week: 3 days    Minutes of Exercise per Session: 60 min  Stress: No Stress Concern Present (03/17/2023)   [employer] of Occupational Health - Occupational Stress Questionnaire    Feeling of Stress : Not at all  Social Connections: Moderately Integrated (03/17/2023)   Social Connection and Isolation Panel [NHANES]    Frequency of Communication with Friends and Family: More than three times a week    Frequency of Social Gatherings with Friends and Family: More than three times a week    Attends Religious Services: Never    Database administrator or Organizations: Yes    Attends Engineer, structural: More than 4 times per year    Marital Status: Married    Tobacco Counseling Counseling given: Not Answered   Clinical Intake:     Pain : No/denies pain     BMI - recorded: 28 Nutritional  Status: BMI 25 -29 Overweight Nutritional Risks: None Diabetes: No  How often do you need to have someone help you when you read instructions, pamphlets, or other written materials from your doctor or pharmacy?: 1 - Never What is the last grade level you completed in school?: completed law school  Interpreter Needed?: No  Information entered by :: Brandon Nielsen, CMA   Activities of Daily Living    03/17/2023    9:11 AM  In your present state of health, do  you have any difficulty performing the following activities:  Hearing? 0  Vision? 0  Difficulty concentrating or making decisions? 1  Walking or climbing stairs? 0  Doing errands, shopping? 1  Preparing Food and eating ? Y  Using the Toilet? N  In the past six months, have you accidently leaked urine? Y  Do you have problems with loss of bowel control? N  Managing your Medications? Y  Managing your Finances? Y  Housekeeping or managing your Housekeeping? Y    Patient Care Team: Myrlene Broker, MD as PCP - General (Internal Medicine) Christell Constant, MD as PCP - Cardiology (Cardiology) Elise Benne, MD (Ophthalmology) Van Clines, MD as Consulting Physician (Neurology)  Indicate any recent Medical Services you may have received from other than [facility] providers in the past year (date may be approximate).     Assessment:   This is a routine wellness examination for [facility].  Hearing/Vision screen Denied any hearing difficulty. No hearing aids. Patient does wear corrective lenses  Dietary issues and exercise activities discussed: Current Exercise Habits: Home exercise routine, Type of exercise: Other - see comments (fitness center at assisted living), Time (Minutes): 60, Frequency (Times/Week): 3, Weekly Exercise (Minutes/Week): 180, Intensity: Mild, Exercise limited by: None identified   Goals Addressed   None    Depression Screen    03/17/2023    9:10 AM 12/31/2022    4:11 PM 08/27/2021   10:16  AM 08/19/2018    9:13 AM 07/08/2017    4:24 PM  PHQ 2/9 Scores  PHQ - 2 Score 0 0 0 0 0  PHQ- 9 Score  0       Fall Risk    03/17/2023    9:10 AM 12/31/2022    4:10 PM 10/11/2022    9:24 AM 06/12/2022    9:39 AM 02/27/2022    8:38 AM  Fall Risk   Falls in the past year? 1 0 0 0 0  Number falls in past yr: 0 0 0 0 0  Injury with Fall? 1 0 0 0 0  Risk for fall due to : No Fall Risks      Follow up Falls evaluation completed Falls evaluation completed Falls evaluation completed      FALL RISK PREVENTION PERTAINING TO THE HOME:  Any stairs in or around the home? No  If so, are there any without handrails? No  Home free of loose throw rugs in walkways, pet beds, electrical cords, etc? Yes  Adequate lighting in your home to reduce risk of falls? Yes   ASSISTIVE DEVICES UTILIZED TO PREVENT FALLS:  Life alert? No  Use of a cane, walker or w/c? No  Grab bars in the bathroom? Yes  Shower chair or bench in shower? Yes  Elevated toilet seat or a handicapped toilet? Yes   TIMED UP AND GO:  Was the test performed? No .  Length of time to ambulate 10 feet: NA sec.   Has no issues with gait or balance; does not use any assistive devices.  Cognitive Function:     10/11/2022    9:00 AM 02/27/2022    9:00 AM  MMSE - Mini Mental State Exam  Orientation to time 4 4  Orientation to Place 4 4  Registration 3 3  Attention/ Calculation 2 0  Recall 0 3  Language- name 2 objects 1 2  Language- repeat 1 1  Language- follow 3 step command 2 2  Language- read & follow direction  1 1  Write a sentence 0 0  Copy design 0 0  Total score 18 20      03/02/2021   10:00 AM 12/27/2019   11:00 AM  Montreal Cognitive Assessment   Visuospatial/ Executive (0/5) 0 0  Naming (0/3) 3 3  Attention: Read list of digits (0/2) 2 2  Attention: Read list of letters (0/1) 0 1  Attention: Serial 7 subtraction starting at 100 (0/3) 1 1  Language: Repeat phrase (0/2) 2 2  Language : Fluency (0/1) 0 0   Abstraction (0/2) 2 1  Delayed Recall (0/5) 0 2  Orientation (0/6) 6 6  Total 16 18  Adjusted Score (based on education) 16 18      Immunizations Immunization History  Administered Date(s) Administered   Fluad Quad(high Dose 65+) 07/27/2021, 07/10/2022   Influenza,inj,Quad PF,6+ Mos 08/18/2014, 10/10/2015, 09/04/2016, 07/08/2017, 08/06/2018, 08/19/2019   Influenza-Unspecified 09/13/2020   PFIZER(Purple Top)SARS-COV-2 Vaccination 11/22/2019, 12/16/2019   PNEUMOCOCCAL CONJUGATE-20 08/27/2021   Tdap 11/05/2012   Zoster Recombinat (Shingrix) 09/15/2017, 11/17/2017, 12/04/2017   Zoster, Live 04/22/2016    TDAP status: Due, Education has been provided regarding the importance of this vaccine. Advised may receive this vaccine at local pharmacy or Health Dept. Aware to provide a copy of the vaccination record if obtained from local pharmacy or Health Dept. Verbalized acceptance and understanding.  Flu Vaccine status: Up to date  Pneumococcal vaccine status: Up to date  Covid-19 vaccine status: Completed vaccines  Qualifies for Shingles Vaccine? Yes   Zostavax completed No   Shingrix Completed?: Yes  Screening Tests Health Maintenance  Topic Date Due   DTaP/Tdap/Td (2 - Td or Tdap) 11/05/2022   COVID-19 Vaccine (3 - 2023-24 season) 04/02/2023 (Originally 06/14/2022)   INFLUENZA VACCINE  05/15/2023   Medicare Annual Wellness (AWV)  03/16/2024   Colonoscopy  08/25/2024   Pneumonia Vaccine 14+ Years old  Completed   Hepatitis C Screening  Completed   Zoster Vaccines- Shingrix  Completed   HPV VACCINES  Aged Out    Health Maintenance  Health Maintenance Due  Topic Date Due   DTaP/Tdap/Td (2 - Td or Tdap) 11/05/2022    Colorectal cancer screening: Type of screening: Colonoscopy. Completed 08/25/2014. Repeat every 10 years  Lung Cancer Screening: (Low Dose CT Chest recommended if Age 26-80 years, 30 pack-year currently smoking OR have quit w/in 15years.) does not qualify.    Lung Cancer Screening Referral: NA  Additional Screening:  Hepatitis C Screening: does qualify; Completed 04/22/2016  Vision Screening: Recommended annual ophthalmology exams for early detection of glaucoma and other disorders of the eye. Is the patient up to date with their annual eye exam?  Yes  Who is the provider or what is the name of the office in which the patient attends annual eye exams? [facility]  If pt is not established with a provider, would they like to be referred to a provider to establish care? No .   Dental Screening: Recommended annual dental exams for proper oral hygiene  Community Resource Referral / Chronic Care Management: CRR required this visit?  No   CCM required this visit?  No      Plan:     I have personally reviewed and noted the following in the patient's chart:   Medical and social history Use of alcohol, tobacco or illicit drugs  Current medications and supplements including opioid prescriptions. Patient is not currently taking opioid prescriptions. Functional ability and status Nutritional status Physical activity Advanced directives  List of other physicians Hospitalizations, surgeries, and ER visits in previous 12 months Vitals Screenings to include cognitive, depression, and falls Referrals and appointments  In addition, I have reviewed and discussed with patient certain preventive protocols, quality metrics, and best practice recommendations. A written personalized care plan for preventive services as well as general preventive health recommendations were provided to patient.     Marinus Maw, CMA   03/17/2023   Nurse Notes: Patient is currently living in assisted living facility so information has been obtained by speaking with wife.

## 2023-04-22 ENCOUNTER — Encounter: Payer: Self-pay | Admitting: Neurology

## 2023-04-22 ENCOUNTER — Ambulatory Visit (INDEPENDENT_AMBULATORY_CARE_PROVIDER_SITE_OTHER): Payer: Medicare Other | Admitting: Neurology

## 2023-04-22 VITALS — BP 109/77 | HR 82 | Ht 73.0 in | Wt 226.2 lb

## 2023-04-22 DIAGNOSIS — G300 Alzheimer's disease with early onset: Secondary | ICD-10-CM

## 2023-04-22 DIAGNOSIS — F32A Depression, unspecified: Secondary | ICD-10-CM

## 2023-04-22 DIAGNOSIS — F419 Anxiety disorder, unspecified: Secondary | ICD-10-CM | POA: Diagnosis not present

## 2023-04-22 DIAGNOSIS — F02818 Dementia in other diseases classified elsewhere, unspecified severity, with other behavioral disturbance: Secondary | ICD-10-CM

## 2023-04-22 MED ORDER — ESCITALOPRAM OXALATE 20 MG PO TABS: ORAL_TABLET | ORAL | 11 refills | Status: DC

## 2023-04-22 NOTE — Patient Instructions (Signed)
Always a pleasure to see you. Continue Lexapro 30mg  daily. Continue regular exercise, drumming classes, stay active as much as possible. Follow-up in 6 months, call for any changes.

## 2023-04-22 NOTE — Progress Notes (Signed)
NEUROLOGY FOLLOW UP OFFICE NOTE  Brandon Nielsen [id number] [birth year]  HISTORY OF PRESENT ILLNESS: I had the pleasure of seeing Brandon Nielsen in follow-up in the neurology clinic on 04/22/2023.  The patient was last seen on 6 months ago for early onset Alzheimer's dementia. He is again accompanied by his son [name] who helps supplement the history today.  Records and images were personally reviewed where available.  Since his last visit, he reports doing well. He stays positive and active at [facility]. He has caregivers from 8am-8pm who manage medications, stay on standby for bathing and dressing. He is able to dress and bathe independently, he shaves himself and has no difficulties brushing his teeth. [name] reports he is good with routine, but other times from moment to moment it would be like he had never seen a shirt before. He has more difficulties with executive functioning worsening on a monthly basis, such as his ability to open a door or pick up a fork to eat. He remains in good spirits and holds good conversations, social with his peers. He goes to the gym 2-4 times a week and joins drumming classes 1-2 times a week which he enjoys. Appetite and sleep are good. Mood is good, he gets mildly frustrated more easily. No hallucinations or paranoia.   He denies any headaches, dizziness, focal numbness/tingling/weakness. He was in the ER for diarrhea, this has resolved. He reports it has helped to switch to decaf coffee. He is not having incontinence issues nearly as frequent. He had a fall 2 weeks ago getting to the shower, his aide said he looked like he was dizzy then kind of collapsed, no loss of consciousness. He continues on Lexapro 30mg  daily without side effects. He had tried Donepezil, Rivastigmine, and Memantine which caused side effects.     History on Initial Assessment 03/02/2021: This is a pleasant 67 year old left-handed man with a history of GERD, presenting to establish  care for early onset Alzheimer's disease. His wife Brandon Nielsen is present to provide additional information. She started noticing confusion around 7-8 years ago, worse in the past year. She reports he has changed so much in his ability to function. She took over finances 2-3 years ago because he was having a harder time. She helps with medications and he takes them by himself, rarely making a mistake. He stopped driving a year ago. He is able to bathe independently but would put on clothes inside out. He underwent Neuropsychological testing with Dr. [name] in March 2021 with note of profound and striking visuospatial impairment including extremely low scores on measures of perceptual functioning and marked constructional apraxia. He also had difficulties with executive functioning. Speed of processing could not be assessed related to extreme difficulty interacting with test forms visually. It was also noted that memory was relatively spared. 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  I personally reviewed brain MRI without contrast done in 12/2019 and 12/2019 did not show interval change, there was moderate diffuse volume loss, slightly more pronounced in the bilateral parietal lobes, right greater than left. It was noted that atrophy pattern is not classic for PCA. He was evaluated by [facility] dementia specialist Dr. [name] in 01/2020 and it was felt that most likely underlying pathological process is Alzheimer's versus frontotemporal lobar degeneration. He was started on Donepezil which caused nausea and diarrhea, switched to Rivastigmine. This  caused worse vomiting, weakness, so he resumed Donepezil 10mg  daily. He was last seen at [facility] in 09/2020. They have decided to stay locally for now and wanted to establish local care.  His wife reports that cognitive issues are not noticeable with  brief conversations. He is more confused at night. They have an aide coming once a week. His son has noticed he has not idea what time of the day it is without cues. She drives him to the office where his sister also works, he sits at his desk and does not do anything. 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 He says he works a 5-day work week. He notes mild depression but he is able to function and does not feel the need to take medication. His wife however states that he is pretty depressed and gets emotional. She shows a video where he has difficulty following instructions, trying to eat with his knife. He starts crying saying he is tired of pushing himself. Sleep is pretty good, no wandering behaviors. No paranoia or hallucinations.   Laboratory Data: Lab Results  Component Value Date   TSH 0.83 11/17/2019   Lab Results  Component Value Date   VITAMINB12 228 11/17/2019   Repeat MRI brain without contrast 12/2020 showed moderate diffuse parenchymal volume loss, slightly more pronounced in the bilateral parietal lobes, right greater than left, more pronounced than expected for age, stable when compared to prior MRI in 2021.   PAST MEDICAL HISTORY: Past Medical History:  Diagnosis Date   Aneurysm of the ascending aorta, without rupture (HCC)    Aortic root dilation (HCC)    Chicken pox    as a child   Dementia (HCC)    Fuch's endothelial dystrophy    GERD (gastroesophageal reflux disease)    off meds - after loosing weight   H/O infectious mononucleosis    in college   Hepatitis     MEDICATIONS: Current Outpatient Medications on File Prior to Visit  Medication Sig Dispense Refill   aspirin EC 81 MG tablet Take 1 tablet (81 mg total) by mouth daily. Swallow whole. 30 tablet 12   escitalopram (LEXAPRO) 20 MG tablet Take 1 and 1/2 tablet daily 45 tablet 11   famotidine  (PEPCID) 20 MG tablet Take 1 tablet (20 mg total) by mouth daily. 90 tablet 3   ipratropium (ATROVENT) 0.06 % nasal spray INSTILL ONE SPRAY IN EACH NOSTRIL FOUR TIMES DAILY 15 mL 1   prednisoLONE acetate (PRED FORTE) 1 % ophthalmic suspension Place 1 drop into both eyes every 4 (four) hours.     No current facility-administered medications on file prior to visit.    ALLERGIES: No Known Allergies  FAMILY HISTORY: Family History  Problem Relation Age of Onset   Cancer Mother        cervical cancer/non-hodgikins lymphoma   Hypertension Mother    Colon polyps Mother    Heart disease Father    Hypertension Father     SOCIAL HISTORY: Social History   Socioeconomic History   Marital status: Married    Spouse name: Not on file   Number of children: 3   Years of education: 81   Highest education level: Not on file  Occupational History   Occupation: lawyer    Employer: [employer]  Tobacco Use   Smoking status: Never  Smokeless tobacco: Never  Vaping Use   Vaping Use: Never used  Substance and Sexual Activity   Alcohol use: Yes    Alcohol/week: 8.0 - 10.0 standard drinks of alcohol    Types: 8 - 10 Standard drinks or equivalent per week    Comment: occ   Drug use: No   Sexual activity: Yes    Partners: Female  Other Topics Concern   Not on file  Social History Narrative   HSG, 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 Married '77- 13/divorced. Married '95.  3 adult children - '82, '85, '86 (step daughter). Work - TEFL teacher.    Left handed    One story   Hot tea and coffee             Social Determinants of Health   Financial Resource Strain: Low Risk  (03/17/2023)   Overall Financial Resource Strain (CARDIA)    Difficulty of Paying Living Expenses: Not hard at all  Food Insecurity: No Food Insecurity (03/17/2023)   Hunger Vital Sign    Worried About Running Out of Food in the Last Year: Never true    Ran Out of Food in the  Last Year: Never true  Transportation Needs: No Transportation Needs (03/17/2023)   PRAPARE - Administrator, Civil Service (Medical): No    Lack of Transportation (Non-Medical): No  Physical Activity: Sufficiently Active (03/17/2023)   Exercise Vital Sign    Days of Exercise per Week: 3 days    Minutes of Exercise per Session: 60 min  Stress: No Stress Concern Present (03/17/2023)   [employer] of Occupational Health - Occupational Stress Questionnaire    Feeling of Stress : Not at all  Social Connections: Moderately Integrated (03/17/2023)   Social Connection and Isolation Panel [NHANES]    Frequency of Communication with Friends and Family: More than three times a week    Frequency of Social Gatherings with Friends and Family: More than three times a week    Attends Religious Services: Never    Database administrator or Organizations: Yes    Attends Engineer, structural: More than 4 times per year    Marital Status: Married  Catering manager Violence: Not At Risk (03/17/2023)   Humiliation, Afraid, Rape, and Kick questionnaire    Fear of Current or Ex-Partner: No    Emotionally Abused: No    Physically Abused: No    Sexually Abused: No     PHYSICAL EXAM: Vitals:   04/22/23 0956  BP: 109/77  Pulse: 82  SpO2: 95%   General: No acute distress Head:  Normocephalic/atraumatic Skin/Extremities: No rash, no edema Neurological Exam: alert and oriented to person, place, season. States it is August 2025. No aphasia or dysarthria. Fund of knowledge is appropriate.  Recent and remote memory are impaired.  Attention and concentration are reduced. He has more difficulties with visuospatial tasks. There is again note of neglect/extinction more on the left side. He is unable to describe pictures accurately, saying there are 5 people in a picture of 2 people. MMSE 14/30  Cranial nerves: Pupils equal, round. Extraocular movements intact with no nystagmus. Visual fields full  on individual testing but has extinction to double simultaneous stimulation.  No facial asymmetry.  Motor: Bulk and tone normal, muscle strength 5/5 throughout with no pronator drift.   Finger to nose testing intact.  Gait narrow-based and steady, no ataxia. No significant tremor.  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 There is continued gradual progression, he continues to have significant visuospatial difficulties but also worsening of executive functioning. MMSE today 14/30 (18/30 in 09/2022). He has had side effects on different dementia medications. Continue Lexapro 30mg  daily, mood is good, he stays positive and keeps active, which was encouraged today. He does not drive. Family would like to keep him in assisted living for as long as possible. Continue close supervision. Follow-up in 6 months, call for any changes.   Thank you for allowing me to participate in his care.  Please do not hesitate to call for any questions or concerns.    Patrcia Dolly, M.D.   CC: Dr. Okey Dupre

## 2023-04-25 ENCOUNTER — Encounter: Payer: Self-pay | Admitting: Neurology

## 2023-04-28 MED ORDER — ESCITALOPRAM OXALATE 20 MG PO TABS: ORAL_TABLET | ORAL | 11 refills | Status: DC

## 2023-04-30 ENCOUNTER — Telehealth (HOSPITAL_COMMUNITY): Payer: Self-pay | Admitting: *Deleted

## 2023-04-30 NOTE — Telephone Encounter (Signed)
Reaching out to patient to offer assistance regarding upcoming cardiac imaging study; pt's wife answered phone and verbalizes understanding of appt date/time, parking situation and where to check in, and verified current allergies; name and call back number provided for further questions should they arise  Larey Brick RN Navigator Cardiac Imaging [facility] [phone] office [phone] cell  Patient's wife states he has had an MRI in the past without incident.

## 2023-05-01 ENCOUNTER — Ambulatory Visit (HOSPITAL_COMMUNITY)
Admission: RE | Admit: 2023-05-01 | Discharge: 2023-05-01 | Disposition: A | Discharge status: Home / Self Care | Payer: Medicare Other | Source: Ambulatory Visit | Attending: Internal Medicine | Admitting: Internal Medicine

## 2023-05-01 ENCOUNTER — Other Ambulatory Visit: Payer: Self-pay | Admitting: Internal Medicine

## 2023-05-01 ENCOUNTER — Ambulatory Visit (HOSPITAL_COMMUNITY)
Admission: RE | Admit: 2023-05-01 | Discharge: 2023-05-01 | Discharge status: Home / Self Care | Payer: Medicare Other | Source: Ambulatory Visit | Attending: Internal Medicine

## 2023-05-01 DIAGNOSIS — I517 Cardiomegaly: Secondary | ICD-10-CM | POA: Diagnosis not present

## 2023-05-01 DIAGNOSIS — I712 Thoracic aortic aneurysm, without rupture, unspecified: Secondary | ICD-10-CM | POA: Insufficient documentation

## 2023-05-01 MED ORDER — GADOBUTROL 1 MMOL/ML IV SOLN
10.0000 mL | Freq: Once | INTRAVENOUS | Status: AC | PRN
  Administered 2023-05-01: 10 mL via INTRAVENOUS

## 2023-05-07 ENCOUNTER — Telehealth: Payer: Self-pay

## 2023-05-08 ENCOUNTER — Other Ambulatory Visit: Payer: Self-pay

## 2023-05-08 DIAGNOSIS — I77810 Thoracic aortic ectasia: Secondary | ICD-10-CM

## 2023-05-08 DIAGNOSIS — I517 Cardiomegaly: Secondary | ICD-10-CM

## 2023-05-08 DIAGNOSIS — I7121 Aneurysm of the ascending aorta, without rupture: Secondary | ICD-10-CM

## 2023-05-30 ENCOUNTER — Ambulatory Visit (HOSPITAL_COMMUNITY): Payer: Medicare Other | Attending: Internal Medicine

## 2023-05-30 DIAGNOSIS — I517 Cardiomegaly: Secondary | ICD-10-CM | POA: Diagnosis present

## 2023-05-30 DIAGNOSIS — I7121 Aneurysm of the ascending aorta, without rupture: Secondary | ICD-10-CM | POA: Diagnosis present

## 2023-05-30 DIAGNOSIS — I77810 Thoracic aortic ectasia: Secondary | ICD-10-CM

## 2023-05-30 LAB — ECHOCARDIOGRAM COMPLETE
Area-P 1/2: 5.06 cm2
S' Lateral: 3.2 cm

## 2023-06-02 ENCOUNTER — Other Ambulatory Visit: Payer: Self-pay

## 2023-06-02 DIAGNOSIS — I517 Cardiomegaly: Secondary | ICD-10-CM

## 2023-06-02 DIAGNOSIS — I77810 Thoracic aortic ectasia: Secondary | ICD-10-CM

## 2023-06-02 DIAGNOSIS — I7121 Aneurysm of the ascending aorta, without rupture: Secondary | ICD-10-CM

## 2023-06-02 DIAGNOSIS — I4819 Other persistent atrial fibrillation: Secondary | ICD-10-CM

## 2023-06-02 NOTE — Progress Notes (Signed)
Order placed for echo due in 1 yr from results of 05/30/23 Echo.

## 2023-06-20 ENCOUNTER — Telehealth: Payer: Self-pay

## 2023-06-20 MED ORDER — ESCITALOPRAM OXALATE 20 MG PO TABS: ORAL_TABLET | ORAL | 3 refills | Status: AC

## 2023-06-20 NOTE — Telephone Encounter (Signed)
I sent in the update Rx for Lexapro 20mg : take 1 and 1/2 tablets daily. Pls send updated instructions to his facility: [phone] Consuello Closs. Thanks

## 2023-06-20 NOTE — Telephone Encounter (Signed)
Dr Karel Jarvis sent in the update Rx for Lexapro 20mg : take 1 and 1/2 tablets daily Order faxed to the facility to [phone]

## 2023-06-20 NOTE — Telephone Encounter (Signed)
Brandon Nielsen calling asking if they can increase his lexapro back to 30 mg because pt is having increase in wandering and change in mood

## 2023-06-26 ENCOUNTER — Encounter: Payer: Self-pay | Admitting: Neurology

## 2023-06-30 MED ORDER — TRAZODONE HCL 50 MG PO TABS: ORAL_TABLET | ORAL | 5 refills | Status: AC

## 2023-06-30 NOTE — Telephone Encounter (Signed)
Pt's wife called in and left a message. The pt is very agitated at night in his assisted living facility. They are worried about him leaving the building. She is wanting to see if there is something that can help his sleep at night?

## 2023-07-03 ENCOUNTER — Encounter: Payer: Self-pay | Admitting: Speech Pathology

## 2023-07-03 NOTE — Therapy (Signed)
This patient was last seen by SLP 05/01/21. SLP closing encounter.

## 2023-08-28 ENCOUNTER — Encounter: Payer: Self-pay | Admitting: Neurology

## 2023-09-02 ENCOUNTER — Ambulatory Visit: Payer: BLUE CROSS/BLUE SHIELD | Admitting: Internal Medicine

## 2023-09-02 ENCOUNTER — Other Ambulatory Visit: Payer: Self-pay

## 2023-09-02 MED ORDER — BUSPIRONE HCL 5 MG PO TABS: 5.0000 mg | ORAL_TABLET | Freq: Two times a day (BID) | ORAL | 5 refills | Status: DC

## 2023-09-02 NOTE — Telephone Encounter (Signed)
Order faxed to [facility]

## 2023-09-05 ENCOUNTER — Ambulatory Visit (INDEPENDENT_AMBULATORY_CARE_PROVIDER_SITE_OTHER): Payer: BLUE CROSS/BLUE SHIELD

## 2023-09-08 ENCOUNTER — Encounter (INDEPENDENT_AMBULATORY_CARE_PROVIDER_SITE_OTHER): Payer: Self-pay

## 2023-09-08 ENCOUNTER — Ambulatory Visit (INDEPENDENT_AMBULATORY_CARE_PROVIDER_SITE_OTHER): Payer: Medicare Other

## 2023-09-08 VITALS — Ht 76.0 in | Wt 226.0 lb

## 2023-09-08 DIAGNOSIS — H6123 Impacted cerumen, bilateral: Secondary | ICD-10-CM

## 2023-09-08 DIAGNOSIS — H9313 Tinnitus, bilateral: Secondary | ICD-10-CM | POA: Diagnosis not present

## 2023-09-09 DIAGNOSIS — H9313 Tinnitus, bilateral: Secondary | ICD-10-CM | POA: Insufficient documentation

## 2023-09-09 DIAGNOSIS — H6123 Impacted cerumen, bilateral: Secondary | ICD-10-CM | POA: Insufficient documentation

## 2023-09-09 NOTE — Progress Notes (Signed)
Patient ID: Brandon Nielsen, male   DOB: 1956/08/07, 66 y.o.   MRN: [id number]  Follow-up: Recurrent cerumen impaction, bilateral tinnitus  HPI: The patient is a 67 year old male who returns today for his follow-up evaluation.  The patient was previously seen for recurrent cerumen impaction and bilateral tinnitus.  He was treated with multiple disimpaction procedure.  The patient returns today complaining of persistent bilateral tinnitus.  He denies any recent change in his hearing.  He has noted increasing clogging sensation in his ears.  He denies any otalgia, otorrhea, or vertigo.  Exam: General: Communicates without difficulty, well nourished, no acute distress. Head: Normocephalic, no evidence injury, no tenderness, facial buttresses intact without stepoff. Face/sinus: No tenderness to palpation and percussion. Facial movement is normal and symmetric. Eyes: PERRL, EOMI. No scleral icterus, conjunctivae clear. Neuro: CN II exam reveals vision grossly intact.  No nystagmus at any point of gaze. Ears: Auricles well formed without lesions. EAC: Bilateral cerumen impaction.  Under the operating microscope, the cerumen is carefully removed with a combination of cerumen currette, alligator forceps, and suction catheters.  After the cerumen is removed, the TMs are noted to be normal.    Nose: External evaluation reveals normal support and skin without lesions.  Dorsum is intact.  Anterior rhinoscopy reveals congested mucosa over anterior aspect of inferior turbinates and intact septum.  No purulence noted. Oral:  Oral cavity and oropharynx are intact, symmetric, without erythema or edema.  Mucosa is moist without lesions. Neck: Full range of motion without pain.  There is no significant lymphadenopathy.  No masses palpable.  Thyroid bed within normal limits to palpation.  Parotid glands and submandibular glands equal bilaterally without mass.  Trachea is midline. Neuro:  CN 2-12 grossly intact.    Assessment: 1.  Bilateral recurrent cerumen impaction.  After the disimpaction procedure, both tympanic membranes and middle ear spaces are noted to be normal. 2.  Stable bilateral subjective tinnitus.  Plan: 1.  Otomicroscopy with bilateral cerumen removal. 2.  The physical exam findings are reviewed with the patient. 3.  The patient will return for reevaluation in 3 months.

## 2023-09-25 ENCOUNTER — Telehealth: Payer: Self-pay | Admitting: Neurology

## 2023-09-25 LAB — LAB REPORT - SCANNED: EGFR: 89

## 2023-09-25 NOTE — Telephone Encounter (Signed)
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

## 2023-09-25 NOTE — Telephone Encounter (Signed)
Caller stated pt has had an episode at facility. Stated its the second one he's had since March. [facility] in house doctors reccommended pt be evaluated for seizures.

## 2023-09-26 ENCOUNTER — Encounter: Payer: Self-pay | Admitting: Neurology

## 2023-09-26 ENCOUNTER — Ambulatory Visit (INDEPENDENT_AMBULATORY_CARE_PROVIDER_SITE_OTHER): Payer: Medicare Other | Admitting: Neurology

## 2023-09-26 VITALS — BP 102/72 | HR 68 | Ht 76.0 in | Wt 221.2 lb

## 2023-09-26 DIAGNOSIS — R404 Transient alteration of awareness: Secondary | ICD-10-CM

## 2023-09-26 DIAGNOSIS — F02818 Dementia in other diseases classified elsewhere, unspecified severity, with other behavioral disturbance: Secondary | ICD-10-CM | POA: Diagnosis not present

## 2023-09-26 DIAGNOSIS — G300 Alzheimer's disease with early onset: Secondary | ICD-10-CM

## 2023-09-26 NOTE — Patient Instructions (Signed)
Always a pleasure to see you.  Schedule MRI brain with and without contrast  2. Schedule 1-hour EEG  3. Continue all your medications, please ask [facility] to fax a copy of your updated medication list (our fax number is [phone])  4. Continue close supervision  5. Follow-up as scheduled in January, or earlier if needed

## 2023-09-26 NOTE — Progress Notes (Signed)
NEUROLOGY FOLLOW UP OFFICE NOTE  Brandon Nielsen [id number] Mar 04, 1956  HISTORY OF PRESENT ILLNESS: I had the pleasure of seeing Brandon Nielsen in follow-up in the neurology clinic on 09/26/2023.  The patient was last seen 5 months ago for early onset Alzheimer's dementia. He is again accompanied by his son Brandon Nielsen, as well as his caregiver Brandon Nielsen who help supplement the history today.  Records and images were personally reviewed where available.  Since his last visit, they called yesterday due to concern for seizures.  He states "I just sort of fell out," he recalls being picked up but does not remember what he was doing. Brandon Nielsen reports that he was at breakfast and could not stand up. He then became disoriented and tried to pick up an imaginary cup, taking a drink. He shook a little then his right hand went into a fist and he passed out. He had a grayish color and right arm was jerking for around 2 minutes. As he started waking up, breathing was labored as he was bent over. No tongue bite or incontinence. He had another incident 6 months ago while showering, he was standing and slumped back and slid down, out briefly. Another time he had a bowel movement, then after he got up and started walking, he became shaky and his aide let him to the bed where he briefly passed out. He has had numerous episodes of disorientation/confusion that have increased significantly the past few months. Some days he would be sharp as a tack, other times he is unable to follow instructions. Brandon Nielsen reports "there is an opposition." The agitation has improved, he is also sleeping much better. He had been started on Trazodone. He has been in a pretty good month the past month, he is on Lexapro 30mg  daily. They are unsure if he is on the Buspar, MAR has been requested for review.   He denies any olfactory/gustatory hallucinations, focal numbness/tingling/weakness. No headaches, dizziness. He denies any palpitations and has seen  Cardiology. Speech is tangential at times, he states he could see his heart beat when he had a cardiac MRI. He has been complaining about raining a lot, with rain keeping him up. He thinks some thinks happening on the news are happening at Brandon Nielsen. He moves to a new room (Brandon Nielsen) last week. Brandon Nielsen has also noticed the left-sided neglect, he only shaves the right side of his face.   History on Initial Assessment 03/02/2021: This is a pleasant 67 year old left-handed man with a history of GERD, presenting to establish care for early onset Alzheimer's disease. His wife Brandon Nielsen is present to provide additional information. She started noticing confusion around 7-8 years ago, worse in the past year. She reports he has changed so much in his ability to function. She took over finances 2-3 years ago because he was having a harder time. She helps with medications and he takes them by himself, rarely making a mistake. He stopped driving a year ago. He is able to bathe independently but would put on clothes inside out. He underwent Neuropsychological testing with Dr. [name] in March 2021 with note of profound and striking visuospatial impairment including extremely low scores on measures of perceptual functioning and marked constructional apraxia. He also had difficulties with executive functioning. Speed of processing could not be assessed related to extreme difficulty interacting with test forms visually. It was also noted that memory was relatively spared. 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  I personally reviewed brain MRI without contrast done in 12/2019 and 12/2019 did not show interval change, there was moderate diffuse volume loss, slightly more pronounced in the bilateral parietal lobes, right greater than left. It was noted that atrophy pattern is not classic for PCA.  He was evaluated by [facility] dementia specialist Dr. [name] in 01/2020 and it was felt that most likely underlying pathological process is Alzheimer's versus frontotemporal lobar degeneration. He was started on Donepezil which caused nausea and diarrhea, switched to Rivastigmine. This caused worse vomiting, weakness, so he resumed Donepezil 10mg  daily. He was last seen at [facility] in 09/2020. They have decided to stay locally for now and wanted to establish local care.  His wife reports that cognitive issues are not noticeable with brief conversations. He is more confused at night. They have an aide coming once a week. His son has noticed he has not idea what time of the day it is without cues. She drives him to the office where his sister also works, he sits at his desk and does not do anything. 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 He says he works a 5-day work week. He notes mild depression but he is able to function and does not feel the need to take medication. His wife however states that he is pretty depressed and gets emotional. She shows a video where he has difficulty following instructions, trying to eat with his knife. He starts crying saying he is tired of pushing himself. Sleep is pretty good, no wandering behaviors. No paranoia or hallucinations.   Laboratory Data: Lab Results  Component Value Date   TSH 0.83 11/17/2019   Lab Results  Component Value Date   VITAMINB12 228 11/17/2019   Repeat MRI brain without contrast 12/2020 showed moderate diffuse parenchymal volume loss, slightly more pronounced in the bilateral parietal lobes, right greater than left, more pronounced than expected for age, stable when compared to prior MRI in 2021.  PAST MEDICAL HISTORY: Past Medical History:  Diagnosis Date   Aneurysm of the ascending aorta, without rupture (HCC)    Aortic root dilation (HCC)     Chicken pox    as a child   Dementia (HCC)    Fuch's endothelial dystrophy    GERD (gastroesophageal reflux disease)    off meds - after loosing weight   H/O infectious mononucleosis    in college   Hepatitis     MEDICATIONS: Current Outpatient Medications on File Prior to Visit  Medication Sig Dispense Refill   aspirin EC 81 MG tablet Take 1 tablet (81 mg total) by mouth daily. Swallow whole. 30 tablet 12   busPIRone (BUSPAR) 5 MG tablet Take 1 tablet (5 mg total) by mouth 2 (two) times daily. 60 tablet 5   escitalopram (LEXAPRO) 20 MG tablet Take 1 and 1/2 tablets daily 135 tablet 3   famotidine (PEPCID) 20 MG tablet Take 1 tablet (20 mg total) by mouth daily. 90 tablet 3   ipratropium (ATROVENT) 0.06 % nasal spray INSTILL ONE SPRAY IN EACH NOSTRIL FOUR TIMES DAILY 15 mL 1   loperamide (IMODIUM) 2 MG capsule Take 2 mg by mouth as needed for diarrhea or loose stools.     prednisoLONE acetate (PRED FORTE) 1 % ophthalmic  suspension Place 1 drop into both eyes every 4 (four) hours.     psyllium (METAMUCIL) 58.6 % packet Take 1 packet by mouth daily.     traZODone (DESYREL) 50 MG tablet Take 1/2 tablet every night for 1 week, then increase to 1 tablet every night 30 tablet 5   No current facility-administered medications on file prior to visit.    ALLERGIES: No Known Allergies  FAMILY HISTORY: Family History  Problem Relation Age of Onset   Cancer Mother        cervical cancer/non-hodgikins lymphoma   Hypertension Mother    Colon polyps Mother    Heart disease Father    Hypertension Father     SOCIAL HISTORY: Social History   Socioeconomic History   Marital status: Married    Spouse name: Not on file   Number of children: 3   Years of education: 92   Highest education level: Not on file  Occupational History   Occupation: Scientist, research (medical): [employer]  Tobacco Use   Smoking status: Never   Smokeless tobacco: Never  Vaping Use   Vaping status: Never  Used  Substance and Sexual Activity   Alcohol use: Yes    Alcohol/week: 8.0 - 10.0 standard drinks of alcohol    Types: 8 - 10 Standard drinks or equivalent per week    Comment: occ   Drug use: No   Sexual activity: Yes    Partners: Female  Other Topics Concern   Not on file  Social History Narrative   [employer], 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 Married '77- 13/divorced. Married '95.  3 adult children - '82, '85, '86 (step daughter). Work - TEFL teacher.    Left handed    One story   Hot tea and coffee             Social Drivers of Health   Financial Resource Strain: Low Risk  (03/17/2023)   Overall Financial Resource Strain (CARDIA)    Difficulty of Paying Living Expenses: Not hard at all  Food Insecurity: No Food Insecurity (03/17/2023)   Hunger Vital Sign    Worried About Running Out of Food in the Last Year: Never true    Ran Out of Food in the Last Year: Never true  Transportation Needs: No Transportation Needs (03/17/2023)   PRAPARE - Administrator, Civil Service (Medical): No    Lack of Transportation (Non-Medical): No  Physical Activity: Sufficiently Active (03/17/2023)   Exercise Vital Sign    Days of Exercise per Week: 3 days    Minutes of Exercise per Session: 60 min  Stress: No Stress Concern Present (03/17/2023)   [employer] of Occupational Health - Occupational Stress Questionnaire    Feeling of Stress : Not at all  Social Connections: Moderately Integrated (03/17/2023)   Social Connection and Isolation Panel [NHANES]    Frequency of Communication with Friends and Family: More than three times a week    Frequency of Social Gatherings with Friends and Family: More than three times a week    Attends Religious Services: Never    Database administrator or Organizations: Yes    Attends Engineer, structural: More than 4 times per year    Marital Status: Married  Catering manager Violence: Not At Risk (03/17/2023)    Humiliation, Afraid, Rape, and Kick questionnaire    Fear of Current or Ex-Partner: No    Emotionally  Abused: No    Physically Abused: No    Sexually Abused: No     PHYSICAL EXAM: Vitals:   09/26/23 1125  BP: 102/72  Pulse: 68  SpO2: 97%   General: No acute distress Head:  Normocephalic/atraumatic Skin/Extremities: No rash, no edema Neurological Exam: alert and awake. No aphasia or dysarthria, speech tangential at times. Fund of knowledge is reduced. Attention and concentration are reduced. Cranial nerves: Pupils equal, round. Extraocular movements intact with no nystagmus. Neglect on left side, he has difficulty counting fingers on the left. No facial asymmetry.  Motor: Bulk and tone normal, muscle strength 5/5 throughout with no pronator drift.   Finger to nose testing intact.  Gait narrow-based and steady, no ataxia. No tremors.    IMPRESSION: This is a pleasant 67 yo LH man with a history of GERD, with early onset Alzheimer's disease, possibly posterior cortical atrophy. He has had a change in symptoms since last visit, he has had 2 episodes suggestive of vasovagal syncope, however episode yesterday had focal symptoms with right hand ?dystonia and jerking associated with decreased responsiveness. He has left-sided neglect. MRI brain with and without contrast and 1-hour EEG will be ordered. Continue current medications for now. Continue 24/7 care. Follow-up as scheduled in January, call for any changes.     Thank you for allowing me to participate in his care.  Please do not hesitate to call for any questions or concerns.    Patrcia Dolly, M.D.   CC: Dr. Okey Dupre

## 2023-09-30 ENCOUNTER — Ambulatory Visit (INDEPENDENT_AMBULATORY_CARE_PROVIDER_SITE_OTHER): Payer: Medicare Other | Admitting: Neurology

## 2023-09-30 DIAGNOSIS — R404 Transient alteration of awareness: Secondary | ICD-10-CM

## 2023-09-30 DIAGNOSIS — F02818 Dementia in other diseases classified elsewhere, unspecified severity, with other behavioral disturbance: Secondary | ICD-10-CM

## 2023-09-30 DIAGNOSIS — G300 Alzheimer's disease with early onset: Secondary | ICD-10-CM

## 2023-09-30 NOTE — Progress Notes (Unsigned)
EEG complete and ready for review.

## 2023-10-01 NOTE — Procedures (Signed)
ELECTROENCEPHALOGRAM REPORT  Date of Study: 09/30/2023  Patient's Name: Brandon Nielsen MRN: [id number] Date of Birth: 1956-02-15  Referring Provider: Dr. Patrcia Dolly  Clinical History: This is a 67 year old man with early onset dementia and new onset episodes of loss of consciousness, recent episode of confusion with right hand dystonic posturing/jerking. EEG for classification.  Medications: Lexapro, Buspar  Technical Summary: A multichannel digital 1-hour EEG recording measured by the international 10-20 system with electrodes applied with paste and impedances below 5000 ohms performed in our laboratory with EKG monitoring in an awake and asleep patient.  Hyperventilation and photic stimulation were not performed.  The digital EEG was referentially recorded, reformatted, and digitally filtered in a variety of bipolar and referential montages for optimal display.    Description: The patient is awake and asleep during the recording.  There is no clear posterior dominant rhythm seen. The record is symmetric.  During drowsiness and sleep, there is an increase in theta slowing of the background with poorly formed vertex waves seen. There were no epileptiform discharges or electrographic seizures seen.    EKG lead showed irregular rhythm at 48-60 bpm.  Impression: This 1-hour awake and asleep EEG is normal.    Clinical Correlation: A normal EEG does not exclude a clinical diagnosis of epilepsy.  If further clinical questions remain, prolonged EEG may be helpful.  Clinical correlation is advised.   Patrcia Dolly, M.D.

## 2023-10-02 ENCOUNTER — Telehealth: Payer: Self-pay

## 2023-10-02 NOTE — Telephone Encounter (Signed)
-----   Message from Van Clines sent at 10/02/2023 11:51 AM EST ----- Pls let family know the EEG is normal, proceed with brain MRI, thanks

## 2023-10-02 NOTE — Telephone Encounter (Signed)
Pt son called an informed  EEG is normal, proceed with brain MRI,

## 2023-11-10 ENCOUNTER — Ambulatory Visit
Admission: RE | Admit: 2023-11-10 | Discharge: 2023-11-10 | Disposition: A | Discharge status: Home / Self Care | Payer: Medicare Other | Source: Ambulatory Visit | Attending: Neurology | Admitting: Neurology

## 2023-11-10 DIAGNOSIS — G300 Alzheimer's disease with early onset: Secondary | ICD-10-CM

## 2023-11-10 DIAGNOSIS — R404 Transient alteration of awareness: Secondary | ICD-10-CM

## 2023-11-10 MED ORDER — GADOPICLENOL 0.5 MMOL/ML IV SOLN
9.0000 mL | Freq: Once | INTRAVENOUS | Status: AC | PRN
  Administered 2023-11-10: 9 mL via INTRAVENOUS

## 2023-11-14 ENCOUNTER — Ambulatory Visit: Payer: Medicare Other | Admitting: Neurology

## 2023-11-14 ENCOUNTER — Other Ambulatory Visit: Payer: Medicare Other

## 2023-11-14 ENCOUNTER — Encounter: Payer: Self-pay | Admitting: Neurology

## 2023-11-14 VITALS — BP 102/73 | HR 66 | Ht 76.0 in | Wt 218.4 lb

## 2023-11-14 DIAGNOSIS — G300 Alzheimer's disease with early onset: Secondary | ICD-10-CM | POA: Diagnosis not present

## 2023-11-14 DIAGNOSIS — Z8673 Personal history of transient ischemic attack (TIA), and cerebral infarction without residual deficits: Secondary | ICD-10-CM

## 2023-11-14 DIAGNOSIS — E7849 Other hyperlipidemia: Secondary | ICD-10-CM

## 2023-11-14 DIAGNOSIS — R569 Unspecified convulsions: Secondary | ICD-10-CM

## 2023-11-14 DIAGNOSIS — F02818 Dementia in other diseases classified elsewhere, unspecified severity, with other behavioral disturbance: Secondary | ICD-10-CM

## 2023-11-14 DIAGNOSIS — R739 Hyperglycemia, unspecified: Secondary | ICD-10-CM | POA: Diagnosis not present

## 2023-11-14 MED ORDER — LAMOTRIGINE 25 MG PO TABS: ORAL_TABLET | ORAL | 6 refills | Status: DC

## 2023-11-14 NOTE — Progress Notes (Signed)
NEUROLOGY FOLLOW UP OFFICE NOTE  Brandon Nielsen [id number] [birth year]  HISTORY OF PRESENT ILLNESS: I had the pleasure of seeing Brandon Nielsen in follow-up in the neurology clinic on 11/14/2023.  The patient was last seen 6 weeks ago for early onset Alzheimer's dementia and new onset seizure. He is again accompanied by his wife [name] and caregiver [name] who help supplement the history today.  Records and images were personally reviewed where available.  On his last visit, the reported a change in symptoms, he had 2 episodes suggestive of vasovagal syncope, however had an episode on 09/25/23 with  focal symptoms with right hand ?dystonia and jerking associated with decreased responsiveness. Exam showed left-sided neglect. His 1-hour wake and sleep EEG in 09/2023 was normal. He had a brain MRI with and without contrast 4 days ago, report was unavailable for review during his visit, on my review there was increased FLAIR signal on the right posterior parietal region, new from MRI in 2022. 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 I discussed physical symptoms with neuroradiology, she stated that it could be chronic microvascular disease but could not rule out the possibility of an old infarct in the region. There was also age-advanced diffuse parenchymal volume loss, progressed from prior MRI.   Since his last visit, they deny any similar episodes of disorientation, loss of consciousness, right hand jerking. He feels shaky like something is about to happen, this was occurring fairly often but it has been a week now without him reporting these symptoms. They note his walking has slowed considerably, no falls. Nights are becoming pretty difficult, Trazodone helps. He naps from 8pm-28mn, then he is pretty active from 12am-3am with at lot of outgoing phone calls these times. He has  problem swallowing a little bit with water, he has speech therapy.    History on Initial Assessment 03/02/2021: This is a pleasant 68 year old left-handed man with a history of GERD, presenting to establish care for early onset Alzheimer's disease. His wife [name] is present to provide additional information. She started noticing confusion around 7-8 years ago, worse in the past year. She reports he has changed so much in his ability to function. She took over finances 2-3 years ago because he was having a harder time. She helps with medications and he takes them by himself, rarely making a mistake. He stopped driving a year ago. He is able to bathe independently but would put on clothes inside out. He underwent Neuropsychological testing with Dr. [name] in March 2021 with note of profound and striking visuospatial impairment including extremely low scores on measures of perceptual functioning and marked constructional apraxia. He also had difficulties with executive functioning. Speed of processing could not be assessed related to extreme difficulty interacting with test forms visually. It was also noted that memory was relatively spared. 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  I personally reviewed brain MRI without contrast done in 12/2019 and 12/2019 did not show interval change, there was moderate diffuse volume loss, slightly more pronounced in the bilateral parietal lobes, right greater than left. It was noted that atrophy pattern is not classic for PCA. He was evaluated by [facility] dementia specialist Dr. [name] in 01/2020 and it was felt that most likely underlying pathological process is Alzheimer's versus frontotemporal lobar  degeneration. He was started on Donepezil which caused nausea and diarrhea, switched to Rivastigmine. This caused worse vomiting, weakness, so he  resumed Donepezil 10mg  daily. He was last seen at [facility] in 09/2020. They have decided to stay locally for now and wanted to establish local care.  His wife reports that cognitive issues are not noticeable with brief conversations. He is more confused at night. They have an aide coming once a week. His son has noticed he has not idea what time of the day it is without cues. She drives him to the office where his sister also works, he sits at his desk and does not do anything. 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 He says he works a 5-day work week. He notes mild depression but he is able to function and does not feel the need to take medication. His wife however states that he is pretty depressed and gets emotional. She shows a video where he has difficulty following instructions, trying to eat with his knife. He starts crying saying he is tired of pushing himself. Sleep is pretty good, no wandering behaviors. No paranoia or hallucinations.   Update 09/26/2023: He states "I just sort of fell out," he recalls being picked up but does not remember what he was doing. Ray reports that he was at breakfast and could not stand up. He then became disoriented and tried to pick up an imaginary cup, taking a drink. He shook a little then his right hand went into a fist and he passed out. He had a grayish color and right arm was jerking for around 2 minutes. As he started waking up, breathing was labored as he was bent over. No tongue bite or incontinence. He had another incident 6 months ago while showering, he was standing and slumped back and slid down, out briefly. Another time he had a bowel movement, then after he got up and started walking, he became shaky and his aide let him to the bed where he briefly passed out. He has had numerous episodes of disorientation/confusion that have increased significantly  the past few months. Some days he would be sharp as a tack, other times he is unable to follow instructions. Ray reports "there is an opposition." The agitation has improved, he is also sleeping much better. He had been started on Trazodone. He has been in a pretty good month the past month, he is on Lexapro 30mg  daily. They are unsure if he is on the Buspar, MAR has been requested for review.   Laboratory Data: Lab Results  Component Value Date   TSH 0.83 11/17/2019   Lab Results  Component Value Date   VITAMINB12 228 11/17/2019   Repeat MRI brain without contrast 12/2020 showed moderate diffuse parenchymal volume loss, slightly more pronounced in the bilateral parietal lobes, right greater than left, more pronounced than expected for age, stable when compared to prior MRI in 2021.  PAST MEDICAL HISTORY: Past Medical History:  Diagnosis Date   Aneurysm of the ascending aorta, without rupture (HCC)    Aortic root dilation (HCC)    Chicken pox    as a child   Dementia (HCC)    Fuch's endothelial dystrophy    GERD (gastroesophageal reflux disease)    off meds - after loosing weight   H/O infectious mononucleosis    in  college   Hepatitis     MEDICATIONS: Current Outpatient Medications on File Prior to Visit  Medication Sig Dispense Refill   aspirin EC 81 MG tablet Take 1 tablet (81 mg total) by mouth daily. Swallow whole. 30 tablet 12   busPIRone (BUSPAR) 5 MG tablet Take 1 tablet (5 mg total) by mouth 2 (two) times daily. 60 tablet 5   escitalopram (LEXAPRO) 20 MG tablet Take 1 and 1/2 tablets daily (Patient taking differently: Take 20 mg by mouth daily. Take 1 tablet daily) 135 tablet 3   famotidine (PEPCID) 20 MG tablet Take 1 tablet (20 mg total) by mouth daily. 90 tablet 3   ipratropium (ATROVENT) 0.06 % nasal spray INSTILL ONE SPRAY IN EACH NOSTRIL FOUR TIMES DAILY 15 mL 1   loperamide (IMODIUM) 2 MG capsule Take 2 mg by mouth as needed for diarrhea or loose stools.      prednisoLONE acetate (PRED FORTE) 1 % ophthalmic suspension Place 1 drop into both eyes every 4 (four) hours.     psyllium (METAMUCIL) 58.6 % packet Take 1 packet by mouth daily.     traZODone (DESYREL) 50 MG tablet Take 1/2 tablet every night for 1 week, then increase to 1 tablet every night (Patient taking differently: Take 50 mg by mouth at bedtime. Take 1 tablet every night) 30 tablet 5   No current facility-administered medications on file prior to visit.    ALLERGIES: No Known Allergies  FAMILY HISTORY: Family History  Problem Relation Age of Onset   Cancer Mother        cervical cancer/non-hodgikins lymphoma   Hypertension Mother    Colon polyps Mother    Heart disease Father    Hypertension Father     SOCIAL HISTORY: Social History   Socioeconomic History   Marital status: Married    Spouse name: Not on file   Number of children: 3   Years of education: 8   Highest education level: Not on file  Occupational History   Occupation: Scientist, research (medical): [employer]  Tobacco Use   Smoking status: Never   Smokeless tobacco: Never  Vaping Use   Vaping status: Never Used  Substance and Sexual Activity   Alcohol use: Yes    Alcohol/week: 8.0 - 10.0 standard drinks of alcohol    Types: 8 - 10 Standard drinks or equivalent per week    Comment: occ   Drug use: No   Sexual activity: Yes    Partners: Female  Other Topics Concern   Not on file  Social History Narrative   HS[employer], 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 Married '77- 13/divorced. Married '95.  3 adult children - '82, '85, '86 (step daughter). Work - TEFL teacher.    Left handed    One story   Hot tea and coffee             Social Drivers of Health   Financial Resource Strain: Low Risk  (03/17/2023)   Overall Financial Resource Strain (CARDIA)    Difficulty of Paying Living Expenses: Not hard at all  Food Insecurity: No Food Insecurity (03/17/2023)   Hunger Vital Sign     Worried About Running Out of Food in the Last Year: Never true    Ran Out of Food in the Last Year: Never true  Transportation Needs: No Transportation Needs (03/17/2023)   PRAPARE - Administrator, Civil Service (Medical): No  Lack of Transportation (Non-Medical): No  Physical Activity: Sufficiently Active (03/17/2023)   Exercise Vital Sign    Days of Exercise per Week: 3 days    Minutes of Exercise per Session: 60 min  Stress: No Stress Concern Present (03/17/2023)   [employer] of Occupational Health - Occupational Stress Questionnaire    Feeling of Stress : Not at all  Social Connections: Moderately Integrated (03/17/2023)   Social Connection and Isolation Panel [NHANES]    Frequency of Communication with Friends and Family: More than three times a week    Frequency of Social Gatherings with Friends and Family: More than three times a week    Attends Religious Services: Never    Database administrator or Organizations: Yes    Attends Engineer, structural: More than 4 times per year    Marital Status: Married  Catering manager Violence: Not At Risk (03/17/2023)   Humiliation, Afraid, Rape, and Kick questionnaire    Fear of Current or Ex-Partner: No    Emotionally Abused: No    Physically Abused: No    Sexually Abused: No     PHYSICAL EXAM: Vitals:   11/14/23 0957  BP: 102/73  Pulse: 66  SpO2: 97%   General: No acute distress Head:  Normocephalic/atraumatic Skin/Extremities: No rash, no edema Neurological Exam: alert and awake. No aphasia or dysarthria. Fund of knowledge is reduced.  Recent and remote memory are impaired. Attention and concentration are reduced. Cranial nerves: Pupils equal, round. Extraocular movements intact with no nystagmus. Left-sided neglect, difficulty counting fingers. He is able to recognize his left hand but has neglect on the left during testing. No facial asymmetry.  Motor: Bulk and tone normal, muscle strength 5/5  throughout with no pronator drift.   Finger to nose testing intact.  Gait narrow-based and steady, no ataxia. No tremors.    IMPRESSION: This is a pleasant 69 yo LH man with a history of GERD, with early onset Alzheimer's disease, possibly posterior cortical atrophy. He has had 2 syncopal episodes suggestive of vasovagal syncope, then an episode on 09/25/23 with focal symptoms of right hand ?dystonia and jerking associated with decreased responsiveness. EEG normal. MRI brain no acute changes, there is note of progression of atrophy and microvascular disease, asymmetric (more on the right posterior parietal region), chronic infarct could not be excluded. Discussed checking lipid panel, HbA1c for vascular risk factors, continue daily aspirin. We discussed dementia as a risk factor for seizure, and agreed to start Lamotrigine 25mg  BID for 2 weeks, then increase to 50mg  BID. Side effects, including Levonne Spiller syndrome, were discussed. Continue 24/7 care. Follow-up in 3 months, call for any changes.     Thank you for allowing me to participate in his care.  Please do not hesitate to call for any questions or concerns.    Patrcia Dolly, M.D.   CC: Dr. Okey Dupre

## 2023-11-14 NOTE — Patient Instructions (Signed)
Always good to see you.  Have bloodwork done for lipid panel, HbA1c  2. Start Lamotrigine (Lamictal) 25mg : take 1 tablet twice a day for 2 weeks, then increase to 2 tablets twice a day  3. Continue daily aspirin, all your other medications  4. Follow-up in 3 months, call for any changes   Seizure Precautions: 1. If medication has been prescribed for you to prevent seizures, take it exactly as directed.  Do not stop taking the medicine without talking to your doctor first, even if you have not had a seizure in a long time.   2. Avoid activities in which a seizure would cause danger to yourself or to others.  Don't operate dangerous machinery, swim alone, or climb in high or dangerous places, such as on ladders, roofs, or girders.  Do not drive unless your doctor says you may.  3. If you have any warning that you may have a seizure, lay down in a safe place where you can't hurt yourself.    4.  No driving for 6 months from last seizure, as per [facility].   Please refer to the following link on the Epilepsy Foundation of America's website for more information: http://www.epilepsyfoundation.org/answerplace/Social/driving/drivingu.cfm   5.  Maintain good sleep hygiene.  6.  Contact your doctor if you have any problems that may be related to the medicine you are taking.  7.  Call 911 and bring the patient back to the ED if:        A.  The seizure lasts longer than 5 minutes.       B.  The patient doesn't awaken shortly after the seizure  C.  The patient has new problems such as difficulty seeing, speaking or moving  D.  The patient was injured during the seizure  E.  The patient has a temperature over 102 F (39C)  F.  The patient vomited and now is having trouble breathing

## 2023-11-15 LAB — HEMOGLOBIN A1C
Hgb A1c MFr Bld: 5.6 %{Hb} (ref <5.7)
Mean Plasma Glucose: 114 mg/dL
eAG (mmol/L): 6.3 mmol/L

## 2023-11-15 LAB — LIPID PANEL
Cholesterol: 180 mg/dL (ref <200)
HDL: 61 mg/dL (ref >=40)
LDL Cholesterol (Calc): 91 mg/dL
Non-HDL Cholesterol (Calc): 119 mg/dL (ref <130)
Total CHOL/HDL Ratio: 3 (calc) (ref <5.0)
Triglycerides: 190 mg/dL — ABNORMAL HIGH (ref <150)

## 2023-11-28 ENCOUNTER — Encounter: Payer: Self-pay | Admitting: Neurology

## 2023-12-01 ENCOUNTER — Telehealth: Payer: Self-pay

## 2023-12-01 NOTE — Telephone Encounter (Signed)
-----   Message from Van Clines sent at 11/28/2023 12:21 AM EST ----- Pls let his wife know his cholesterol level is elevated, LDL 91, goal is less than 70. Can she discuss starting cholesterol medication with PCP? Thanks

## 2023-12-01 NOTE — Telephone Encounter (Signed)
Pt wife called informed cholesterol level is elevated, LDL 91, goal is less than 70. Can she discuss starting cholesterol medication with PCP

## 2023-12-11 ENCOUNTER — Emergency Department (HOSPITAL_COMMUNITY)
Admission: EM | Admit: 2023-12-11 | Discharge: 2023-12-11 | Disposition: A | Discharge status: Home / Self Care | Payer: Medicare Other | Attending: Emergency Medicine | Admitting: Emergency Medicine

## 2023-12-11 ENCOUNTER — Encounter (HOSPITAL_COMMUNITY): Payer: Self-pay | Admitting: Emergency Medicine

## 2023-12-11 ENCOUNTER — Emergency Department (HOSPITAL_COMMUNITY): Payer: Medicare Other

## 2023-12-11 ENCOUNTER — Other Ambulatory Visit: Payer: Self-pay

## 2023-12-11 ENCOUNTER — Encounter: Payer: Self-pay | Admitting: Neurology

## 2023-12-11 DIAGNOSIS — D72829 Elevated white blood cell count, unspecified: Secondary | ICD-10-CM | POA: Insufficient documentation

## 2023-12-11 DIAGNOSIS — R4689 Other symptoms and signs involving appearance and behavior: Secondary | ICD-10-CM | POA: Insufficient documentation

## 2023-12-11 DIAGNOSIS — Z79899 Other long term (current) drug therapy: Secondary | ICD-10-CM | POA: Insufficient documentation

## 2023-12-11 DIAGNOSIS — F0390 Unspecified dementia without behavioral disturbance: Secondary | ICD-10-CM | POA: Insufficient documentation

## 2023-12-11 LAB — RAPID URINE DRUG SCREEN, HOSP PERFORMED
Amphetamines: NOT DETECTED
Barbiturates: NOT DETECTED
Benzodiazepines: NOT DETECTED
Cocaine: NOT DETECTED
Opiates: NOT DETECTED
Tetrahydrocannabinol: NOT DETECTED

## 2023-12-11 LAB — CBC WITH DIFFERENTIAL/PLATELET
Abs Immature Granulocytes: 0.1 10*3/uL — ABNORMAL HIGH (ref 0.00–0.07)
Basophils Absolute: 0.1 10*3/uL (ref 0.0–0.1)
Basophils Relative: 1 %
Eosinophils Absolute: 0.1 10*3/uL (ref 0.0–0.5)
Eosinophils Relative: 1 %
HCT: 41.6 % (ref 39.0–52.0)
Hemoglobin: 14.1 g/dL (ref 13.0–17.0)
Immature Granulocytes: 1 %
Lymphocytes Relative: 18 %
Lymphs Abs: 2 10*3/uL (ref 0.7–4.0)
MCH: 30.3 pg (ref 26.0–34.0)
MCHC: 33.9 g/dL (ref 30.0–36.0)
MCV: 89.5 fL (ref 80.0–100.0)
Monocytes Absolute: 0.7 10*3/uL (ref 0.1–1.0)
Monocytes Relative: 7 %
Neutro Abs: 7.7 10*3/uL (ref 1.7–7.7)
Neutrophils Relative %: 72 %
Platelets: 252 10*3/uL (ref 150–400)
RBC: 4.65 MIL/uL (ref 4.22–5.81)
RDW: 13 % (ref 11.5–15.5)
WBC: 10.6 10*3/uL — ABNORMAL HIGH (ref 4.0–10.5)
nRBC: 0 % (ref 0.0–0.2)

## 2023-12-11 LAB — URINALYSIS, ROUTINE W REFLEX MICROSCOPIC
Bilirubin Urine: NEGATIVE
Glucose, UA: NEGATIVE mg/dL
Hgb urine dipstick: NEGATIVE
Ketones, ur: NEGATIVE mg/dL
Leukocytes,Ua: NEGATIVE
Nitrite: NEGATIVE
Protein, ur: NEGATIVE mg/dL
Specific Gravity, Urine: 1.01 (ref 1.005–1.030)
pH: 7 (ref 5.0–8.0)

## 2023-12-11 LAB — COMPREHENSIVE METABOLIC PANEL
ALT: 31 U/L (ref 0–44)
AST: 27 U/L (ref 15–41)
Albumin: 3.7 g/dL (ref 3.5–5.0)
Alkaline Phosphatase: 64 U/L (ref 38–126)
Anion gap: 7 (ref 5–15)
BUN: 11 mg/dL (ref 8–23)
CO2: 27 mmol/L (ref 22–32)
Calcium: 8.8 mg/dL — ABNORMAL LOW (ref 8.9–10.3)
Chloride: 103 mmol/L (ref 98–111)
Creatinine, Ser: 1.06 mg/dL (ref 0.61–1.24)
GFR, Estimated: >60 mL/min (ref >60)
Glucose, Bld: 93 mg/dL (ref 70–99)
Potassium: 3.8 mmol/L (ref 3.5–5.1)
Sodium: 137 mmol/L (ref 135–145)
Total Bilirubin: 0.9 mg/dL (ref 0.0–1.2)
Total Protein: 6.5 g/dL (ref 6.5–8.1)

## 2023-12-11 LAB — ETHANOL: Alcohol, Ethyl (B): <10 mg/dL (ref <10)

## 2023-12-11 NOTE — Discharge Instructions (Signed)
 Evaluation in the ED today has been very reassuring no evidence of urinary tract infection pneumonia or other infection, electrolytes, kidney and liver function look normal and head CT and chest x-ray look good today as well.  Changes in behavior could be due to medication or progression of dementia.  Please follow-up with Dr. Karel Jarvis for further treatment recommendations.  Return to the ED for any new or worsening symptoms.

## 2023-12-11 NOTE — ED Notes (Signed)
 Tried to call report and no one answered. Left message

## 2023-12-11 NOTE — ED Notes (Signed)
 Family requested to take this pt back to the facility. [facility] called and canceled. Family has paperwork.

## 2023-12-11 NOTE — ED Triage Notes (Signed)
 BIBA Per EMS: pt coming from [facility]. Facility reports "odd behavior" Pt walking around nude. Hx dementia.  VSS

## 2023-12-11 NOTE — ED Provider Notes (Signed)
 [name] Provider Note   CSN: [id number] Arrival date & time: 12/11/23  4010     History  Chief Complaint  Patient presents with   Medical Clearance    Brandon Nielsen is a 68 y.o. male.  Brandon Nielsen is a 68 y.o. male with a history of dementia, GERD, aortic aneurysm, who presents to the emergency department from [facility] for evaluation of increasing altered behavior.  Patient was found walking around the facility new today.  Family reports increasing behavioral disturbances over the past few weeks since recently being started on Lamictal for possible seizure-like activity as well as for mood stabilization.  Patient is followed by Dr. Karel Jarvis with neurology.  Family reports that he has had some increasing verbally aggressive behavior and then today was found walking around in the nude.  Family had not considered that there could be something more acute going on and what facility pose this they wanted to have him evaluated.  Patient currently reports he is not sure why he is here being evaluated today and that he feels fine.  He denies walking around the facility without his close on.  He denies any pain, no recent fevers or cough.  No dysuria or urinary frequency, no abdominal pain, vomiting or diarrhea and he reports he has been eating and drinking as per usual.  Family denies any other changes to medication.  Patient's wife and daughter are at bedside and helped to provide history.  Currently they report patient is at baseline  The history is provided by the patient, a significant other, a relative and medical records.       Home Medications Prior to Admission medications   Medication Sig Start Date End Date Taking? Authorizing Provider  aspirin EC 81 MG tablet Take 1 tablet (81 mg total) by mouth daily. Swallow whole. 08/08/22   Chandrasekhar, Mahesh A, MD  busPIRone (BUSPAR) 5 MG tablet Take 1 tablet (5 mg total) by mouth  2 (two) times daily. 09/02/23   Van Clines, MD  escitalopram (LEXAPRO) 20 MG tablet Take 1 and 1/2 tablets daily Patient taking differently: Take 20 mg by mouth daily. Take 1 tablet daily 06/20/23   Van Clines, MD  famotidine (PEPCID) 20 MG tablet Take 1 tablet (20 mg total) by mouth daily. 12/31/22   Myrlene Broker, MD  ipratropium (ATROVENT) 0.06 % nasal spray INSTILL ONE SPRAY IN [facility] NOSTRIL FOUR TIMES DAILY 11/12/22   Myrlene Broker, MD  lamoTRIgine (LAMICTAL) 25 MG tablet Take 1 tablet twice a day for 2 weeks, then increase to 2 tablets twice a day 11/14/23   Van Clines, MD  loperamide (IMODIUM) 2 MG capsule Take 2 mg by mouth as needed for diarrhea or loose stools.    [provider]  prednisoLONE acetate (PRED FORTE) 1 % ophthalmic suspension Place 1 drop into both eyes every 4 (four) hours. 04/18/23   [provider]  psyllium (METAMUCIL) 58.6 % packet Take 1 packet by mouth daily.    [provider]  traZODone (DESYREL) 50 MG tablet Take 1/2 tablet every night for 1 week, then increase to 1 tablet every night Patient taking differently: Take 50 mg by mouth at bedtime. Take 1 tablet every night 06/30/23   Van Clines, MD      Allergies    Patient has no known allergies.    Review of Systems   Review of Systems  Constitutional:  Negative for chills and fever.  HENT: Negative.    Respiratory:  Negative for cough and shortness of breath.   Cardiovascular:  Negative for chest pain.  Gastrointestinal:  Negative for abdominal pain, diarrhea and vomiting.  Genitourinary:  Negative for dysuria and frequency.  Neurological:  Negative for dizziness, seizures, syncope, weakness and headaches.  Psychiatric/Behavioral:  Positive for behavioral problems.     Physical Exam Updated Vital Signs BP (!) 109/93 (BP Location: Left Arm)   Pulse 72   Temp 98.3 F (36.8 C) (Oral)   Resp 16   SpO2 98%  Physical Exam Vitals and nursing note  reviewed.  Constitutional:      General: He is not in acute distress.    Appearance: Normal appearance. He is well-developed. He is not ill-appearing or diaphoretic.  HENT:     Head: Normocephalic and atraumatic.  Eyes:     General:        Right eye: No discharge.        Left eye: No discharge.     Pupils: Pupils are equal, round, and reactive to light.  Cardiovascular:     Rate and Rhythm: Normal rate and regular rhythm.     Pulses: Normal pulses.     Heart sounds: Normal heart sounds.  Pulmonary:     Effort: Pulmonary effort is normal. No respiratory distress.     Breath sounds: Normal breath sounds. No wheezing or rales.     Comments: Respirations equal and unlabored, patient able to speak in full sentences, lungs clear to auscultation bilaterally  Abdominal:     General: Bowel sounds are normal. There is no distension.     Palpations: Abdomen is soft. There is no mass.     Tenderness: There is no abdominal tenderness. There is no guarding.     Comments: Abdomen soft, nondistended, nontender to palpation in all quadrants without guarding or peritoneal signs  Musculoskeletal:        General: No deformity.     Cervical back: Neck supple.  Skin:    General: Skin is warm and dry.     Capillary Refill: Capillary refill takes less than 2 seconds.  Neurological:     Mental Status: He is alert and oriented to person, place, and time.     Coordination: Coordination normal.     Comments: Speech is clear, able to follow commands CN III-XII intact Normal strength in upper and lower extremities bilaterally including dorsiflexion and plantar flexion, strong and equal grip strength Sensation normal to light and sharp touch Moves extremities without ataxia, coordination intact  Psychiatric:        Mood and Affect: Mood normal.        Speech: Speech normal.        Behavior: Behavior normal. Behavior is cooperative.        Thought Content: Thought content is not paranoid or delusional.         Cognition and Memory: Memory is impaired.     Comments: Pleasantly demented     ED Results / Procedures / Treatments   Labs (all labs ordered are listed, but only abnormal results are displayed) Labs Reviewed  COMPREHENSIVE METABOLIC PANEL - Abnormal; Notable for the following components:      Result Value   Calcium 8.8 (*)    All other components within normal limits  CBC WITH DIFFERENTIAL/PLATELET - Abnormal; Notable for the following components:   WBC 10.6 (*)    Abs Immature Granulocytes 0.10 (*)  All other components within normal limits  ETHANOL  RAPID URINE DRUG SCREEN, HOSP PERFORMED  URINALYSIS, ROUTINE W REFLEX MICROSCOPIC    EKG EKG Interpretation Date/Time:  Thursday December 11 2023 06:11:35 EST Ventricular Rate:  75 PR Interval:    QRS Duration:  96 QT Interval:  404 QTC Calculation: 452 R Axis:   60  Text Interpretation: Normal sinus rhythm Confirmed by Palumbo, April ([id number]) on 12/11/2023 6:56:20 AM  Radiology CT Head Wo Contrast Result Date: 12/11/2023 CLINICAL DATA:  68 year old male with altered mental status. EXAM: CT HEAD WITHOUT CONTRAST TECHNIQUE: Contiguous axial images were obtained from the base of the skull through the vertex without intravenous contrast. RADIATION DOSE REDUCTION: This exam was performed according to the departmental dose-optimization program which includes automated exposure control, adjustment of the mA and/or kV according to patient size and/or use of iterative reconstruction technique. COMPARISON:  Brain MRI 11/10/2023 and earlier. FINDINGS: Brain: Stable cerebral volume. Volume loss seems most pronounced in the parietal lobes. Stable ventriculomegaly. No midline shift, mass effect, or evidence of intracranial mass lesion. No acute intracranial hemorrhage identified. No cortically based acute infarct identified. Vascular: Calcified atherosclerosis at the skull base. No suspicious intracranial vascular hyperdensity. Skull: Mild  motion artifact at the skull base despite repeated imaging attempts. No acute osseous abnormality identified. Sinuses/Orbits: Visualized paranasal sinuses and mastoids are clear. Other: No acute orbit or scalp soft tissue finding. IMPRESSION: Stable cerebral volume loss.  No acute intracranial abnormality. Electronically Signed   By: Odessa Fleming M.D.   On: 12/11/2023 08:54   DG Chest 2 View Result Date: 12/11/2023 CLINICAL DATA:  AMS EXAM: CHEST - 2 VIEW COMPARISON:  CT cardiac 11/27/2022 FINDINGS: The heart and mediastinal contours are within normal limits. No focal consolidation. No definite pulmonary edema. No pleural effusion. No pneumothorax. No acute osseous abnormality. IMPRESSION: No active cardiopulmonary disease. Electronically Signed   By: Tish Frederickson M.D.   On: 12/11/2023 08:40    Procedures Procedures    Medications Ordered in ED Medications - No data to display  ED Course/ Medical Decision Making/ A&P                                 Medical Decision Making Amount and/or Complexity of Data Reviewed Labs: ordered. Radiology: ordered.   68 year old male presents with some recent behavioral changes, patient with history of dementia, recently started on Lamictal about a month ago and since then has had some increasing behavioral disturbance.  He is currently at his baseline with no complaints and family reports their goal is to make sure there is nothing more acute or reversible occurring.  They are already followed by Dr. Karel Jarvis with neurology and have plans for close follow-up.  Laboratory evaluation has been very reassuring, minimal leukocytosis of 10.6, normal hemoglobin, no electrolyte derangements, normal renal and liver function, UA without any signs of infection, UDS and ethanol level negative.  Chest x-ray without signs of pneumonia and CT head with central volume loss, no signs of hydrocephalus or other acute changes.  Patient has remained at baseline without behavioral  disturbances or agitation while here in the ED.  Discussed reassuring workup with wife and daughter at bedside.  They have plans to follow-up to discuss medication changes with Dr. Leanne Chang open in the meantime are comfortable having patient return to [facility].  At this time patient is stable for discharge home.  Final Clinical Impression(s) / ED Diagnoses Final diagnoses:  Behavioral change    Rx / DC Orders ED Discharge Orders     None         Velda Shell 12/17/23 1633    Gwyneth Sprout, MD 12/17/23 2238

## 2023-12-11 NOTE — ED Notes (Signed)
 [facility] called

## 2023-12-11 NOTE — ED Notes (Signed)
 ED Provider at bedside.

## 2023-12-12 ENCOUNTER — Ambulatory Visit (INDEPENDENT_AMBULATORY_CARE_PROVIDER_SITE_OTHER): Payer: BLUE CROSS/BLUE SHIELD

## 2023-12-12 ENCOUNTER — Telehealth: Payer: Self-pay | Admitting: Neurology

## 2023-12-12 NOTE — Telephone Encounter (Signed)
 Cathy with [facility] called, she would like a call back to discuss howards medication.   Lamictal 25mg   Apparently the dosage has been different and the orders were off.   You can speak with cathy or lisa

## 2023-12-12 NOTE — Telephone Encounter (Signed)
 Cathy at [facility] called with other clarification

## 2024-01-13 ENCOUNTER — Ambulatory Visit (INDEPENDENT_AMBULATORY_CARE_PROVIDER_SITE_OTHER): Payer: BLUE CROSS/BLUE SHIELD | Admitting: Otolaryngology

## 2024-01-13 ENCOUNTER — Encounter (INDEPENDENT_AMBULATORY_CARE_PROVIDER_SITE_OTHER): Payer: Self-pay

## 2024-01-13 VITALS — BP 134/83 | HR 69 | Ht 76.0 in | Wt 218.0 lb

## 2024-01-13 DIAGNOSIS — H6123 Impacted cerumen, bilateral: Secondary | ICD-10-CM | POA: Diagnosis not present

## 2024-01-13 NOTE — Progress Notes (Signed)
 Patient ID: Brandon Nielsen, male   DOB: 08-26-1956, 68 y.o.   MRN: [id number]  Follow-up: Recurrent cerumen impaction  Procedure: Bilateral cerumen disimpaction.   Indication: Cerumen impaction, resulting in ear discomfort and conductive hearing loss.   Description: The patient is placed supine on the operating table. Under the operating microscope, the right ear canal is examined and is noted to be impacted with cerumen. The cerumen is carefully removed with a combination of suction catheters, cerumen curette, and alligator forceps. After the cerumen removal, the ear canal and tympanic membrane are noted to be normal. No middle ear effusion is noted. The same procedure is then repeated on the left side without exception. The patient tolerated the procedure well.  Follow-up care:  The patient is instructed not to use Q-tips to clean the ear canals. The patient will follow up in 5 months.

## 2024-01-22 ENCOUNTER — Ambulatory Visit: Payer: BLUE CROSS/BLUE SHIELD | Attending: Internal Medicine | Admitting: Internal Medicine

## 2024-01-22 ENCOUNTER — Encounter: Payer: Self-pay | Admitting: Internal Medicine

## 2024-01-22 VITALS — BP 120/68 | HR 66 | Ht 76.0 in | Wt 219.0 lb

## 2024-01-22 DIAGNOSIS — F0280 Dementia in other diseases classified elsewhere without behavioral disturbance: Secondary | ICD-10-CM | POA: Diagnosis present

## 2024-01-22 DIAGNOSIS — Z8673 Personal history of transient ischemic attack (TIA), and cerebral infarction without residual deficits: Secondary | ICD-10-CM | POA: Insufficient documentation

## 2024-01-22 DIAGNOSIS — G300 Alzheimer's disease with early onset: Secondary | ICD-10-CM | POA: Diagnosis not present

## 2024-01-22 DIAGNOSIS — I480 Paroxysmal atrial fibrillation: Secondary | ICD-10-CM | POA: Insufficient documentation

## 2024-01-22 NOTE — Patient Instructions (Signed)
 Medication Instructions:  Your physician recommends that you continue on your current medications as directed. Please refer to the Current Medication list given to you today.  *If you need a refill on your cardiac medications before your next appointment, please call your pharmacy*  Follow-Up: At [facility], you and your health needs are our priority.  As part of our continuing mission to provide you with exceptional heart care, we have created designated Provider Care Teams.  These Care Teams include your primary Cardiologist (physician) and Advanced Practice Providers (APPs -  Physician Assistants and Nurse Practitioners) who all work together to provide you with the care you need, when you need it.  Your next appointment:   As needed  The format for your next appointment:   In Person  Provider:   Christell Constant, MD {  Other Instructions   1st Floor: - Lobby - Registration  - Pharmacy  - Lab - Cafe  2nd Floor: - PV Lab - Diagnostic Testing (echo, CT, nuclear med)  3rd Floor: - Vacant  4th Floor: - TCTS (cardiothoracic surgery) - AFib Clinic - Structural Heart Clinic - Vascular Surgery  - Vascular Ultrasound  5th Floor: - HeartCare Cardiology (general and EP) - Clinical Pharmacy for coumadin, hypertension, lipid, weight-loss medications, and med management appointments    Valet parking services will be available as well.

## 2024-01-22 NOTE — Progress Notes (Signed)
 Cardiology Office Note:    Date:  01/22/2024   ID:  Brandon, Nielsen July 26, 1956, MRN [id number]  PCP:  Myrlene Broker, MD   [facility] Providers Cardiologist:  Christell Constant, MD     Referring MD: Myrlene Broker, *   CC: f/u atrial fibrillation  History of Present Illness:    Brandon Nielsen is a 68 y.o. male with a hx of new paroxsymal atrial fibrillation, OSA, and Alzhemiers' dementia, prior ILR, who presents for evaluation.Prior heart monitoring in 2014 through [facility]. 2023: Establish care; still working as a lawyer  Brandon Nielsen is an 68 year old male with atrial fibrillation and a history of stroke who presents for cardiovascular follow-up.  He has a history of atrial fibrillation and a previous stroke. Currently, he is not on anticoagulation therapy due to the associated risks and benefits. He feels 'pretty well' and denies any recent pain or breathing issues. He is on a low-dose aspirin regimen without any complications.  He has a thoracic aortic aneurysm with mild aortic dilation measuring 44 to 45 millimeters. There have been no recent changes in his condition that would necessitate surgical intervention or further invasive testing at this time.  There is significant progression of dementia, impacting his daily life. Despite this, he maintains regular physical activity, performing at least eight or nine laps at the track daily, although his gait has slowed significantly.  He engages in regular physical activity, which is beneficial for his overall health. His vitamin D levels and red blood cell counts are within normal limits, and there are no surprises in his current medication list.    Past Medical History:  Diagnosis Date   Aneurysm of the ascending aorta, without rupture (HCC)    Aortic root dilation (HCC)    Chicken pox    as a child   Dementia (HCC)    Fuch's endothelial dystrophy    GERD (gastroesophageal reflux  disease)    off meds - after loosing weight   H/O infectious mononucleosis    in college   Hepatitis     Past Surgical History:  Procedure Laterality Date   COLONOSCOPY     CORNEAL TRANSPLANT  2014   Bil eyes   NASAL SEPTUM SURGERY     at 61 yrs of age    Current Medications: Current Meds  Medication Sig   aspirin EC 81 MG tablet Take 1 tablet (81 mg total) by mouth daily. Swallow whole.   busPIRone (BUSPAR) 5 MG tablet Take 1 tablet (5 mg total) by mouth 2 (two) times daily.   escitalopram (LEXAPRO) 20 MG tablet Take 1 and 1/2 tablets daily (Patient taking differently: Take 20 mg by mouth daily. Take 1 tablet daily)   famotidine (PEPCID) 20 MG tablet Take 1 tablet (20 mg total) by mouth daily.   ipratropium (ATROVENT) 0.06 % nasal spray INSTILL ONE SPRAY IN EACH NOSTRIL FOUR TIMES DAILY   lamoTRIgine (LAMICTAL) 25 MG tablet Take 1 tablet twice a day for 2 weeks, then increase to 2 tablets twice a day   loperamide (IMODIUM) 2 MG capsule Take 2 mg by mouth as needed for diarrhea or loose stools.   prednisoLONE acetate (PRED FORTE) 1 % ophthalmic suspension Place 1 drop into both eyes every 4 (four) hours.   psyllium (METAMUCIL) 58.6 % packet Take 1 packet by mouth daily.   traZODone (DESYREL) 50 MG tablet Take 1/2 tablet every night for 1 week, then increase to 1  tablet every night (Patient taking differently: Take 50 mg by mouth at bedtime. Take 1 tablet every night)     Allergies:   Patient has no known allergies.   Social History   Socioeconomic History   Marital status: Married    Spouse name: Not on file   Number of children: 3   Years of education: 63   Highest education level: Not on file  Occupational History   Occupation: lawyer    Employer: [employer]  Tobacco Use   Smoking status: Never   Smokeless tobacco: Never  Vaping Use   Vaping status: Never Used  Substance and Sexual Activity   Alcohol use: Yes    Alcohol/week: 8.0 - 10.0 standard drinks of  alcohol    Types: 8 - 10 Standard drinks or equivalent per week    Comment: occ   Drug use: No   Sexual activity: Yes    Partners: Female  Other Topics Concern   Not on file  Social History Narrative   HSG, 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 Married '77- 13/divorced. Married '95.  3 adult children - '82, '85, '86 (step daughter). Work - TEFL teacher.    Left handed    One story   Hot tea and coffee             Social Drivers of Health   Financial Resource Strain: Low Risk  (03/17/2023)   Overall Financial Resource Strain (CARDIA)    Difficulty of Paying Living Expenses: Not hard at all  Food Insecurity: No Food Insecurity (03/17/2023)   Hunger Vital Sign    Worried About Running Out of Food in the Last Year: Never true    Ran Out of Food in the Last Year: Never true  Transportation Needs: No Transportation Needs (03/17/2023)   PRAPARE - Administrator, Civil Service (Medical): No    Lack of Transportation (Non-Medical): No  Physical Activity: Sufficiently Active (03/17/2023)   Exercise Vital Sign    Days of Exercise per Week: 3 days    Minutes of Exercise per Session: 60 min  Stress: No Stress Concern Present (03/17/2023)   [employer] of Occupational Health - Occupational Stress Questionnaire    Feeling of Stress : Not at all  Social Connections: Moderately Integrated (03/17/2023)   Social Connection and Isolation Panel [NHANES]    Frequency of Communication with Friends and Family: More than three times a week    Frequency of Social Gatherings with Friends and Family: More than three times a week    Attends Religious Services: Never    Database administrator or Organizations: Yes    Attends Engineer, structural: More than 4 times per year    Marital Status: Married    Social: comes with wife [name], has a dog and grandchildren, son was a former Psychologist, clinical, now at [employer] History: The patient's  family history includes Cancer in his mother; Colon polyps in his mother; Heart disease in his father; Hypertension in his father and mother. Afib in sister and partents.   ROS:   Please see the history of present illness.     EKGs/Labs/Other Studies Reviewed:    The following studies were reviewed today:  Recent Labs: 12/11/2023: ALT 31; BUN 11; Creatinine, Ser 1.06; Hemoglobin 14.1; Platelets 252; Potassium 3.8; Sodium 137  Recent Lipid Panel    Component Value Date/Time   CHOL 180 11/14/2023 1235  TRIG 190 (H) 11/14/2023 1235   HDL 61 11/14/2023 1235   CHOLHDL 3.0 11/14/2023 1235   VLDL 23.2 07/10/2022 1113   LDLCALC 91 11/14/2023 1235   Cardiac Studies & Procedures   ______________________________________________________________________________________________     ECHOCARDIOGRAM  ECHOCARDIOGRAM COMPLETE 05/30/2023  Narrative ECHOCARDIOGRAM REPORT    Patient Name:   Brandon Nielsen Date of Exam: 05/30/2023 Medical Rec #:  [id number]          Height:       73.0 in Accession #:    [id number]         Weight:       226.2 lb Date of Birth:  09/04/1956         BSA:          2.267 m Patient Age:    66 years           BP:           109/77 mmHg Patient Gender: M                  HR:           59 bpm. Exam Location:  [address]  Procedure: 2D Echo, Cardiac Doppler and Color Doppler  Indications:    I77.810 Dilated aortic root  History:        Patient has prior history of Echocardiogram examinations, most recent 08/27/2022. LVH, Dilated aortic root; Arrythmias:Atrial Fibrillation.  Sonographer:    Samule Ohm RDCS Referring Phys: [id number] [place] A Geral Coker  IMPRESSIONS   1. Left ventricular ejection fraction, by estimation, is 60 to 65%. The left ventricle has normal function. The left ventricle has no regional wall motion abnormalities. There is moderate concentric left ventricular hypertrophy. Left ventricular diastolic function could not be  evaluated. 2. Right ventricular systolic function is normal. The right ventricular size is normal. 3. Left atrial size was severely dilated. 4. Right atrial size was mildly dilated. 5. The mitral valve is normal in structure. Mild mitral valve regurgitation. No evidence of mitral stenosis. 6. The aortic valve is normal in structure. Aortic valve regurgitation is trivial. No aortic stenosis is present. 7. Aortic dilatation noted. There is mild dilatation of the aortic arch, measuring 41 mm. There is mild dilatation of the ascending aorta, measuring 43 mm. 8. The inferior vena cava is normal in size with greater than 50% respiratory variability, suggesting right atrial pressure of 3 mmHg.  FINDINGS Left Ventricle: Left ventricular ejection fraction, by estimation, is 60 to 65%. The left ventricle has normal function. The left ventricle has no regional wall motion abnormalities. The left ventricular internal cavity size was normal in size. There is moderate concentric left ventricular hypertrophy. Left ventricular diastolic function could not be evaluated due to atrial fibrillation. Left ventricular diastolic function could not be evaluated.  Right Ventricle: The right ventricular size is normal. No increase in right ventricular wall thickness. Right ventricular systolic function is normal.  Left Atrium: Left atrial size was severely dilated.  Right Atrium: Right atrial size was mildly dilated.  Pericardium: There is no evidence of pericardial effusion.  Mitral Valve: The mitral valve is normal in structure. Mild mitral valve regurgitation. No evidence of mitral valve stenosis.  Tricuspid Valve: The tricuspid valve is normal in structure. Tricuspid valve regurgitation is not demonstrated. No evidence of tricuspid stenosis.  Aortic Valve: The aortic valve is normal in structure. Aortic valve regurgitation is trivial. No aortic stenosis is present.  Pulmonic Valve: The pulmonic valve was  normal  in structure. Pulmonic valve regurgitation is trivial. No evidence of pulmonic stenosis.  Aorta: Aortic dilatation noted. There is mild dilatation of the aortic arch, measuring 41 mm. There is mild dilatation of the ascending aorta, measuring 43 mm.  Venous: The inferior vena cava is normal in size with greater than 50% respiratory variability, suggesting right atrial pressure of 3 mmHg.  IAS/Shunts: No atrial level shunt detected by color flow Doppler.   LEFT VENTRICLE PLAX 2D LVIDd:         4.70 cm   Diastology LVIDs:         3.20 cm   LV e' medial:    10.60 cm/s LV PW:         1.50 cm   LV E/e' medial:  8.6 LV IVS:        1.50 cm   LV e' lateral:   16.27 cm/s LVOT diam:     2.40 cm   LV E/e' lateral: 5.6 LV SV:         90 LV SV Index:   40 LVOT Area:     4.52 cm   IVC IVC diam: 1.30 cm  LEFT ATRIUM              Index        RIGHT ATRIUM           Index LA diam:        4.80 cm  2.12 cm/m   RA Pressure: 3.00 mmHg LA Vol (A2C):   98.3 ml  43.36 ml/m  RA Area:     18.10 cm LA Vol (A4C):   90.9 ml  40.09 ml/m  RA Volume:   49.80 ml  21.97 ml/m LA Biplane Vol: 100.0 ml 44.11 ml/m AORTIC VALVE LVOT Vmax:   93.80 cm/s LVOT Vmean:  64.520 cm/s LVOT VTI:    0.199 m  AORTA Ao Root diam: 4.10 cm Ao Asc diam:  4.25 cm  MITRAL VALVE               TRICUSPID VALVE MV Area (PHT): 5.06 cm    Estimated RAP:  3.00 mmHg MV Decel Time: 150 msec MV E velocity: 91.60 cm/s  SHUNTS Systemic VTI:  0.20 m Systemic Diam: 2.40 cm  Arvilla Meres MD Electronically signed by Arvilla Meres MD Signature Date/Time: 05/30/2023/1:16:07 PM    Final      CT SCANS  CT CARDIAC SCORING (SELF PAY ONLY) 11/27/2022  Addendum 12/01/2022 11:51 AM ADDENDUM REPORT: 12/01/2022 11:49  EXAM: OVER-READ INTERPRETATION  CT CHEST  The following report is an over-read performed by radiologist Dr. Royal Piedra [facility] on 12/01/2022. This over-read does not include  interpretation of cardiac or coronary anatomy or pathology. The coronary calcium score interpretation by the cardiologist is attached.  COMPARISON:  None.  FINDINGS: Ectasia of ascending thoracic aorta (4.3 cm in diameter). Within the visualized portions of the thorax there are no suspicious appearing pulmonary nodules or masses, there is no acute consolidative airspace disease, no pleural effusions, no pneumothorax and no lymphadenopathy. Visualized portions of the upper abdomen are unremarkable. There are no aggressive appearing lytic or blastic lesions noted in the visualized portions of the skeleton.  IMPRESSION: 1. Ectasia of ascending thoracic aorta (4.3 cm in diameter). Recommend annual imaging followup by CTA or MRA. This recommendation follows 2010 ACCF/AHA/AATS/ACR/ASA/SCA/SCAI/SIR/STS/SVM Guidelines for the Diagnosis and Management of Patients with Thoracic Aortic Disease. Circulation. 2010; 121: J191-Y782. Aortic aneurysm NOS (ICD10-I71.9).   Electronically  Signed By: Trudie Reed M.D. On: 12/01/2022 11:49  Narrative CLINICAL DATA:  Risk stratification  EXAM: Coronary Calcium Score  TECHNIQUE: The patient was scanned on a Siemens Somatom 64 slice scanner. Axial non-contrast 3mm slices were carried out through the heart. The data set was analyzed on a dedicated work station and scored using the Agatson method.  FINDINGS: Non-cardiac: No significant non cardiac findings on limited lung and soft tissue windows. See separate report from [facility].  Ascending Aorta: Dilated with diameter of 4.1 cm  Pericardium: Normal  Coronary arteries: No calcium noted  IMPRESSION: Coronary calcium score of 0.  Charlton Haws  Electronically Signed: By: Charlton Haws M.D. On: 11/27/2022 16:35   CARDIAC MRI  MR CARDIAC MORPHOLOGY W WO CONTRAST 05/01/2023  Narrative CLINICAL DATA:  Clinical question of aortic dilation Study assumes BSA of 2.30  m2.  EXAM: CARDIAC MRI  TECHNIQUE: The patient was scanned on a 1.5 Tesla GE magnet. A dedicated cardiac coil was used. Functional imaging was done using Fiesta sequences. 2,3, and 4 chamber views were done to assess for RWMA's. Modified Simpson's rule using a short axis stack was used to calculate an ejection fraction on a dedicated work Research officer, trade union. The patient received 10 cc of Gadavist. After 10 minutes inversion recovery sequences were used to assess for infiltration and scar tissue. Flow quantification was performed 2 times during this examination with flow quantification performed at the levels of the ascending aorta above the valve, pulmonary artery above the valve. MRA sequence performed.  CONTRAST:  10 cc  of Gadavist  FINDINGS: 1. Normal left ventricular size, with LVEDD 55 mm, and LVEDVi 81 mL/m2.  Moderate septal hypertrophy, 13 mm, with myocardial mass index of 69 g/m2.  Normal left ventricular systolic function (LVEF =57%). There are no regional wall motion abnormalities.  Left ventricular parametric mapping notable for normal T2 signal. Normal ECV in images obtained- suboptimal basal inferior acquisition.  There is no late gadolinium enhancement in the left ventricular myocardium.  2. Dilated right ventricular size with RVEDD 52 mm but RVEDVI 86 mL/m2 (normal).  Normal right ventricular thickness.  Low normal right ventricular systolic function (RVEF =47%). There are no regional wall motion abnormalities or aneurysms. Visually free wall function was normal.  3.  Moderate right atrial dilation.  Severe left atrial dilation.  4. Normal size of the aortic root, and mildly dilated pulmonary artery. Normal three vessel anatomy. No evidence of coarctation of the aorta or dissection.  Sinus of Valsalva: 37 mm largest sinus to sinus diameter.  Sinotubular junction: 27 mm  Ascending aorta: 44 mm (Mildly dilated)  Aortic Arch: 31  mm  Descending Aorta: 29 mm  Abdominal aorta: 25 mm  Pulmonary Artery: 29 mm  5. Valve assessment:  Aortic Valve: Tri-leaflet valve. No significant aortic regurgitation.  Pulmonic Valve: No significant regurgitation.  Tricuspid Valve: Difficult quantification in the setting of free breathing. Qualitatively mild regurgitation.  Mitral Valve: Mild regurgitation- regurgitant fraction 9%.  6.  Normal pericardium.  No pericardial effusion.  7. Grossly, no extracardiac findings. Recommended dedicated study if concerned for non-cardiac pathology.  8.  Free breathing artifact noted.  This may affect RV assessment.  IMPRESSION: In the absence of family history, diagnostic criteria for hypertrophic cardiomyopathy is not met.  Evidence of mild ascending aortic dilation, 44 mm. Minimal change from non CMR imaging. Consider secondary imaging modality (echocardiogram, CTA Aorta Protocol, MRA Aorta Protocol) in 12 months if clinically indicated.  Jonas Goh  Izora Ribas MD   Electronically Signed By: Riley Lam M.D. On: 05/01/2023 21:24   ______________________________________________________________________________________________      Physical Exam:    VS:  BP 120/68 (BP Location: Left Arm)   Pulse 66   Ht 6\' 4"  (1.93 m)   Wt 99.3 kg   SpO2 95%   BMI 26.66 kg/m     Wt Readings from Last 3 Encounters:  01/22/24 99.3 kg  01/13/24 98.9 kg  11/14/23 99.1 kg    GEN:  Well nourished, well developed in no acute distress HEENT: Normal NECK: No JVD CARDIAC: IRIR, no murmurs, rubs, gallops RESPIRATORY:  Clear to auscultation without rales, wheezing or rhonchi  ABDOMEN: Soft, non-tender, non-distended MUSCULOSKELETAL:  No edema; No deformity  SKIN: Warm and dry NEUROLOGIC:  Alert and oriented  person, vaguely to situation (knows I am a cardiologist) PSYCHIATRIC:  Normal affect   ASSESSMENT:    1. PAF (paroxysmal atrial fibrillation) (HCC)   2. Early onset  Alzheimer's dementia without behavioral disturbance (HCC)   3. History of stroke     PLAN:   Atrial Fibrillation He has atrial fibrillation and is not on anticoagulation therapy due to significant fall risk, which outweighs the benefits of reducing embolic stroke risk. Low-dose aspirin is deemed reasonable given the circumstances. Full anticoagulation or left atrial appendage occlusion is not pursued due to associated risks and his current condition. - Continue low-dose aspirin therapy  Stroke His stroke history influences atrial fibrillation management. Low-dose aspirin is considered reasonable given the balance of risks and benefits. - Continue low-dose aspirin (this does not fully decrease his risk but cannot tolerate AC given progression of dementia  Thoracic Aortic Aneurysm He has a mild thoracic aortic aneurysm with dilation of 44-45 mm. Given his age and condition, surgical intervention is not planned, and further invasive testing is avoided as it would not alter management. - No further imaging or surgical intervention planned given dementia  Dementia Significant progression of dementia impacts management and decision-making, focusing on quality of life and safety. - Focus on supportive care and safety measures  Goals of Care The primary goal is to maintain quality of life, balancing risks and benefits of interventions with a focus on non-invasive management. Family involvement prioritizes his comfort and safety. - Provide supportive care  Medication Adjustments/Labs and Tests Ordered: Current medicines are reviewed at length with the patient today.  Concerns regarding medicines are outlined above.  No orders of the defined types were placed in this encounter.  No orders of the defined types were placed in this encounter.   Patient Instructions  Medication Instructions:  Your physician recommends that you continue on your current medications as directed. Please refer to the  Current Medication list given to you today.  *If you need a refill on your cardiac medications before your next appointment, please call your pharmacy*  Follow-Up: At [facility], you and your health needs are our priority.  As part of our continuing mission to provide you with exceptional heart care, we have created designated Provider Care Teams.  These Care Teams include your primary Cardiologist (physician) and Advanced Practice Providers (APPs -  Physician Assistants and Nurse Practitioners) who all work together to provide you with the care you need, when you need it.  Your next appointment:   As needed  The format for your next appointment:   In Person  Provider:   Christell Constant, MD {  Other Instructions   1st Floor: - Lobby -  Registration  - Pharmacy  - Lab - Cafe  2nd Floor: - PV Lab - Diagnostic Testing (echo, CT, nuclear med)  3rd Floor: - Vacant  4th Floor: - TCTS (cardiothoracic surgery) - AFib Clinic - Structural Heart Clinic - Vascular Surgery  - Vascular Ultrasound  5th Floor: - HeartCare Cardiology (general and EP) - Clinical Pharmacy for coumadin, hypertension, lipid, weight-loss medications, and med management appointments    Valet parking services will be available as well.     Signed, Christell Constant, MD  01/22/2024 10:53 AM    [facility]

## 2024-02-25 ENCOUNTER — Ambulatory Visit: Payer: BLUE CROSS/BLUE SHIELD | Admitting: Neurology

## 2024-03-02 ENCOUNTER — Encounter: Payer: Self-pay | Admitting: Neurology

## 2024-03-02 ENCOUNTER — Ambulatory Visit (INDEPENDENT_AMBULATORY_CARE_PROVIDER_SITE_OTHER): Payer: BLUE CROSS/BLUE SHIELD | Admitting: Neurology

## 2024-03-02 VITALS — BP 123/83 | HR 67 | Ht 76.0 in | Wt 219.6 lb

## 2024-03-02 DIAGNOSIS — F02818 Dementia in other diseases classified elsewhere, unspecified severity, with other behavioral disturbance: Secondary | ICD-10-CM | POA: Diagnosis not present

## 2024-03-02 DIAGNOSIS — G300 Alzheimer's disease with early onset: Secondary | ICD-10-CM | POA: Diagnosis not present

## 2024-03-02 DIAGNOSIS — R569 Unspecified convulsions: Secondary | ICD-10-CM | POA: Diagnosis not present

## 2024-03-02 NOTE — Patient Instructions (Addendum)
 It's always a pleasure to see you. Continue all your medications. Continue daily activities at [facility]. Please call our office for any change in symptoms. Follow-up in 6 months

## 2024-03-02 NOTE — Progress Notes (Signed)
 NEUROLOGY FOLLOW UP OFFICE NOTE  Canton Yearby [id number] 70-01-1956  HISTORY OF PRESENT ILLNESS: I had the pleasure of seeing Brandon Nielsen in follow-up in the neurology clinic on 03/02/2024.  The patient was last seen 4 months ago for early onset Alzheimer's dementia and new onset seizure. He is again accompanied by his wife Haskell Linker and caregiver Ray who help supplement the history today.  Records and images were personally reviewed where available.  He has had 2 syncopal episodes suggestive of vasovagal syncope, then an episode on 09/25/23 with focal symptoms of right hand ?dystonia and jerking associated with decreased responsiveness. EEG normal. MRI brain no acute changes, there is note of progression of atrophy and microvascular disease, asymmetric (more on the right posterior parietal region), chronic infarct could not be excluded. He was started on Lamotrigine  on last visit for seizure prophylaxis, however this caused significant behavioral changes. He became very agitated, harder to handle. He was in the ER in 11/2023 for increased verbal aggression, walking around nude. Lamotrigine  was stopped with improvement of behaviors. No seizures since 09/2023. He is pleasant today, asking his wife "how would you explain my mood?" They say he is in a fairly good mood. He has good and bad days. He is quite often very jovial, sometimes there is a little separation anxiety. He sleeps well. If he wakes up from a nap, he is confused where things are and where he is. He used to call family at night confused, they rarely get these calls. His wife notes steady progression of dementia. He is having more visuospatial difficulties, he is better following than leading. He needs more finger foods, using utensils is harder. He uses his right hand to wash, there is left-sided neglect. Ray does the shaving. He needs extra supervision showering. He likes walking on the track but gait has slowed. He likes to sing in the  chapel.    History on Initial Assessment 03/02/2021: This is a pleasant 68 year old left-handed man with a history of GERD, presenting to establish care for early onset Alzheimer's disease. His wife Haskell Linker is present to provide additional information. She started noticing confusion around 7-8 years ago, worse in the past year. She reports he has changed so much in his ability to function. She took over finances 2-3 years ago because he was having a harder time. She helps with medications and he takes them by himself, rarely making a mistake. He stopped driving a year ago. He is able to bathe independently but would put on clothes inside out. He underwent Neuropsychological testing with Dr. Annette Barters in March 2021 with note of profound and striking visuospatial impairment including extremely low scores on measures of perceptual functioning and marked constructional apraxia. He also had difficulties with executive functioning. Speed of processing could not be assessed related to extreme difficulty interacting with test forms visually. It was also noted that memory was relatively spared. 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  I personally reviewed brain MRI without contrast done in 12/2019 and 12/2019 did not show interval change, there was moderate diffuse volume loss, slightly more pronounced in the bilateral parietal lobes, right greater than left. It was noted that atrophy pattern is not classic for PCA. He was evaluated by [facility] dementia specialist Dr. Annice Kim in 01/2020 and it was felt that most likely underlying pathological process is Alzheimer's versus frontotemporal lobar degeneration. He  was started on Donepezil  which caused nausea and diarrhea, switched to Rivastigmine. This caused worse vomiting, weakness, so he resumed Donepezil  10mg  daily. He was last seen at [facility] in  09/2020. They have decided to stay locally for now and wanted to establish local care.  His wife reports that cognitive issues are not noticeable with brief conversations. He is more confused at night. They have an aide coming once a week. His son has noticed he has not idea what time of the day it is without cues. She drives him to the office where his sister also works, he sits at his desk and does not do anything. 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 He says he works a 5-day work week. He notes mild depression but he is able to function and does not feel the need to take medication. His wife however states that he is pretty depressed and gets emotional. She shows a video where he has difficulty following instructions, trying to eat with his knife. He starts crying saying he is tired of pushing himself. Sleep is pretty good, no wandering behaviors. No paranoia or hallucinations.   Update 09/26/2023: He states "I just sort of fell out," he recalls being picked up but does not remember what he was doing. Ray reports that he was at breakfast and could not stand up. He then became disoriented and tried to pick up an imaginary cup, taking a drink. He shook a little then his right hand went into a fist and he passed out. He had a grayish color and right arm was jerking for around 2 minutes. As he started waking up, breathing was labored as he was bent over. No tongue bite or incontinence. He had another incident 6 months ago while showering, he was standing and slumped back and slid down, out briefly. Another time he had a bowel movement, then after he got up and started walking, he became shaky and his aide let him to the bed where he briefly passed out. He has had numerous episodes of disorientation/confusion that have increased significantly the past few months. Some days he would be sharp as a tack,  other times he is unable to follow instructions. Ray reports "there is an opposition." The agitation has improved, he is also sleeping much better. He had been started on Trazodone . He has been in a pretty good month the past month, he is on Lexapro  30mg  daily. They are unsure if he is on the Buspar , MAR has been requested for review.   Laboratory Data: Lab Results  Component Value Date   TSH 0.83 11/17/2019   Lab Results  Component Value Date   VITAMINB12 228 11/17/2019   Repeat MRI brain without contrast 12/2020 showed moderate diffuse parenchymal volume loss, slightly more pronounced in the bilateral parietal lobes, right greater than left, more pronounced than expected for age, stable when compared to prior MRI in 2021.  Repeat MRI brain with and without contrast 10/2023 no acute changes, there is age advanced diffuse volume loss, progressed from last MRI, progression of scattered and confluent T2 signal changes.   1-hour EEG in 09/2023 normal   PAST MEDICAL HISTORY: Past Medical History:  Diagnosis Date   Aneurysm of the ascending aorta, without rupture (HCC)    Aortic root dilation (HCC)    Chicken pox    as  a child   Dementia (HCC)    Fuch's endothelial dystrophy    GERD (gastroesophageal reflux disease)    off meds - after loosing weight   H/O infectious mononucleosis    in college   Hepatitis     MEDICATIONS: Current Outpatient Medications on File Prior to Visit  Medication Sig Dispense Refill   acetaminophen  (TYLENOL ) 325 MG tablet Take 650 mg by mouth every 6 (six) hours as needed.     aspirin  EC 81 MG tablet Take 1 tablet (81 mg total) by mouth daily. Swallow whole. 30 tablet 12   busPIRone  (BUSPAR ) 15 MG tablet Take 15 mg by mouth 2 (two) times daily.     escitalopram  (LEXAPRO ) 20 MG tablet Take 1 and 1/2 tablets daily (Patient taking differently: Take 20 mg by mouth daily. Take 1 tablet daily) 135 tablet 3   famotidine  (PEPCID ) 20 MG tablet Take 1 tablet (20 mg  total) by mouth daily. 90 tablet 3   ipratropium (ATROVENT ) 0.06 % nasal spray INSTILL ONE SPRAY IN EACH NOSTRIL FOUR TIMES DAILY 15 mL 1   loperamide (IMODIUM) 2 MG capsule Take 2 mg by mouth as needed for diarrhea or loose stools.     LORazepam (ATIVAN) 0.5 MG tablet Take 0.5 mg by mouth every 8 (eight) hours as needed.     LORazepam (ATIVAN) 1 MG tablet Take 1 mg by mouth daily as needed.     polyethylene glycol (MIRALAX / GLYCOLAX) 17 g packet Take 17 g by mouth daily.     prednisoLONE acetate (PRED FORTE) 1 % ophthalmic suspension Place 1 drop into both eyes every 4 (four) hours.     psyllium (METAMUCIL) 58.6 % packet Take 1 packet by mouth daily.     traZODone  (DESYREL ) 50 MG tablet Take 1/2 tablet every night for 1 week, then increase to 1 tablet every night (Patient taking differently: Take 50 mg by mouth at bedtime. Take 1 tablet every night) 30 tablet 5   No current facility-administered medications on file prior to visit.    ALLERGIES: No Known Allergies  FAMILY HISTORY: Family History  Problem Relation Age of Onset   Cancer Mother        cervical cancer/non-hodgikins lymphoma   Hypertension Mother    Colon polyps Mother    Heart disease Father    Hypertension Father     SOCIAL HISTORY: Social History   Socioeconomic History   Marital status: Married    Spouse name: Not on file   Number of children: 3   Years of education: 34   Highest education level: Not on file  Occupational History   Occupation: Scientist, research (medical): [employer]  Tobacco Use   Smoking status: Never   Smokeless tobacco: Never  Vaping Use   Vaping status: Never Used  Substance and Sexual Activity   Alcohol use: Yes    Alcohol/week: 8.0 - 10.0 standard drinks of alcohol    Types: 8 - 10 Standard drinks or equivalent per week    Comment: occ   Drug use: No   Sexual activity: Yes    Partners: Female  Other Topics Concern   Not on file  Social History Narrative   HS[employer], [place]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 Married '77- 13/divorced. Married '95.  3 adult children - '82, '85, '86 (step daughter). Work - TEFL teacher.    Left handed    One story   Hot tea  and coffee             Social Drivers of Corporate investment banker Strain: Low Risk  (03/17/2023)   Overall Financial Resource Strain (CARDIA)    Difficulty of Paying Living Expenses: Not hard at all  Food Insecurity: No Food Insecurity (03/17/2023)   Hunger Vital Sign    Worried About Running Out of Food in the Last Year: Never true    Ran Out of Food in the Last Year: Never true  Transportation Needs: No Transportation Needs (03/17/2023)   PRAPARE - Administrator, Civil Service (Medical): No    Lack of Transportation (Non-Medical): No  Physical Activity: Sufficiently Active (03/17/2023)   Exercise Vital Sign    Days of Exercise per Week: 3 days    Minutes of Exercise per Session: 60 min  Stress: No Stress Concern Present (03/17/2023)   [employer] of Occupational Health - Occupational Stress Questionnaire    Feeling of Stress : Not at all  Social Connections: Moderately Integrated (03/17/2023)   Social Connection and Isolation Panel [NHANES]    Frequency of Communication with Friends and Family: More than three times a week    Frequency of Social Gatherings with Friends and Family: More than three times a week    Attends Religious Services: Never    Database administrator or Organizations: Yes    Attends Engineer, structural: More than 4 times per year    Marital Status: Married  Catering manager Violence: Not At Risk (03/17/2023)   Humiliation, Afraid, Rape, and Kick questionnaire    Fear of Current or Ex-Partner: No    Emotionally Abused: No    Physically Abused: No    Sexually Abused: No     PHYSICAL EXAM: Vitals:   03/02/24 1257  BP: 123/83  Pulse: 67  SpO2: 97%   General: No acute distress Head:   Normocephalic/atraumatic Skin/Extremities: No rash, no edema Neurological Exam: alert and awake. No aphasia or dysarthria. Fund of knowledge is reduced.  Attention and concentration are reduced. Cranial nerves: Pupils equal, round. Extraocular movements intact with no nystagmus. He again has left-sided neglect with difficulty counting fingers or reaching for objects. No facial asymmetry.  Motor: Bulk and tone normal, muscle strength 5/5 throughout with no pronator drift.   Finger to nose testing intact (but difficulty with target).  Gait narrow-based and steady, no ataxia. No tremors.    IMPRESSION: This is a pleasant 68 yo LH man with a history of GERD, with early onset Alzheimer's disease, possibly posterior cortical atrophy. He has had 2 syncopal episodes suggestive of vasovagal syncope, then an episode on 09/25/23 with focal symptoms of right hand ?dystonia and jerking associated with decreased responsiveness. EEG normal. MRI brain no acute changes, there is note of progression of atrophy and microvascular disease, asymmetric (more on the right posterior parietal region), chronic infarct could not be excluded. He had side effects on Lamotrigine . No further episodes since 09/2023, we agreed to hold off on starting seizure medication for now. If symptoms recur, would consider Depakote. Continue daily activities at [facility]. Continue all medications and close supervision. Follow-up in 6 months, call for any changes   Thank you for allowing me to participate in his care.  Please do not hesitate to call for any questions or concerns.   Rayfield Cairo, M.D.   CC: Dr. Nicolette Barrio

## 2024-03-22 ENCOUNTER — Encounter: Payer: Self-pay | Admitting: Family Medicine

## 2024-03-25 ENCOUNTER — Other Ambulatory Visit: Payer: Self-pay

## 2024-03-25 ENCOUNTER — Encounter: Payer: Self-pay | Admitting: Family Medicine

## 2024-03-25 ENCOUNTER — Other Ambulatory Visit: Payer: Self-pay | Admitting: Family Medicine

## 2024-03-25 DIAGNOSIS — R2681 Unsteadiness on feet: Secondary | ICD-10-CM

## 2024-03-25 DIAGNOSIS — R42 Dizziness and giddiness: Secondary | ICD-10-CM

## 2024-03-30 ENCOUNTER — Ambulatory Visit (INDEPENDENT_AMBULATORY_CARE_PROVIDER_SITE_OTHER)

## 2024-03-30 VITALS — Ht 75.5 in | Wt 219.0 lb

## 2024-03-30 DIAGNOSIS — Z0000 Encounter for general adult medical examination without abnormal findings: Secondary | ICD-10-CM | POA: Diagnosis not present

## 2024-03-30 NOTE — Patient Instructions (Signed)
 Mr. Brandon Nielsen , Thank you for taking time out of your busy schedule to complete your Annual Wellness Visit with me. I enjoyed our conversation and look forward to speaking with you again next year. I, as well as your care team,  appreciate your ongoing commitment to your health goals. Please review the following plan we discussed and let me know if I can assist you in the future. Your Game plan/ To Do List    Follow up Visits: Next Medicare AWV with our clinical staff: 04/05/2025   Have you seen your provider in the last 6 months (3 months if uncontrolled diabetes)? Yes Next Office Visit with your provider: to be scheduled by Spouse  Clinician Recommendations:  Aim for 30 minutes of exercise or brisk walking, 6-8 glasses of water, and 5 servings of fruits and vegetables each day. Educated and advised on getting the Tdap (Tetenus) vaccine in 2025.       This is a list of the screening recommended for you and due dates:  Health Maintenance  Topic Date Due   DTaP/Tdap/Td vaccine (2 - Td or Tdap) 11/05/2022   COVID-19 Vaccine (3 - 2024-25 season) 06/15/2023   Flu Shot  05/14/2024   Colon Cancer Screening  08/25/2024   Medicare Annual Wellness Visit  03/30/2025   Pneumococcal Vaccine for age over 62  Completed   Hepatitis C Screening  Completed   Zoster (Shingles) Vaccine  Completed   HPV Vaccine  Aged Out   Meningitis B Vaccine  Aged Out    Advanced directives: (Copy Requested) Please bring a copy of your health care power of attorney and living will to the office to be added to your chart at your convenience. You can mail to [facility] [address] [place], [place] [address] or email to [email or web address] Advance Care Planning is important because it:  [x]  Makes sure you receive the medical care that is consistent with your values, goals, and preferences  [x]  It provides guidance to your family and loved ones and reduces their decisional burden about whether or  not they are making the right decisions based on your wishes.  Follow the link provided in your after visit summary or read over the paperwork we have mailed to you to help you started getting your Advance Directives in place. If you need assistance in completing these, please reach out to us  so that we can help you!

## 2024-03-30 NOTE — Progress Notes (Signed)
 Subjective:  Please attest and cosign this visit due to patients primary care provider not being in the office at the time the visit was completed.  (Pt of Dr Bambi Lever)   Brandon Nielsen is a 68 y.o. who presents for a Medicare Wellness preventive visit.  As a reminder, Annual Wellness Visits don't include a physical exam, and some assessments may be limited, especially if this visit is performed virtually. We may recommend an in-person follow-up visit with your provider if needed.  Visit Complete: Virtual I connected with  Brandon Nielsen on 03/30/24 by a audio enabled telemedicine application and verified that I am speaking with the correct person using two identifiers.  Patient Location: Skilled Nursing Facility  Provider Location: Office/Clinic  I discussed the limitations of evaluation and management by telemedicine. The patient expressed understanding and agreed to proceed.  Vital Signs: Because this visit was a virtual/telehealth visit, some criteria may be missing or patient reported. Any vitals not documented were not able to be obtained and vitals that have been documented are patient reported.  VideoDeclined- This patient declined Librarian, academic. Therefore the visit was completed with audio only.  Persons Participating in Visit: Patient assisted by Spouse, Vallery Gavel.  AWV Questionnaire: No: Patient Medicare AWV questionnaire was not completed prior to this visit.  Cardiac Risk Factors include: advanced age (>30men, >36 women);Other (see comment);male gender, Risk factor comments: Early Alzheimers     Objective:    Today's Vitals   03/30/24 0913  Weight: 219 lb (99.3 kg)  Height: 6' 3.5 (1.918 m)   Body mass index is 27.01 kg/m.     03/30/2024    9:13 AM 03/02/2024    1:09 PM 12/11/2023    6:55 AM 11/14/2023   10:04 AM 09/26/2023   11:32 AM 04/22/2023   10:01 AM 03/17/2023    9:10 AM  Advanced Directives  Does  Patient Have a Medical Advance Directive? Yes Yes Unable to assess, patient is non-responsive or altered mental status Yes Yes Yes Yes  Type of Advance Directive Healthcare Power of [place];Living will   Healthcare Power of [place];Living will Healthcare Power of [place];Living will Healthcare Power of [place];Living will;Out of facility DNR (pink MOST or yellow form) Healthcare Power of [place];Living will  Does patient want to make changes to medical advance directive?       No - Patient declined  Copy of Healthcare Power of Attorney in Chart? No - copy requested      No - copy requested    Current Medications (verified) Outpatient Encounter Medications as of 03/30/2024  Medication Sig   acetaminophen  (TYLENOL ) 325 MG tablet Take 650 mg by mouth every 6 (six) hours as needed.   aspirin  EC 81 MG tablet Take 1 tablet (81 mg total) by mouth daily. Swallow whole.   busPIRone  (BUSPAR ) 15 MG tablet Take 15 mg by mouth 2 (two) times daily.   escitalopram  (LEXAPRO ) 20 MG tablet Take 1 and 1/2 tablets daily   famotidine  (PEPCID ) 20 MG tablet Take 1 tablet (20 mg total) by mouth daily.   ipratropium (ATROVENT ) 0.06 % nasal spray INSTILL ONE SPRAY IN EACH NOSTRIL FOUR TIMES DAILY   loperamide (IMODIUM) 2 MG capsule Take 2 mg by mouth as needed for diarrhea or loose stools.   LORazepam (ATIVAN) 0.5 MG tablet Take 0.5 mg by mouth every 8 (eight) hours as needed.   LORazepam (ATIVAN) 1 MG tablet Take 1 mg by mouth daily as needed.  polyethylene glycol (MIRALAX / GLYCOLAX) 17 g packet Take 17 g by mouth daily.   prednisoLONE acetate (PRED FORTE) 1 % ophthalmic suspension Place 1 drop into both eyes every 4 (four) hours.   psyllium (METAMUCIL) 58.6 % packet Take 1 packet by mouth daily.   traZODone  (DESYREL ) 50 MG tablet Take 1/2 tablet every night for 1 week, then increase to 1 tablet every night   No facility-administered encounter medications on file as of 03/30/2024.    Allergies  (verified) Patient has no known allergies.   History: Past Medical History:  Diagnosis Date   Aneurysm of the ascending aorta, without rupture (HCC)    Aortic root dilation (HCC)    Chicken pox    as a child   Dementia (HCC)    Fuch's endothelial dystrophy    GERD (gastroesophageal reflux disease)    off meds - after loosing weight   H/O infectious mononucleosis    in college   Hepatitis    Past Surgical History:  Procedure Laterality Date   COLONOSCOPY     CORNEAL TRANSPLANT  2014   Bil eyes   NASAL SEPTUM SURGERY     at 30 yrs of age   Family History  Problem Relation Age of Onset   Cancer Mother        cervical cancer/non-hodgikins lymphoma   Hypertension Mother    Colon polyps Mother    Heart disease Father    Hypertension Father    Social History   Socioeconomic History   Marital status: Married    Spouse name: Not on file   Number of children: 3   Years of education: 19   Highest education level: Not on file  Occupational History   Occupation: Scientist, 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 Married '77- 13/divorced. Married '95.  3 adult children - '82, '85, '86 (step daughter). Work - TEFL teacher.    Left handed    One story   Hot tea and coffee             Social Drivers of Health   Financial Resource Strain: Low Risk  (03/30/2024)   Overall Financial Resource Strain (CARDIA)    Difficulty of Paying Living Expenses: Not hard at all  Food Insecurity: No Food Insecurity (03/30/2024)   Hunger Vital Sign    Worried About Running Out of Food in the Last Year: Never true    Ran Out of  Food in the Last Year: Never true  Transportation Needs: No Transportation Needs (03/30/2024)   PRAPARE - Administrator, Civil Service (Medical): No    Lack of Transportation (Non-Medical): No  Physical Activity: Sufficiently Active (03/30/2024)   Exercise Vital Sign    Days of Exercise per Week: 7 days    Minutes of Exercise per Session: 30 min  Stress: No Stress Concern Present (03/30/2024)   [employer] of Occupational Health - Occupational Stress Questionnaire    Feeling of Stress: Not at all  Social Connections: Moderately Isolated (03/30/2024)   Social Connection and  Isolation Panel    Frequency of Communication with Friends and Family: More than three times a week    Frequency of Social Gatherings with Friends and Family: Twice a week    Attends Religious Services: Never    Database administrator or Organizations: No    Attends Engineer, structural: Never    Marital Status: Married    Tobacco Counseling Counseling given: No    Clinical Intake:  Pre-visit preparation completed: Yes  Pain : No/denies pain     BMI - recorded: 27.01 Nutritional Status: BMI 25 -29 Overweight Nutritional Risks: None Diabetes: No  Lab Results  Component Value Date   HGBA1C 5.6 11/14/2023     How often do you need to have someone help you when you read instructions, pamphlets, or other written materials from your doctor or pharmacy?: 5 - Always (Spouse/Children assists)  Interpreter Needed?: No  Information entered by :: Kandy Orris, CMA   Activities of Daily Living     03/30/2024    8:53 AM  In your present state of health, do you have any difficulty performing the following activities:  Hearing? 0  Vision? 0  Difficulty concentrating or making decisions? 1  Comment Dx-Early Alzheimers  Walking or climbing stairs? 0  Dressing or bathing? 1  Comment Caregiver assists sometimes  Doing errands, shopping? 1  Comment resides at Assisted Living  facility  Preparing Food and eating ? N  Using the Toilet? Y  Comment Caregiver assists sometimes  In the past six months, have you accidently leaked urine? Y  Comment wears depends  Do you have problems with loss of bowel control? N  Managing your Medications? Y  Comment Caregiver/Nurse assists  Managing your Finances? Y  Comment Spouse handles  Housekeeping or managing your Housekeeping? Y  Comment resides at an Assisted Living Facility/Caregiver assists    Patient Care Team: Adelia Homestead, MD as PCP - General (Internal Medicine) Jann Melody, MD as PCP - Cardiology (Cardiology) Zula Hitch, MD (Ophthalmology) Jhonny Moss, MD as Consulting Physician (Neurology)  I have updated your Care Teams any recent Medical Services you may have received from other providers in the past year.     Assessment:   This is a routine wellness examination for [facility].  Hearing/Vision screen Hearing Screening - Comments:: Denies hearing difficulties   Vision Screening - Comments:: Wears rx glasses - appt in 04/2024 w/Dr Joanne Muckle   Goals Addressed               This Visit's Progress     Patient Stated (pt-stated)        Spouse stated that he's staying active and walking daily and continue interacting with others       Depression Screen     03/30/2024    9:17 AM 03/17/2023    9:10 AM 12/31/2022    4:11 PM 08/27/2021   10:16 AM 08/19/2018    9:13 AM 07/08/2017    4:24 PM  PHQ 2/9 Scores  PHQ - 2 Score 0 0 0 0 0 0  PHQ- 9 Score 3  0       Fall Risk     03/30/2024    9:20 AM 03/02/2024    1:09 PM 11/14/2023   10:04 AM 09/26/2023   11:32 AM 04/22/2023   10:00 AM  Fall Risk   Falls in the past year? 1 0 0 0 1  Number falls in past yr: 1 0 0 0  0  Comment 2      Injury with Fall? 0 0 0 0 0  Risk for fall due to : History of fall(s);Mental status change;Impaired balance/gait      Risk for fall due to: Comment Dx-Early Alzheimers      Follow up Falls evaluation  completed;Falls prevention discussed Falls evaluation completed Falls evaluation completed Falls evaluation completed Falls evaluation completed    MEDICARE RISK AT HOME:  Medicare Risk at Home Any stairs in or around the home?: No If so, are there any without handrails?: No Home free of loose throw rugs in walkways, pet beds, electrical cords, etc?: Yes Adequate lighting in your home to reduce risk of falls?: Yes Life alert?: Yes (resides at an Assisted Living Facility) Use of a cane, walker or w/c?: No Grab bars in the bathroom?: Yes Shower chair or bench in shower?: Yes Elevated toilet seat or a handicapped toilet?: Yes  TIMED UP AND GO:  Was the test performed?  No  Cognitive Function: Impaired: Patient has current diagnosis of cognitive impairment.    03/30/2024    9:21 AM 04/22/2023   10:00 AM 10/11/2022    9:00 AM 02/27/2022    9:00 AM  MMSE - Mini Mental State Exam  Not completed: Unable to complete     Orientation to time  1 4 4   Orientation to Place  4 4 4   Registration  3 3 3   Attention/ Calculation  1 2 0  Recall  0 0 3  Language- name 2 objects  1 1 2   Language- repeat  1 1 1   Language- follow 3 step command  2 2 2   Language- read & follow direction  1 1 1   Write a sentence  0 0 0  Copy design  0 0 0  Total score  14 18 20       03/02/2021   10:00 AM 12/27/2019   11:00 AM  Montreal Cognitive Assessment   Visuospatial/ Executive (0/5) 0 0  Naming (0/3) 3 3  Attention: Read list of digits (0/2) 2 2  Attention: Read list of letters (0/1) 0 1  Attention: Serial 7 subtraction starting at 100 (0/3) 1 1  Language: Repeat phrase (0/2) 2 2  Language : Fluency (0/1) 0 0  Abstraction (0/2) 2 1  Delayed Recall (0/5) 0 2  Orientation (0/6) 6 6  Total 16 18  Adjusted Score (based on education) 16 18      Immunizations Immunization History  Administered Date(s) Administered   Fluad Quad(high Dose 65+) 07/27/2021, 07/10/2022   Influenza,inj,Quad PF,6+ Mos  08/18/2014, 10/10/2015, 09/04/2016, 07/08/2017, 08/06/2018, 08/19/2019   Influenza-Unspecified 09/13/2020   PFIZER(Purple Top)SARS-COV-2 Vaccination 11/22/2019, 12/16/2019   PNEUMOCOCCAL CONJUGATE-20 08/27/2021   Tdap 11/05/2012   Zoster Recombinant(Shingrix) 09/15/2017, 11/17/2017, 12/04/2017   Zoster, Live 04/22/2016    Screening Tests Health Maintenance  Topic Date Due   DTaP/Tdap/Td (2 - Td or Tdap) 11/05/2022   COVID-19 Vaccine (3 - 2024-25 season) 06/15/2023   INFLUENZA VACCINE  05/14/2024   Colonoscopy  08/25/2024   Medicare Annual Wellness (AWV)  03/30/2025   Pneumococcal Vaccine: 50+ Years  Completed   Hepatitis C Screening  Completed   Zoster Vaccines- Shingrix  Completed   HPV VACCINES  Aged Out   Meningococcal B Vaccine  Aged Out    Health Maintenance  Health Maintenance Due  Topic Date Due   DTaP/Tdap/Td (2 - Td or Tdap) 11/05/2022   COVID-19 Vaccine (3 - 2024-25 season) 06/15/2023  Health Maintenance Items Addressed: 03/30/2024   Additional Screening:  Vision Screening: Recommended annual ophthalmology exams for early detection of glaucoma and other disorders of the eye. Would you like a referral to an eye doctor? No  Spouse stated the pt has an appt w/Dr Joanne Muckle in 04/2024.  Dental Screening: Recommended annual dental exams for proper oral hygiene  Community Resource Referral / Chronic Care Management: CRR required this visit?  No   CCM required this visit?  No   Plan:    I have personally reviewed and noted the following in the patient's chart:   Medical and social history Use of alcohol, tobacco or illicit drugs  Current medications and supplements including opioid prescriptions. Patient is not currently taking opioid prescriptions. Functional ability and status Nutritional status Physical activity Advanced directives List of other physicians Hospitalizations, surgeries, and ER visits in previous 12 months Vitals Screenings to include  cognitive, depression, and falls Referrals and appointments  In addition, I have reviewed and discussed with patient certain preventive protocols, quality metrics, and best practice recommendations. A written personalized care plan for preventive services as well as general preventive health recommendations were provided to patient.   Patria Bookbinder, CMA   03/30/2024   After Visit Summary: (MyChart) Due to this being a telephonic visit, the after visit summary with patients personalized plan was offered to patient via MyChart   Notes: Nothing significant to report at this time.

## 2024-04-07 ENCOUNTER — Ambulatory Visit
Admission: RE | Admit: 2024-04-07 | Discharge: 2024-04-07 | Disposition: A | Discharge status: Home / Self Care | Source: Ambulatory Visit | Attending: Family Medicine

## 2024-04-07 DIAGNOSIS — R2681 Unsteadiness on feet: Secondary | ICD-10-CM

## 2024-04-07 DIAGNOSIS — R42 Dizziness and giddiness: Secondary | ICD-10-CM

## 2024-04-14 ENCOUNTER — Ambulatory Visit (INDEPENDENT_AMBULATORY_CARE_PROVIDER_SITE_OTHER): Admitting: Otolaryngology

## 2024-04-27 ENCOUNTER — Emergency Department (HOSPITAL_COMMUNITY)
Admission: EM | Admit: 2024-04-27 | Discharge: 2024-04-27 | Disposition: A | Discharge status: Home / Self Care | Attending: Emergency Medicine | Admitting: Emergency Medicine

## 2024-04-27 ENCOUNTER — Other Ambulatory Visit: Payer: Self-pay

## 2024-04-27 DIAGNOSIS — F03911 Unspecified dementia, unspecified severity, with agitation: Secondary | ICD-10-CM | POA: Insufficient documentation

## 2024-04-27 DIAGNOSIS — Z7982 Long term (current) use of aspirin: Secondary | ICD-10-CM | POA: Diagnosis not present

## 2024-04-27 DIAGNOSIS — R4182 Altered mental status, unspecified: Secondary | ICD-10-CM | POA: Insufficient documentation

## 2024-04-27 LAB — CBC WITH DIFFERENTIAL/PLATELET
Abs Immature Granulocytes: 0.16 K/uL — ABNORMAL HIGH (ref 0.00–0.07)
Basophils Absolute: 0.1 K/uL (ref 0.0–0.1)
Basophils Relative: 1 %
Eosinophils Absolute: 0.1 K/uL (ref 0.0–0.5)
Eosinophils Relative: 1 %
HCT: 39.7 % (ref 39.0–52.0)
Hemoglobin: 13.4 g/dL (ref 13.0–17.0)
Immature Granulocytes: 2 %
Lymphocytes Relative: 26 %
Lymphs Abs: 2.2 K/uL (ref 0.7–4.0)
MCH: 29.8 pg (ref 26.0–34.0)
MCHC: 33.8 g/dL (ref 30.0–36.0)
MCV: 88.2 fL (ref 80.0–100.0)
Monocytes Absolute: 0.6 K/uL (ref 0.1–1.0)
Monocytes Relative: 7 %
Neutro Abs: 5.4 K/uL (ref 1.7–7.7)
Neutrophils Relative %: 63 %
Platelets: 337 K/uL (ref 150–400)
RBC: 4.5 MIL/uL (ref 4.22–5.81)
RDW: 12.5 % (ref 11.5–15.5)
WBC: 8.4 K/uL (ref 4.0–10.5)
nRBC: 0 % (ref 0.0–0.2)

## 2024-04-27 LAB — URINALYSIS, W/ REFLEX TO CULTURE (INFECTION SUSPECTED)
Bacteria, UA: NONE SEEN
Bilirubin Urine: NEGATIVE
Glucose, UA: NEGATIVE mg/dL
Hgb urine dipstick: NEGATIVE
Ketones, ur: NEGATIVE mg/dL
Leukocytes,Ua: NEGATIVE
Nitrite: NEGATIVE
Protein, ur: NEGATIVE mg/dL
Specific Gravity, Urine: 1.01 (ref 1.005–1.030)
pH: 8 (ref 5.0–8.0)

## 2024-04-27 LAB — BASIC METABOLIC PANEL WITH GFR
Anion gap: 8 (ref 5–15)
BUN: 11 mg/dL (ref 8–23)
CO2: 25 mmol/L (ref 22–32)
Calcium: 9.1 mg/dL (ref 8.9–10.3)
Chloride: 105 mmol/L (ref 98–111)
Creatinine, Ser: 0.96 mg/dL (ref 0.61–1.24)
GFR, Estimated: >60 mL/min (ref >60)
Glucose, Bld: 91 mg/dL (ref 70–99)
Potassium: 3.8 mmol/L (ref 3.5–5.1)
Sodium: 138 mmol/L (ref 135–145)
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
## 2024-06-01 ENCOUNTER — Ambulatory Visit (HOSPITAL_COMMUNITY)
Admission: RE | Admit: 2024-06-01 | Discharge: 2024-06-01 | Disposition: A | Discharge status: Home / Self Care | Source: Ambulatory Visit | Attending: Internal Medicine | Admitting: Internal Medicine

## 2024-06-01 DIAGNOSIS — I77810 Thoracic aortic ectasia: Secondary | ICD-10-CM | POA: Diagnosis present

## 2024-06-01 DIAGNOSIS — I7121 Aneurysm of the ascending aorta, without rupture: Secondary | ICD-10-CM | POA: Diagnosis present

## 2024-06-01 DIAGNOSIS — I517 Cardiomegaly: Secondary | ICD-10-CM | POA: Diagnosis not present

## 2024-06-01 DIAGNOSIS — I4819 Other persistent atrial fibrillation: Secondary | ICD-10-CM | POA: Insufficient documentation

## 2024-06-01 LAB — ECHOCARDIOGRAM COMPLETE
AR max vel: 2.76 cm2
AV Area VTI: 2.54 cm2
AV Area mean vel: 2.76 cm2
AV Mean grad: 4 mmHg
AV Peak grad: 6.9 mmHg
Ao pk vel: 1.31 m/s
Area-P 1/2: 4.6 cm2
S' Lateral: 3.31 cm

## 2024-06-16 ENCOUNTER — Encounter (INDEPENDENT_AMBULATORY_CARE_PROVIDER_SITE_OTHER): Payer: Self-pay | Admitting: Otolaryngology

## 2024-06-16 ENCOUNTER — Ambulatory Visit (INDEPENDENT_AMBULATORY_CARE_PROVIDER_SITE_OTHER): Admitting: Otolaryngology

## 2024-06-16 VITALS — BP 114/73 | HR 69

## 2024-06-16 DIAGNOSIS — H6123 Impacted cerumen, bilateral: Secondary | ICD-10-CM

## 2024-06-16 NOTE — Progress Notes (Signed)
 Patient ID: [name], male   DOB: [birth year], 69 y.o.   MRN: [id number]  Procedure: Bilateral cerumen disimpaction.   Indication: Cerumen impaction, resulting in ear discomfort and conductive hearing loss.   Description: The patient is placed supine on the operating table. Under the operating microscope, the right ear canal is examined and is noted to be impacted with cerumen. The cerumen is carefully removed with a combination of suction catheters, cerumen curette, and alligator forceps. After the cerumen removal, the ear canal and tympanic membrane are noted to be normal. No middle ear effusion is noted. The same procedure is then repeated on the left side without exception. The patient tolerated the procedure well.  Follow-up care:  The patient is instructed not to use Q-tips to clean the ear canals. The patient will follow up in 5 months.

## 2024-06-21 ENCOUNTER — Telehealth: Payer: Self-pay | Admitting: Neurology

## 2024-06-21 ENCOUNTER — Encounter: Payer: Self-pay | Admitting: Neurology
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
## 2024-06-21 NOTE — Telephone Encounter (Signed)
 Pt wife called and LM with AN. She would like to make an emergency appt for her husband. She would ike to talk with a nurse

## 2024-06-21 NOTE — Telephone Encounter (Signed)
 Pt.s wife says Pt has been admitted to hospital with psychotic episode, would like a contact bk

## 2024-08-24 ENCOUNTER — Encounter: Payer: Self-pay | Admitting: Neurology

## 2024-10-22 ENCOUNTER — Ambulatory Visit: Admitting: Neurology

## 2024-10-30 ENCOUNTER — Encounter: Payer: Self-pay | Admitting: Internal Medicine

## 2024-11-19 ENCOUNTER — Ambulatory Visit (INDEPENDENT_AMBULATORY_CARE_PROVIDER_SITE_OTHER): Admitting: Otolaryngology

## 2025-04-05 ENCOUNTER — Ambulatory Visit
